# Patient Record
Sex: Male | Born: 1945 | Race: White | Hispanic: No | Smoking: Former smoker
Health system: Southern US, Community
[De-identification: ages and names within clinical notes are randomized; demographics above are authoritative.]

## PROBLEM LIST (undated history)

## (undated) DIAGNOSIS — I2510 Atherosclerotic heart disease of native coronary artery without angina pectoris: Secondary | ICD-10-CM

## (undated) DIAGNOSIS — I509 Heart failure, unspecified: Secondary | ICD-10-CM

## (undated) DIAGNOSIS — I513 Intracardiac thrombosis, not elsewhere classified: Secondary | ICD-10-CM

## (undated) DIAGNOSIS — I10 Essential (primary) hypertension: Secondary | ICD-10-CM

## (undated) DIAGNOSIS — I639 Cerebral infarction, unspecified: Secondary | ICD-10-CM

## (undated) DIAGNOSIS — D509 Iron deficiency anemia, unspecified: Secondary | ICD-10-CM

## (undated) DIAGNOSIS — N189 Chronic kidney disease, unspecified: Secondary | ICD-10-CM

## (undated) DIAGNOSIS — I255 Ischemic cardiomyopathy: Secondary | ICD-10-CM

## (undated) DIAGNOSIS — E7800 Pure hypercholesterolemia, unspecified: Secondary | ICD-10-CM

## (undated) DIAGNOSIS — I4891 Unspecified atrial fibrillation: Principal | ICD-10-CM

## (undated) HISTORY — DX: Unspecified atrial fibrillation: I48.91

## (undated) HISTORY — DX: Essential (primary) hypertension: I10

## (undated) HISTORY — PX: HERNIA REPAIR: SHX51

## (undated) HISTORY — DX: Cerebral infarction, unspecified: I63.9

## (undated) HISTORY — DX: Pure hypercholesterolemia, unspecified: E78.00

## (undated) HISTORY — PX: MASTOIDECTOMY: SHX711

## (undated) HISTORY — DX: Atherosclerotic heart disease of native coronary artery without angina pectoris: I25.10

## (undated) HISTORY — DX: Chronic kidney disease, unspecified: N18.9

## (undated) HISTORY — PX: TONSILLECTOMY: SUR1361

## (undated) HISTORY — DX: Ischemic cardiomyopathy: I25.5

---

## 1997-02-11 HISTORY — PX: CHOLECYSTECTOMY: SHX55

## 1998-02-11 HISTORY — PX: CORONARY ANGIOPLASTY WITH STENT PLACEMENT: SHX49

## 1998-02-16 ENCOUNTER — Ambulatory Visit (HOSPITAL_COMMUNITY): Admission: RE | Admit: 1998-02-16 | Discharge: 1998-02-16 | Payer: Self-pay | Admitting: Cardiology

## 1998-02-16 ENCOUNTER — Encounter: Payer: Self-pay | Admitting: Cardiology

## 1998-04-03 ENCOUNTER — Observation Stay (HOSPITAL_COMMUNITY): Admission: AD | Admit: 1998-04-03 | Discharge: 1998-04-04 | Payer: Self-pay | Admitting: Cardiovascular Disease

## 1998-09-19 ENCOUNTER — Encounter: Payer: Self-pay | Admitting: Emergency Medicine

## 1998-09-19 ENCOUNTER — Emergency Department (HOSPITAL_COMMUNITY): Admission: EM | Admit: 1998-09-19 | Discharge: 1998-09-19 | Payer: Self-pay | Admitting: Emergency Medicine

## 1998-10-20 ENCOUNTER — Observation Stay (HOSPITAL_COMMUNITY): Admission: RE | Admit: 1998-10-20 | Discharge: 1998-10-20 | Payer: Self-pay | Admitting: General Surgery

## 1998-10-20 ENCOUNTER — Encounter (INDEPENDENT_AMBULATORY_CARE_PROVIDER_SITE_OTHER): Payer: Self-pay | Admitting: Specialist

## 2000-04-11 HISTORY — PX: CORONARY ANGIOPLASTY WITH STENT PLACEMENT: SHX49

## 2000-09-18 ENCOUNTER — Encounter: Payer: Self-pay | Admitting: Orthopedic Surgery

## 2000-09-18 ENCOUNTER — Ambulatory Visit: Admission: RE | Admit: 2000-09-18 | Discharge: 2000-09-18 | Payer: Self-pay | Admitting: Orthopedic Surgery

## 2002-06-20 ENCOUNTER — Encounter: Payer: Self-pay | Admitting: Emergency Medicine

## 2002-06-20 ENCOUNTER — Emergency Department (HOSPITAL_COMMUNITY): Admission: EM | Admit: 2002-06-20 | Discharge: 2002-06-20 | Payer: Self-pay | Admitting: Emergency Medicine

## 2003-03-17 ENCOUNTER — Ambulatory Visit (HOSPITAL_COMMUNITY): Admission: RE | Admit: 2003-03-17 | Discharge: 2003-03-17 | Payer: Self-pay | Admitting: Cardiology

## 2003-03-25 ENCOUNTER — Ambulatory Visit (HOSPITAL_COMMUNITY): Admission: RE | Admit: 2003-03-25 | Discharge: 2003-03-25 | Payer: Self-pay | Admitting: Cardiology

## 2005-12-25 ENCOUNTER — Ambulatory Visit: Payer: Self-pay | Admitting: Family Medicine

## 2006-08-29 ENCOUNTER — Inpatient Hospital Stay (HOSPITAL_COMMUNITY): Admission: EM | Admit: 2006-08-29 | Discharge: 2006-08-31 | Payer: Self-pay | Admitting: Emergency Medicine

## 2006-11-26 DIAGNOSIS — I10 Essential (primary) hypertension: Secondary | ICD-10-CM | POA: Insufficient documentation

## 2007-02-12 HISTORY — PX: OTHER SURGICAL HISTORY: SHX169

## 2007-02-18 ENCOUNTER — Encounter (INDEPENDENT_AMBULATORY_CARE_PROVIDER_SITE_OTHER): Payer: Self-pay | Admitting: General Surgery

## 2007-02-18 ENCOUNTER — Ambulatory Visit (HOSPITAL_COMMUNITY): Admission: RE | Admit: 2007-02-18 | Discharge: 2007-02-18 | Payer: Self-pay | Admitting: General Surgery

## 2009-11-29 ENCOUNTER — Ambulatory Visit: Payer: Self-pay | Admitting: Cardiology

## 2010-03-26 ENCOUNTER — Ambulatory Visit (INDEPENDENT_AMBULATORY_CARE_PROVIDER_SITE_OTHER): Payer: Medicare Other | Admitting: Cardiology

## 2010-03-26 DIAGNOSIS — I2510 Atherosclerotic heart disease of native coronary artery without angina pectoris: Secondary | ICD-10-CM

## 2010-03-26 DIAGNOSIS — I209 Angina pectoris, unspecified: Secondary | ICD-10-CM

## 2010-03-26 DIAGNOSIS — I10 Essential (primary) hypertension: Secondary | ICD-10-CM

## 2010-03-29 ENCOUNTER — Ambulatory Visit
Admission: RE | Admit: 2010-03-29 | Discharge: 2010-03-29 | Disposition: A | Discharge status: Home / Self Care | Payer: Medicare Other | Source: Ambulatory Visit | Attending: Cardiology | Admitting: Cardiology

## 2010-03-29 ENCOUNTER — Other Ambulatory Visit (INDEPENDENT_AMBULATORY_CARE_PROVIDER_SITE_OTHER): Payer: Medicare Other

## 2010-03-29 ENCOUNTER — Other Ambulatory Visit: Payer: Self-pay | Admitting: Cardiology

## 2010-03-29 DIAGNOSIS — E789 Disorder of lipoprotein metabolism, unspecified: Secondary | ICD-10-CM

## 2010-03-29 DIAGNOSIS — I6350 Cerebral infarction due to unspecified occlusion or stenosis of unspecified cerebral artery: Secondary | ICD-10-CM

## 2010-03-29 DIAGNOSIS — I2510 Atherosclerotic heart disease of native coronary artery without angina pectoris: Secondary | ICD-10-CM

## 2010-03-29 DIAGNOSIS — I10 Essential (primary) hypertension: Secondary | ICD-10-CM

## 2010-03-29 DIAGNOSIS — Z01811 Encounter for preprocedural respiratory examination: Secondary | ICD-10-CM

## 2010-04-03 ENCOUNTER — Observation Stay (HOSPITAL_COMMUNITY)
Admission: RE | Admit: 2010-04-03 | Discharge: 2010-04-03 | Disposition: A | Discharge status: Home / Self Care | Payer: Medicare Other | Source: Ambulatory Visit | Attending: Cardiology | Admitting: Cardiology

## 2010-04-03 DIAGNOSIS — I2510 Atherosclerotic heart disease of native coronary artery without angina pectoris: Principal | ICD-10-CM | POA: Insufficient documentation

## 2010-04-03 DIAGNOSIS — Z9861 Coronary angioplasty status: Secondary | ICD-10-CM | POA: Insufficient documentation

## 2010-04-03 DIAGNOSIS — I209 Angina pectoris, unspecified: Secondary | ICD-10-CM | POA: Insufficient documentation

## 2010-04-11 ENCOUNTER — Ambulatory Visit (INDEPENDENT_AMBULATORY_CARE_PROVIDER_SITE_OTHER): Payer: Medicare Other | Admitting: Cardiology

## 2010-04-11 DIAGNOSIS — I2510 Atherosclerotic heart disease of native coronary artery without angina pectoris: Secondary | ICD-10-CM

## 2010-04-11 DIAGNOSIS — I10 Essential (primary) hypertension: Secondary | ICD-10-CM

## 2010-04-11 DIAGNOSIS — I209 Angina pectoris, unspecified: Secondary | ICD-10-CM

## 2010-04-12 HISTORY — PX: CORONARY ANGIOPLASTY WITH STENT PLACEMENT: SHX49

## 2010-04-26 ENCOUNTER — Other Ambulatory Visit (INDEPENDENT_AMBULATORY_CARE_PROVIDER_SITE_OTHER): Payer: Medicare Other

## 2010-04-26 DIAGNOSIS — E7800 Pure hypercholesterolemia, unspecified: Secondary | ICD-10-CM

## 2010-04-26 NOTE — Procedures (Signed)
  NAMEMATTY, VANROEKEL               ACCOUNT NO.:  [id number]  MEDICAL RECORD NO.:  [id number]           PATIENT TYPE:  I  LOCATION:  6522                         FACILITY:  [facility]  PHYSICIAN:  Rollene Rotunda, MD, FACCDATE OF BIRTH:  Dec 04, 1945  DATE OF PROCEDURE:  04/03/2010 DATE OF DISCHARGE:  04/03/2010                           CARDIAC CATHETERIZATION   PRIMARY CARE PHYSICIAN:  None.  CARDIOLOGIST:  Peter M. Sw[place]iland, MD  PROCEDURE:  Left heart catheterization/coronary arteriography.  INDICATIONS:  Evaluate patient with exertional angina and known previous coronary artery disease.  PROCEDURE NOTE:  Left heart catheterization was performed via right radial artery, the vessels cannulated using an antral wall puncture.  A 5-French radial sheath was inserted via the modified Seldinger technique.  Heparin 4000 was administered.  Verapamil 3 mg was utilized. Judkins catheters and preformed pigtail were utilized.  After the procedure, radial band was applied with 14 mL of air at 11:27.  The patient tolerated the procedure well and left lab in stable condition.  HEMODYNAMIC RESULTS:  LV 152/14, AO 151/81.  CORONARIES:  Left main had luminal irregularities.  The LAD had a proximal stent.  Prior to the stent, there were diffuse 25% lesions.  In the mid stent, there was a 90% stenosis.  The LAD was a large vessel wrapping the apex.  First diagonal was small with diffuse 80% lesions. Second diagonal was small and normal.  The ramus intermediate was moderate sized to large with long mid 80% stenosis.  The circumflex in the AV groove had luminal irregularities.  First obtuse marginal was moderate sized with diffuse luminal irregularities.  Second obtuse marginal was moderate-sized with diffuse mid to distal 80-90% stenosis. The right coronary artery was a dominant vessel.  There were mild distal luminal irregularities before the PDA.  There was posterolateral which was small with diffuse  80-90% stenosis.  PDA was moderate sized with luminal irregularities.  LEFT VENTRICULOGRAM:  The left ventriculogram was obtained in the RAO projection.  The EF was 65% with normal wall motion.  CONCLUSION:  Large vessel coronary artery disease involving predominantly the ramus intermediate and the left anterior descending. He also has some small-vessel disease involving diagonal and posterolateral and obtuse marginal.  His ejection fraction is well preserved.  PLAN:  Dr. Swaziland will be talking to the patient about bypass surgery versus PCI.     Rollene Rotunda, MD, [facility]     JH/MEDQ  D:  04/03/2010  T:  04/04/2010  Job:  [id number]  Electronically Signed by Rollene Rotunda MD [facility] on 04/26/2010 07:56:40 PM

## 2010-05-01 ENCOUNTER — Observation Stay (HOSPITAL_COMMUNITY)
Admission: RE | Admit: 2010-05-01 | Discharge: 2010-05-02 | Disposition: A | Discharge status: Home / Self Care | Payer: Medicare Other | Source: Ambulatory Visit | Attending: Cardiology | Admitting: Cardiology

## 2010-05-01 DIAGNOSIS — I209 Angina pectoris, unspecified: Secondary | ICD-10-CM | POA: Insufficient documentation

## 2010-05-01 DIAGNOSIS — Z8673 Personal history of transient ischemic attack (TIA), and cerebral infarction without residual deficits: Secondary | ICD-10-CM | POA: Insufficient documentation

## 2010-05-01 DIAGNOSIS — N183 Chronic kidney disease, stage 3 unspecified: Secondary | ICD-10-CM | POA: Insufficient documentation

## 2010-05-01 DIAGNOSIS — E785 Hyperlipidemia, unspecified: Secondary | ICD-10-CM | POA: Insufficient documentation

## 2010-05-01 DIAGNOSIS — Y849 Medical procedure, unspecified as the cause of abnormal reaction of the patient, or of later complication, without mention of misadventure at the time of the procedure: Secondary | ICD-10-CM | POA: Insufficient documentation

## 2010-05-01 DIAGNOSIS — Z23 Encounter for immunization: Secondary | ICD-10-CM | POA: Insufficient documentation

## 2010-05-01 DIAGNOSIS — I129 Hypertensive chronic kidney disease with stage 1 through stage 4 chronic kidney disease, or unspecified chronic kidney disease: Secondary | ICD-10-CM | POA: Insufficient documentation

## 2010-05-01 DIAGNOSIS — I2510 Atherosclerotic heart disease of native coronary artery without angina pectoris: Principal | ICD-10-CM | POA: Insufficient documentation

## 2010-05-01 LAB — POCT ACTIVATED CLOTTING TIME: Activated Clotting Time: 541 seconds

## 2010-05-02 DIAGNOSIS — I200 Unstable angina: Secondary | ICD-10-CM

## 2010-05-02 LAB — BASIC METABOLIC PANEL
Calcium: 9.3 mg/dL (ref 8.4–10.5)
Chloride: 102 mEq/L (ref 96–112)
Creatinine, Ser: 1.53 mg/dL — ABNORMAL HIGH (ref 0.4–1.5)
GFR calc Af Amer: 56 mL/min — ABNORMAL LOW (ref >60)
Sodium: 136 mEq/L (ref 135–145)

## 2010-05-02 LAB — CBC
Hemoglobin: 12.6 g/dL — ABNORMAL LOW (ref 13.0–17.0)
MCH: 29.8 pg (ref 26.0–34.0)
MCHC: 33.5 g/dL (ref 30.0–36.0)
Platelets: 165 10*3/uL (ref 150–400)
RBC: 4.23 MIL/uL (ref 4.22–5.81)

## 2010-05-03 NOTE — Procedures (Signed)
NAMEFAUSTO, Erik Adams               ACCOUNT NO.:  [id number]  MEDICAL RECORD NO.:  [id number]           PATIENT TYPE:  O  LOCATION:  6525                         FACILITY:  [facility]  PHYSICIAN:  Imad Shostak M. [place], M.D.  DATE OF BIRTH:  04/30/1945  DATE OF PROCEDURE:  05/01/2010 DATE OF DISCHARGE:                           CARDIAC CATHETERIZATION   INDICATIONS FOR PROCEDURE:  A 65 year old white male with history of coronary artery disease and remote stenting of the mid LAD in 2000 with a bare-metal stent.  He presented recently with increasing anginal symptoms and was found to have significant progressive coronary disease including in-stent restenosis in the mid LAD.  He also had a long 80% to 90% stenosis of the ramus intermediate branch.  The first diagonal was severely and diffusely diseased up to 90%.  It was small in caliber and not felt to be amenable to intervention.  The PDA was also severely diffusely diseased but not amenable to intervention.  After discussion of the patient's options, he elected to undergo percutaneous intervention of the mid LAD and the ramus intermediate branch.  ACCESS:  Via the right radial artery using the standard Seldinger technique.  EQUIPMENT:  A 6-French [place] guide, 6-French arterial sheath, a [place] wire.  For the LAD, we used a 3.0- x 15-mm Sprinter Legend balloon, a 3.5- x 32-mm Ion stent, a 3.75- x 21-mm [place] Sprinter balloon, and a 4.0- x 8-mm [place] TREK balloon.  For the ramus intermediate branch, we used a 6- [place] left Voda 4 guide and a BMW wire.  We also used a 2.5- x 20-mm Sprinter balloon, a 2.5- x 28-mm Ion stent, a 2.5- x 24-mm Ion stent, and a 2.75- x 21-mm [place] Sprinter balloon.  MEDICATIONS:  Local anesthesia with 1% Xylocaine.  The patient was started on sodium bicarbonate drip prior to the intervention.  This was maintained throughout the procedure.  He received Versed 2 mg IV and a total of 100 mcg of fentanyl IV.  Angiomax bolus  at 0.75 mg/kg followed by continuous infusion of 1.75 mg/kg per hour, labetalol 20 mg IV x1.  CONTRAST:  Omnipaque 190 mL.  INTERVENTIONAL PROCEDURE NOTE:  We initially addressed the mid LAD stenosis.  This was a 90% in-stent restenosis in a fairly long segment of the mid LAD.  We were able to cross this lesion easily with the wire and predilated with a 3.0- x 15-mm [place]  Sprinter balloon up to 11 atmospheres.  We then placed a 3.5- x 32-mm Ion stent and deployed this at 11 atmospheres with the stent balloon.  We postdilated the entire stent with a 3.75- x 21-mm [place] Sprinter up to 11 atmospheres distally and 16 atmospheres proximally.  There still appeared to be a small segment proximally that was under deployed and we postdilated this with a 4.0- x 8-mm [place] TREK balloon up to 17 atmospheres.  This yielded an excellent angiographic result with 0% residual stenosis and TIMI grade 3 flow. The first diagonal branch was not compromised.  We next addressed the ramus intermediate branch.  This vessel had a long segment of disease from the  proximal to mid vessel.  It was tortuous. We initially crossed and dilated with the 2.5- x 20-mm [place] Sprinter balloon up to 11 atmospheres.  However, we had inadequate wire and guide support to place a stent.  We switched to a 6-French left Voda 4 guide and a BMW wire.  We recrossed the lesion without difficulty.  We were then able to stent the more distal segment with a 2.5- x 28-mm Ion stent that was deployed at 11 atmospheres.  The more proximal segment was stented with a 2.5- x 24-mm Ion stent also deployed at 11 atmospheres. The entire stented segment was postdilated with a 2.75- x 21-mm [place] Sprinter balloon up to 14 atmospheres.  This yielded an excellent angiographic result with 0% residual stenosis and TIMI grade 3 flow. The patient was pain free at this point.  IMPRESSION:  Successful drug-eluting stent deployment in the mid left anterior descending and  proximal to mid ramus intermediate branches.  PLAN:  We will continue on aspirin and Plavix indefinitely.          ______________________________ Jacobs Golab M. [place], M.D.     PMJ/MEDQ  D:  05/01/2010  T:  05/02/2010  Job:  [id number]  Electronically Signed by Walburga Hudman [place] M.D. on 05/03/2010 03:41:16 PM

## 2010-05-15 ENCOUNTER — Other Ambulatory Visit: Payer: Self-pay | Admitting: Dermatology

## 2010-05-16 ENCOUNTER — Ambulatory Visit: Payer: Medicare Other | Admitting: Nurse Practitioner

## 2010-05-18 ENCOUNTER — Ambulatory Visit: Payer: Medicare Other | Admitting: Nurse Practitioner

## 2010-05-21 ENCOUNTER — Encounter: Payer: Self-pay | Admitting: Nurse Practitioner

## 2010-05-25 ENCOUNTER — Encounter: Payer: Self-pay | Admitting: Nurse Practitioner

## 2010-05-25 ENCOUNTER — Ambulatory Visit (INDEPENDENT_AMBULATORY_CARE_PROVIDER_SITE_OTHER): Payer: Medicare Other | Admitting: Nurse Practitioner

## 2010-05-25 VITALS — BP 126/90 | HR 68 | Wt 177.2 lb

## 2010-05-25 DIAGNOSIS — I2510 Atherosclerotic heart disease of native coronary artery without angina pectoris: Secondary | ICD-10-CM

## 2010-05-25 DIAGNOSIS — N183 Chronic kidney disease, stage 3 unspecified: Secondary | ICD-10-CM | POA: Insufficient documentation

## 2010-05-25 DIAGNOSIS — E7800 Pure hypercholesterolemia, unspecified: Secondary | ICD-10-CM

## 2010-05-25 DIAGNOSIS — I10 Essential (primary) hypertension: Secondary | ICD-10-CM

## 2010-05-25 DIAGNOSIS — N189 Chronic kidney disease, unspecified: Secondary | ICD-10-CM

## 2010-05-25 DIAGNOSIS — Z955 Presence of coronary angioplasty implant and graft: Secondary | ICD-10-CM | POA: Insufficient documentation

## 2010-05-25 DIAGNOSIS — Z95820 Peripheral vascular angioplasty status with implants and grafts: Secondary | ICD-10-CM

## 2010-05-25 DIAGNOSIS — Z9861 Coronary angioplasty status: Secondary | ICD-10-CM

## 2010-05-25 LAB — CBC WITH DIFFERENTIAL/PLATELET
Basophils Absolute: 0 10*3/uL (ref 0.0–0.1)
Basophils Relative: 0.4 % (ref 0.0–3.0)
Eosinophils Absolute: 0.2 10*3/uL (ref 0.0–0.7)
Eosinophils Relative: 3.1 % (ref 0.0–5.0)
HCT: 37 % — ABNORMAL LOW (ref 39.0–52.0)
Hemoglobin: 12.7 g/dL — ABNORMAL LOW (ref 13.0–17.0)
Lymphocytes Relative: 19.2 % (ref 12.0–46.0)
Lymphs Abs: 1.3 10*3/uL (ref 0.7–4.0)
MCHC: 34.3 g/dL (ref 30.0–36.0)
MCV: 92.9 fl (ref 78.0–100.0)
Monocytes Absolute: 0.5 10*3/uL (ref 0.1–1.0)
Monocytes Relative: 7.7 % (ref 3.0–12.0)
Neutro Abs: 4.7 10*3/uL (ref 1.4–7.7)
Neutrophils Relative %: 69.6 % (ref 43.0–77.0)
Platelets: 200 10*3/uL (ref 150.0–400.0)
RBC: 3.98 Mil/uL — ABNORMAL LOW (ref 4.22–5.81)
RDW: 13.7 % (ref 11.5–14.6)
WBC: 6.7 10*3/uL (ref 4.5–10.5)

## 2010-05-25 LAB — BASIC METABOLIC PANEL
BUN: 32 mg/dL — ABNORMAL HIGH (ref 6–23)
CO2: 27 mEq/L (ref 19–32)
Calcium: 9.3 mg/dL (ref 8.4–10.5)
Chloride: 103 mEq/L (ref 96–112)
Creatinine, Ser: 1.7 mg/dL — ABNORMAL HIGH (ref 0.4–1.5)
GFR: 44.33 mL/min — ABNORMAL LOW (ref >60.00)
Glucose, Bld: 97 mg/dL (ref 70–99)
Potassium: 4.9 mEq/L (ref 3.5–5.1)
Sodium: 138 mEq/L (ref 135–145)

## 2010-05-25 NOTE — Assessment & Plan Note (Signed)
He is currently doing well and not having any more chest discomfort. I explained to him that sometimes following PCI there can be some residual chest discomforts. If he were to have recurrent symptoms in the future, he needs to use his NTG and be in touch with [place].

## 2010-05-25 NOTE — Assessment & Plan Note (Signed)
We will recheck  BMET today.

## 2010-05-25 NOTE — Assessment & Plan Note (Signed)
We will keep him on his current regimen.

## 2010-05-25 NOTE — Progress Notes (Signed)
[name] Date of Birth: 1945/04/13   History of Present Illness: Mr. Tursi is seen today for a 2 week follow up visit. He is seen for Dr. [place]il[place]. He has had recent 2 vessel PCI. He remains on Plavix and aspirin. He is now feeling good. He notes that for the first few days after discharge, he had chest soreness. He used a couple of NTG with relief. He did not call and alert anyone. He is now back walking and is almost back to walking his 3 miles. He is not having any chest pain now. He feels good. He is tolerating his medicines. He has CRI and we will recheck his BUN & creatinine today.   Current Outpatient Prescriptions on File Prior to Visit  Medication Sig Dispense Refill  . Amlodipine-Valsartan-HCTZ (EXFORGE HCT) 10-320-25 MG TABS Take by mouth daily.        Marland Kitchen aspirin 325 MG EC tablet Take 325 mg by mouth daily.        . clopidogrel (PLAVIX) 75 MG tablet Take 75 mg by mouth daily.        Marland Kitchen labetalol (NORMODYNE) 200 MG tablet Take 200 mg by mouth. Taking 2 tablets BID       . nitroGLYCERIN (NITROSTAT) 0.4 MG SL tablet Place 0.4 mg under the tongue every 5 (five) minutes as needed.        Marland Kitchen DISCONTD: rosuvastatin (CRESTOR) 20 MG tablet Take 10 mg by mouth daily.         Allergies  Allergen Reactions  . Ace Inhibitors   . Clonidine Derivatives     Past Medical History  Diagnosis Date  . CAD (coronary artery disease)   . HTN (hypertension)   . Hypercholesteremia   . CVA (cerebral infarction)   . CRI (chronic renal insufficiency)     Baseline creatinine of 1.4 to 1.7    Past Surgical History  Procedure Date  . Coronary angioplasty with stent placement 2000    BMS to the LAD  . Mastoidectomy   . Coronary angioplasty with stent placement March 2012    DES to LAD and Ramus intermdius    History  Smoking status  . Former Smoker  . Quit date: 02/11/1974  Smokeless tobacco  . Never Used    History  Alcohol Use No    Family History  Problem Relation Age of Onset    . Heart disease Father     Review of Systems: The review of systems is positive for mild bruising. He has had no problems with his cath site (right arm).  All other systems were reviewed and are negative.  Physical Exam: BP 126/90  Pulse 68  Wt 177 lb 3.2 oz (80.377 kg) Patient is very pleasant and in no acute distress. Skin is warm and dry. Color is normal.  HEENT is unremarkable. Normocephalic/atraumatic. PERRL. Sclera are nonicteric. Neck is supple. No masses. No JVD. Lungs are clear. Cardiac exam shows a regular rate and rhythm. Abdomen is soft. Extremities are without edema. His right arm looks great. Gait and ROM are intact. No gross neurologic deficits noted.   LABORATORY DATA: BMET and CBC are pending.   Assessment / Plan:

## 2010-05-25 NOTE — Patient Instructions (Signed)
Watch your weight. Regular exercise for 45 minutes each day is recommended. Heart Healthy Diet is recommended.  Stay on ;your current medicines. We are going to check your kidney function today.  I will have you see Dr. Swaziland in 6 weeks.  Call for any recurrent chest pain. You may use your NTG if needed.

## 2010-05-25 NOTE — Assessment & Plan Note (Signed)
Samples of his Crestor are given today.

## 2010-05-29 ENCOUNTER — Telehealth: Payer: Self-pay | Admitting: *Deleted

## 2010-05-29 NOTE — Telephone Encounter (Signed)
LM w/ lab results 

## 2010-05-29 NOTE — Discharge Summary (Signed)
Erik Adams, Erik Adams               ACCOUNT NO.:  [id number]  MEDICAL RECORD NO.:  [id number]           PATIENT TYPE:  O  LOCATION:  6525                         FACILITY:  [facility]  PHYSICIAN:  Victora Irby M. [place], M.D.  DATE OF BIRTH:  1945-07-30  DATE OF ADMISSION:  05/01/2010 DATE OF DISCHARGE:  05/02/2010                              DISCHARGE SUMMARY   PRIMARY CARDIOLOGIST:  Karon Heckendorn M. [place], MD  DISCHARGE DIAGNOSIS:  Unstable angina.  SECONDARY DIAGNOSES: 1. Coronary artery disease, status post successful drug-eluting stent     placement of the left anterior descending artery and ramus     intermedius this admission. 2. Hypertension. 3. Hyperlipidemia. 4. Prior cerebrovascular accident. 5. Stage III chronic kidney disease, baseline creatinine 1.4-1.7. 6. Status post mastoidectomy.  ALLERGIES:  Intolerance to CLONIDINE and ACE INHIBITORS.  PROCEDURES:  Successful PCI and stenting of the LAD secondary to in- stent restenosis with placement of 3.5 x 32-mm ION drug-eluting stent. Successful PCI and stenting of the distal ramus intermedius with placement to 0.5 x 28-mm ION drug-eluting stent.  The proximal ramus intermedius was stented with a 2.5 x 24-mm ION drug-eluting stent.  HISTORY OF PRESENT ILLNESS:  A 65 year old male with prior history of coronary artery disease, status post bare-metal stenting of the LAD in 2000.  The patient recently developed exertional angina, was seen by Dr. [place]iland, set up a catheterization which took place on April 04, 2010. This showed significant multivessel disease including in-stent restenosis within the LAD up to approximately 90%, as well as diffuse 80- 90% stenosis throughout a ramus intermedius.  The patient also has significant small vessel disease involving the diagonal, obtuse marginal and RPL.  EF was 65%.  The patient followed up with Dr. [place] in the office on April 11, 2010, and because of ongoing symptoms, decision was made  to pursue PCI of the LAD and ramus.  HOSPITAL COURSE:  The patient was presented to the [facility] on May 01, 2010, where he underwent successful PCI and drug-eluting stent placement within the mid LAD using a 3.5 x 32-mm ION drug-eluting stent.  Attention was then turned to the ramus with placement of 2.5 x 28-mm ION drug-eluting stent in the distal portion of the vessel and a 2.5 x 24-mm ION drug-eluting stent in the proximal ramus intermedius. The patient tolerated this procedure well and postprocedure, has been ambulating without recurrent symptoms or limitations.  He has been hypertensive and has been treated with an additional dose of Exforge this morning.  The plan to discharge him home today in good condition.  DISCHARGE LABS:  Hemoglobin 12.6, hematocrit 37.6, WBC 8.9, platelets 165.  Sodium 136, potassium 3.8, chloride 102, CO2 26, BUN 22, creatinine 1.53, glucose 109, calcium 9.3.  DISPOSITION:  The patient will be discharged home today in good condition.  FOLLOWUP APPOINTMENTS:  We have arranged follow up with Norma Fredrickson, nurse practitioner at [facility] on May 16, 2010 at 10 a.m.  DISCHARGE MEDICATIONS: 1. Nitroglycerin 0.4 mg sublingual p.r.n. chest pain. 2. Labetalol 200 mg two tablets b.i.d. 3. Aspirin 325 mg daily. 4. Crestor 20  mg daily. 5. Exforge HCT 10/320/25 one tablet daily. 6. Plavix 75 mg daily.  OUTSTANDING LABORATORY STUDIES:  None.  DURATION OF DISCHARGE ENCOUNTER:  Forty minutes including physician time.     [name], ANP   ______________________________ Brant Peets M. [place], M.D.    CB/MEDQ  D:  05/02/2010  T:  05/03/2010  Job:  [id number]  Electronically Signed by [name] ANP on 05/22/2010 04:04:43 PM Electronically Signed by Monti Villers [place] M.D. on 05/29/2010 03:13:45 PM

## 2010-05-29 NOTE — Telephone Encounter (Signed)
Called back and gave results of lab work. Sent to Dr. Scotty Court.

## 2010-05-29 NOTE — Telephone Encounter (Signed)
Message copied by Murrell Redden on Tue May 29, 2010  2:21 PM ------      Message from: Norma Fredrickson      Created: Tue May 29, 2010  8:10 AM       Ok to report. Labs are satisfactory. Has chronic renal insufficiency. Avoid NSAIDS. Continue with same medicines.

## 2010-05-29 NOTE — Telephone Encounter (Signed)
Message copied by Murrell Redden on Tue May 29, 2010 10:12 AM ------      Message from: Norma Fredrickson      Created: Tue May 29, 2010  8:10 AM       Ok to report. Labs are satisfactory. Has chronic renal insufficiency. Avoid NSAIDS. Continue with same medicines.

## 2010-06-26 NOTE — Op Note (Signed)
Erik Adams, Erik Adams               ACCOUNT NO.:  [id number]   MEDICAL RECORD NO.:  [id number]          PATIENT TYPE:  AMB   LOCATION:  DAY                           FACILITY:  [facility]   PHYSICIAN:  Barbaraann Barthel, M.D. DATE OF BIRTH:  1945-09-09   DATE OF PROCEDURE:  02/18/2007  DATE OF DISCHARGE:                               OPERATIVE REPORT   SURGEON:  Dr. Malvin Johns.   PREOPERATIVE DIAGNOSES:  Left inguinal hernia.   POSTOPERATIVE DIAGNOSES:  Left inguinal hernia.   PROCEDURE:  Left inguinal herniorrhaphy (no mesh used, modified McVay  repair).   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   Note this is a 65 year old white male who had a moderately sized  left  inguinal hernia which was causing him more and more discomfort. This was  non-incarcerated and we discussed elective repair after cardiac  clearance. Cardiac clearance was obtained in [place] and we had  scheduled elective surgery.  We discussed complications not limited to  but including bleeding, infection and recurrence.  Informed consent was  obtained.   GROSS OPERATIVE FINDINGS:  Consistent with indirect and a direct  inguinal hernia.  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   TECHNIQUE:  The patient was placed in supine position after the adequate  administration of spinal anesthesia.  Prior to prepping a Foley catheter  was aseptically inserted.  After prepping with Betadine solution and  draping in the usual manner, an incision was carried out between the  anterior-superior iliac spine and the pubic tubercle through skin,  subcutaneous tissue, Scarpa's layer and opening the external oblique  through the external ring.  The ilioinguinal nerve was identified.  This  was carefully preserved. The cords structures were then dissected free  from the hernia sac. The hernia sac was then doubly ligated under direct  vision and amputated and we then repaired the direct  defect suturing  transversus fascia and transversalis abdominis to Cooper's ligament and  Poupart's ligament with interrupted 2-0 Bralon sutures.  Prior to  cinching these, a relaxing incision was carried out.  We then used  approximately 10 mL of 1/2% Sensorcaine to help with postoperative  comfort. The cord structures and the ilioinguinal nerve were returned to  their anatomic position and the external oblique was repaired over the  cord structures with a running 3-0 Polysorb suture.  The wound was then  irrigated, we checked for hemostasis.  The patient had a good result.  He coughed, there was a good repair noted.  We then closed the skin with  a stapling  device and a sterile dressing was applied.  Prior to closure, all  sponge, needle and instrument counts were found to be correct.  Estimated blood loss was minimal.  The patient received a liter of  crystalloids intraoperatively.  There were no complications.      Barbaraann Barthel, M.D.  Electronically Signed     WB/MEDQ  D:  02/18/2007  T:  02/18/2007  Job:  [id number]   cc:   [facility]

## 2010-06-26 NOTE — Discharge Summary (Signed)
NAMEDAMARCO, KEYSOR NO.:  [id number]   MEDICAL RECORD NO.:  [id number]          PATIENT TYPE:  INP   LOCATION:  1224                         FACILITY[facility]   PHYSICIAN:  Peter M. [place]iland, M.D.  DATE OF BIRTH:  11-10-1945   DATE OF ADMISSION:  08/29/2006  DATE OF DISCHARGE:  08/31/2006                               DISCHARGE SUMMARY   HISTORY OF PRESENT ILLNESS:  Erik Adams is a 65 year old white male with  long-standing history of severe hypertension.  He has been refractory to  a number of medications.  He has recently experienced accelerated  hypertension with symptoms of dizziness and some left sided weakness.  Despite adjustment in his outpatient regimen, he has continued to have  accelerated hypertension.  On the night of admission the patient's blood  pressure went up to as high as 273 on his home monitor.  Upon  presentation to the emergency room, his blood pressure was 250/122.  He  was really not having any significant symptoms of dizzy, new neurologic  symptoms, chest pain, or shortness of breath at this time.  He was  admitted for further management of his severe hypertension.   For details of his past medical history, social history, family history,  and physical exam, please see admission history and physical.   LABORATORY DATA:  His ECG shows a normal sinus rhythm.  He has LVH with  repolarization abnormality.  His hemoglobin is 13.8, hematocrit 40,  white count is 9,600, platelets 206,00.  Sodium 135, potassium 4.4,  chloride 102, CO2 24, BUN 37, creatinine 1.72, glucose of 115.  Followup  renal function the following day showed a BUN of 30 and creatinine 1.75.  His serial cardiac enzymes showed mild elevation with a CK of 244 with  9.9 MB, then 233 with 9 MB, and then CPK of 214 with 5.4 MB.  Troponin  went from 0.08, to 0.07, to 0.06.  Calcium was normal.   HOSPITAL COURSE:  The patient was admitted to the stepdown unit.  He was  treated  with IV labetalol.  He received a total of 80 mg and IV boluses  in the emergency department with some reduction in his blood pressure.  He was placed on IV labetalol 2 mg/min IV.  This resulted in significant  improvement in his blood pressure with a drop down to 130/84, at which  point he felt lightheaded and woozy.  The labetalol drip was  discontinued, and he was placed on an oral dose of labetalol 400 mg  b.i.d.  He still showed some fluctuation of his blood pressure as high  as 180 systolic but overall his blood pressure had improved  significantly and at the time of discharge was 146/98.  His pulse rate  remained stable.  He had no chest pain at all.  It was felt that his  mild elevation of cardiac enzymes was due to increased myocardial stress  from his severe hypertension.  He felt well on the new medication and  was discharged home in stable condition on August 31, 2006.   DISCHARGE DIAGNOSES:  1. Accelerated hypertension, poorly controlled.  2. Mild chronic renal insufficiency.  3. Hypercholesterolemia.  4. Prior cerebrovascular accident.  5. Remote history of coronary artery disease with stenting of the left      anterior descending artery in 2000.   DISCHARGE MEDICATIONS:  1. Aspirin 325 mg per day.  2. Exforge 10/320 mg per day.  3. Aldactone 25 mg per day.  4. Crestor 10 mg per day.  5. Labetalol 400 mg twice a day.  The patient is instructed to take an      extra dose if he has a blood pressure greater than 200 systolic.   The patient is to remain on a low sodium diet.  He will follow up with  Dr. [place]and on  July 30th.  He is to increase his activity slowly.   DISCHARGE STATUS:  Improved.           ______________________________  Peter M. [place], M.D.     PMJ/MEDQ  D:  08/31/2006  T:  08/31/2006  Job:  [id number]

## 2010-06-26 NOTE — H&P (Signed)
NAME:  Erik Adams, CAMMARATA NO.:  [id number]   MEDICAL RECORD NO.:  [id number]          PATIENT TYPE:  INP   LOCATION:  0104                         FACILITY:  [facility]   PHYSICIAN:  Peter M. Swaziland, M.D.  DATE OF BIRTH:  May 16, 1945   DATE OF ADMISSION:  08/29/2006  DATE OF DISCHARGE:                              HISTORY & PHYSICAL   HISTORY OF PRESENT ILLNESS:  Mr. Okray is a pleasant 65 year old white  male who has a history of chronic severe hypertension.  This has been  refractory to a number of medications and he has had numerous drug  intolerances.  He was seen on August 12, 2006, with symptoms of acute  dizziness, nausea and some numbness in his left face and leg.  His blood  pressure was elevated at that time at 208/120.  At that time we added  Aldactone to his medical regimen and Coreg.  We also placed him on  Crestor for his hyperlipidemia.  We subsequently saw him August 21, 2006.  He had stopped the Coreg due to the fact that it made him feel extremely  fatigued with exertion.  His blood pressure at that time was 190/130,  his dizziness had resolved, and his left-sided numbness had also  resolved.  Again we adjusted his blood pressure medication.  We switched  him to Exforge 5/320 mg per day and later this was increased a 10/320 mg  per day.  We continued with Aldactone and added Bystolic at 5 mg per  day.  Last night the patient's blood pressure increased severely.  He  states his systolic pressure increased to 273.  This morning his blood  pressure was still elevated at 250/135 and he presented to the emergency  department.  Other than feeling mildly tremulous and having some vague  substernal chest pain, he has been asymptomatic.  He denies any  headache, visual changes, new neurologic symptoms, shortness of breath.  Urinary output has been normal.  Initially his blood pressure in the  emergency department was 250/122.  He is given a total of 80 mg of IV  labetalol over several hours.  His blood pressures declined to 187/116.   PAST MEDICAL HISTORY:  1. Significant for severe chronic hypertension since his teenage      years.  2. He has a history of mild chronic renal insufficiency.  3. History of hypercholesterolemia.  4. He has had a prior stroke in 2002 associated with right hand      weakness and verbal scrambling.  5. He has had previous mastoidectomy.  6. He has a history of coronary artery disease and had a remote stent      of the LAD in 2000.  He had a cardiac catheterization in 2005 which      showed only nonobstructive disease.   ALLERGIES:  At various times the patient has been intolerant to beta  blockers, clonidine, ACE inhibitors and calcium channel blockers.   SOCIAL HISTORY:  He is an Nutritional therapist.  He is married.  He has 5  children.  He is a nonsmoker.  FAMILY HISTORY:  His father died at age 11 with coronary disease.   His medications include:  1. Aspirin 325 mg per day.  2. Exforge 10/320 mg per day.  3. Aldactone 25 mg per day.  4. Bystolic 5 mg per day.  5. Crestor 10 mg per day.   Review of systems is as noted in HPI, otherwise negative.   PHYSICAL EXAMINATION:  The patient is a pleasant white male in no  distress.  His blood pressure is 187/112, pulse is 72, is sinus rhythm.  He is  afebrile.  HEENT:  His pupils are equal, round and reactive to light and  accommodation.  Extraocular movements were full.  His fundi reveal  chronic hypertensive changes without papilledema.  Oropharynx is clear.  NECK:  Supple without JVD, adenopathy, thyromegaly or bruits.  LUNGS:  Clear.  CARDIAC:  A regular rate and rhythm with positive S4.  There is no S3 or  murmur.  ABDOMEN:  Soft and nontender.  He has no bruits or masses.  EXTREMITIES:  Without edema or cyanosis.  He has no phlebitis.  Pulses  are 2+ and symmetric.  NEUROLOGIC:  He is alert and oriented x4.  Cranial nerves II-XII are  intact.  He has  no focal motor or sensory deficits   LABORATORY DATA:  ECG shows normal sinus rhythm with LVH and  repolarization abnormality.  White count is 9600, hemoglobin 13.8,  hematocrit 40.0, platelets 202,000.  Sodium is 135, potassium 4.4,  chloride 102, CO2 is 24, BUN 37, creatinine 1.72 glucose 115.  CK-MBs  initially were 5.8 and 8.0, subsequent total CK was 244 with 9.9 MB.  Troponins were 0.06, less than 0.05, and then 0.08.   IMPRESSION:  1. Severe accelerated hypertension refractory to outpatient medical      therapy.  2. Coronary disease with remote stenting of the left anterior      descending artery.  3. Prior cerebrovascular accident.  4. Hypercholesterolemia.  5. Chronic renal insufficiency.   PLAN:  The patient will be admitted to step-down unit.  We will continue  his doses of Norvasc, Diovan, Aldactone and Crestor.  He will be  maintained on IV labetalol drip until blood pressure control is achieved  and then plan on switching him to an oral dose.           ______________________________  Peter M. Swaziland, M.D.     PMJ/MEDQ  D:  08/29/2006  T:  08/30/2006  Job:  [id number]

## 2010-06-29 NOTE — Cardiovascular Report (Signed)
NAMEJACAI, KIPP NO.:  [id number]   MEDICAL RECORD NO.:  [id number]                   PATIENT TYPE:  OIB   LOCATION:  2861                                 FACILITY:  [facility]   PHYSICIAN:  Peter M. Swaziland, M.D.               DATE OF BIRTH:  [birth year]   DATE OF PROCEDURE:  03/25/2003  DATE OF DISCHARGE:  03/25/2003                              CARDIAC CATHETERIZATION   INDICATION FOR PROCEDURE:  A 65 year old white male with history of severe  hypertension, history of coronary disease status post stenting of the mid  LAD in 2000.  Presents with recent symptoms of chest pain.  Adenosine  Cardiolite study is abnormal suggesting anteroseptal ischemia and EF of 39%.   ACCESS:  Via the right femoral artery using standard Seldinger technique.   EQUIPMENT:  6 French 4 cm right and left Judkins catheter, 6 French pigtail  catheter, 6 French arterial sheath.   MEDICATIONS:  Local anesthesia with 1% Xylocaine, labetalol 20 mg IV x2.   CONTRAST:  125 mL of Omnipaque.   HEMODYNAMIC DATA:  Aortic pressure 195/108 with a mean of 143.  Left  ventricular pressure is 198 with an EDP of 17 mmHg.   ANGIOGRAPHIC DATA:  The left coronary artery arises and distributes  normally.  The left main coronary artery is normal.   The left anterior descending artery is calcified.  At site of prior stent,  there is less than or equal to 10% residual stenosis.  No other significant  disease is noted.   The left circumflex coronary artery is a large vessel.  It gives rise to  three marginal branches.  The second marginal branch demonstrates 50%  narrowing in the mid to distal vessel.   The right coronary artery is a large dominant vessel.  It has less than 10%  irregularities.   LEFT VENTRICULAR ANGIOGRAPHY:  Performed in the RAO view.  This demonstrates  normal left ventricular size and contractility with normal systolic  function.  Ejection fraction is estimated at 60%.   There is no significant  mitral insufficiency.  There is some posterior mitral annular calcification.  The aortic valve appears normal.  There is mild dilatation of the aortic  root.   FINAL INTERPRETATION:  1. Minor nonobstructive atherosclerotic coronary artery disease.  2. Continued long term patency of the prior stent in the left anterior     descending.  3. Normal left ventricular function.                                               Peter M. Swaziland, M.D.   PMJ/MEDQ  D:  03/25/2003  T:  03/26/2003  Job:  [id number]

## 2010-06-29 NOTE — H&P (Signed)
NAME:  Erik, Adams NO.:  [id number]   MEDICAL RECORD NO.:  [id number]                   PATIENT TYPE:  OIB   LOCATION:                                       FACILITY:  [facility]   PHYSICIAN:  Peter M. Swaziland, M.D.               DATE OF BIRTH:  1945-05-15   DATE OF ADMISSION:  03/25/2003  DATE OF DISCHARGE:                                HISTORY & PHYSICAL   HISTORY OF PRESENT ILLNESS:  [name] is a 65 year old white male with  history of longstanding severe hypertension, history of coronary artery  disease, and prior CVA who presents with recent episodes of chest pain.  First episode occurred at night after he had gotten up to the bathroom.  This was associated with severe pounding in his chest.  He had pain in the  anterior precordium radiating to his left arm associated with shortness of  breath and palpitations.  He subsequently underwent an adenosine Cardiolite  study which showed evidence of anterior septal ischemia and ejection  fraction of 39%.  He is now admitted for cardiac catheterization.  The  patient is status post prior intervention with stenting of the mid LAD in  February of 2000 with a 3.5 x 16 mm NIR Primo stent.  His other vessels at  that time were without significant disease.   PAST MEDICAL HISTORY:  1. Severe uncontrolled hypertension.  2. Coronary artery disease.  3. Status post CVA in 2002 associated with scrambling of his words and right     hand clumsiness.  4. Status post mastoidectomy as a child.  5. Mild renal insufficiency.   MEDICATIONS:  1. Reserpine 0.25 mg b.i.d.  2. Aspirin 325 mg per day.  3. Diovan HCT 160/12.5 mg daily.  4. Butisol 1 mg daily.   ALLERGIES:  The patient is intolerant to multiple antihypertensive therapies  including BETA-BLOCKERS, CALCIUM CHANNEL BLOCKERS, ACE INHIBITORS and  CLONIDINE.   SOCIAL HISTORY:  The patient is an Nutritional therapist.  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  His  wife has Alzheimer's.  He is a nonsmoker.   FAMILY HISTORY:  Father died at age 70 of coronary disease.   REVIEW OF SYMPTOMS:  As noted in HPI, otherwise negative.   PHYSICAL EXAMINATION:  GENERAL APPEARANCE:  The patient is a well-developed  white male in no apparent distress.  VITAL SIGNS:  Blood pressure 170/110, pulse 84 and regular,  weight 178,  respirations are normal.  HEENT:  He has mild facial flushing.  HEENT examination is otherwise  unremarkable.  Pupils are equal, round and reactive to light.  Conjunctivae  clear.  Oropharynx clear.  NECK:  Without JVD, adenopathy, thyromegaly or bruits.  LUNGS:  Clear to auscultation and percussion.  CARDIOVASCULAR:  Regular rate and rhythm, normal S1 and S2 without murmurs,  gallops, rubs, or clicks.  ABDOMEN:  Soft and  nontender.  He has no hepatosplenomegaly, masses or  bruits.  EXTREMITIES:  Femoral and pedal pulses are 2+ and symmetric.  NEUROLOGIC:  Intact.   LABORATORY DATA:  ECG shows normal sinus rhythm, left anterior fascicular  block.  There is left ventricular hypertrophy with strain.   Chest x-ray shows no active disease.   IMPRESSION:  1. Chest pain with abnormal Cardiolite study.  2. Status post stenting of left anterior descending in 2002.  3. Status post cerebrovascular accident.  4. Chronic severe hypertension.  5. Chronic renal insufficiency.   PLAN:  Proceed with cardiac catheterization and/or angioplasty.  Will  pretreat his renal insufficiency with Mucomyst.                                                Peter M. Swaziland, M.D.    PMJ/MEDQ  D:  03/23/2003  T:  03/23/2003  Job:  [id number]

## 2010-07-05 ENCOUNTER — Other Ambulatory Visit: Payer: Self-pay | Admitting: Cardiology

## 2010-07-05 NOTE — Telephone Encounter (Signed)
Pt called said he wants to order exforge from [facility] please call

## 2010-07-06 MED ORDER — AMLODIPINE BESYLATE-VALSARTAN 10-320 MG PO TABS: 1.0000 | ORAL_TABLET | Freq: Every day | ORAL | Status: DC

## 2010-07-06 NOTE — Telephone Encounter (Signed)
Has been getting Exforge 10/320 from patient assist at [facility]. They no longer will refill unless he gets medicare part D.  [name] says he does not want to get Part D. Wants a Rx mailed to him and will get it from [place]. Sent

## 2010-08-06 ENCOUNTER — Telehealth: Payer: Self-pay | Admitting: Cardiology

## 2010-08-06 NOTE — Telephone Encounter (Signed)
Called stating he is still have some chest pain especially when he walks. Usually takes one NTG and pain is relieved. Has had this since stent placed.  No SOB. Usually has every day. Will contact Dr. Swaziland and will call him back

## 2010-08-06 NOTE — Telephone Encounter (Signed)
REGARDING THE STENTS ABT  A COUPLE OF MONTHS AGO, PT SAID NOT FEELING ANY BETTER AND ALMOST AS BAD AS HE WAS INITIALLY.

## 2010-08-07 NOTE — Telephone Encounter (Signed)
If he is having more angina we need to see again. He is status post 2 vessel PCI in march with significant residual disease in PDA and diagonal branches. Can discuss intensifying medical therapy versus repeat cath on visit.

## 2010-08-08 NOTE — Telephone Encounter (Signed)
Per Dr. Swaziland will see back in office. Called and LM w/pt to come in next Thurs 7/5. If symptoms become worse to call our office.

## 2010-08-16 ENCOUNTER — Encounter: Payer: Self-pay | Admitting: Cardiology

## 2010-08-16 ENCOUNTER — Ambulatory Visit (INDEPENDENT_AMBULATORY_CARE_PROVIDER_SITE_OTHER): Payer: Medicare Other | Admitting: Cardiology

## 2010-08-16 VITALS — BP 160/104 | HR 72 | Ht 70.0 in | Wt 176.8 lb

## 2010-08-16 DIAGNOSIS — I6350 Cerebral infarction due to unspecified occlusion or stenosis of unspecified cerebral artery: Secondary | ICD-10-CM

## 2010-08-16 DIAGNOSIS — Z955 Presence of coronary angioplasty implant and graft: Secondary | ICD-10-CM

## 2010-08-16 DIAGNOSIS — E789 Disorder of lipoprotein metabolism, unspecified: Secondary | ICD-10-CM

## 2010-08-16 DIAGNOSIS — Z9861 Coronary angioplasty status: Secondary | ICD-10-CM

## 2010-08-16 DIAGNOSIS — I2510 Atherosclerotic heart disease of native coronary artery without angina pectoris: Secondary | ICD-10-CM

## 2010-08-16 NOTE — Assessment & Plan Note (Signed)
His anginal symptoms have improved. I am not surprised that he still has residual angina since he has residual coronary disease that was not amenable to percutaneous intervention. He is intolerant to nitrates because of headaches. He is already on beta blockers and calcium channel blockers. He is not able to afford Ranexa. We will continue on his current medical therapy. He is to let us know if he has worsening anginal symptoms.

## 2010-08-16 NOTE — Patient Instructions (Addendum)
Continue your current medications.  Stay as active as possible.  If you notice worsening of your chest pain let me know.  I will see you back in 4 months and we will check lab work then.

## 2010-08-16 NOTE — Progress Notes (Signed)
Erik Adams Date of Birth: Aug 06, 1945   History of Present Illness: [name] is seen today for a followup visit. He continues to have some anginal symptoms with exertion. These occur occasionally. He has noted that the symptoms are less after he drinks a couple of beers. He states that with his first stent procedure he had much more complete relief then he has this time but his anginal symptoms are still better than they were prior to his stent procedure. He feels it is slowly improving.  Current Outpatient Prescriptions on File Prior to Visit  Medication Sig Dispense Refill  . amLODipine-valsartan (EXFORGE) 10-320 MG per tablet Take 1 tablet by mouth daily.  90 tablet  3  . aspirin 325 MG EC tablet Take 325 mg by mouth daily.        . clopidogrel (PLAVIX) 75 MG tablet Take 75 mg by mouth daily.        Marland Kitchen labetalol (NORMODYNE) 200 MG tablet Take 200 mg by mouth. Taking 2 tablets BID       . nitroGLYCERIN (NITROSTAT) 0.4 MG SL tablet Place 0.4 mg under the tongue every 5 (five) minutes as needed.        . rosuvastatin (CRESTOR) 10 MG tablet Take 10 mg by mouth daily.          Allergies  Allergen Reactions  . Ace Inhibitors   . Clonidine Derivatives     Past Medical History  Diagnosis Date  . CAD (coronary artery disease)   . HTN (hypertension)   . Hypercholesteremia   . CVA (cerebral infarction)   . CRI (chronic renal insufficiency)     Baseline creatinine of 1.4 to 1.7  . S/P coronary artery stent placement 05/01/10    LAD & Ramus Intermedius    Past Surgical History  Procedure Date  . Coronary angioplasty with stent placement 2000    BMS to the LAD  . Mastoidectomy   . Coronary angioplasty with stent placement March 2012    DES to LAD and Ramus intermdius    History  Smoking status  . Former Smoker  . Quit date: 02/11/1974  Smokeless tobacco  . Never Used    History  Alcohol Use No    Family History  Problem Relation Age of Onset  . Heart disease Father       Review of Systems: The review of systems is positive for mild bruising.  All other systems were reviewed and are negative.  Physical Exam: BP 160/104  Pulse 72  Ht 5\' 10"  (1.778 m)  Wt 176 lb 12.8 oz (80.196 kg)  BMI 25.37 kg/m2 Patient is very pleasant and in no acute distress. Skin is warm and dry. Color is normal.  HEENT is unremarkable. Normocephalic/atraumatic. PERRL. Sclera are nonicteric. Neck is supple. No masses. No JVD. Lungs are clear. Cardiac exam shows a regular rate and rhythm. Abdomen is soft. Extremities are without edema. His right arm looks great. Gait and ROM are intact. No gross neurologic deficits noted.   LABORATORY DATA: BMET and CBC are pending. ECG demonstrates sinus rhythm with occasional PVCs. He has an incomplete left bundle branch block. There is LVH with repolarization abnormality.  Assessment / Plan:

## 2010-08-17 ENCOUNTER — Encounter: Payer: Self-pay | Admitting: Cardiology

## 2010-09-21 ENCOUNTER — Other Ambulatory Visit: Payer: Self-pay | Admitting: *Deleted

## 2010-09-21 MED ORDER — LABETALOL HCL 200 MG PO TABS: ORAL_TABLET | ORAL | Status: DC

## 2010-09-21 NOTE — Telephone Encounter (Signed)
escribe medication per fax request  

## 2010-10-19 ENCOUNTER — Telehealth: Payer: Self-pay | Admitting: Internal Medicine

## 2010-11-01 LAB — BASIC METABOLIC PANEL WITH GFR
BUN: 25 — ABNORMAL HIGH
CO2: 26
Calcium: 9.3
Chloride: 97
Creatinine, Ser: 1.35
GFR calc non Af Amer: 54 — ABNORMAL LOW
Glucose, Bld: 97
Potassium: 4.6
Sodium: 132 — ABNORMAL LOW

## 2010-11-01 LAB — DIFFERENTIAL
Basophils Absolute: 0
Basophils Relative: 0
Eosinophils Relative: 4
Lymphocytes Relative: 17
Monocytes Absolute: 0.7
Monocytes Relative: 9
Neutro Abs: 5.2

## 2010-11-01 LAB — CBC
HCT: 40.5
Hemoglobin: 13.5
MCHC: 33.3
RBC: 4.45
RDW: 12.7

## 2010-11-07 ENCOUNTER — Emergency Department (HOSPITAL_COMMUNITY): Payer: Medicare Other

## 2010-11-07 ENCOUNTER — Inpatient Hospital Stay (HOSPITAL_COMMUNITY)
Admission: EM | Admit: 2010-11-07 | Discharge: 2010-11-09 | DRG: 293 | Disposition: A | Discharge status: Home / Self Care | Payer: Medicare Other | Attending: Emergency Medicine | Admitting: Emergency Medicine

## 2010-11-07 DIAGNOSIS — N183 Chronic kidney disease, stage 3 unspecified: Secondary | ICD-10-CM | POA: Diagnosis present

## 2010-11-07 DIAGNOSIS — Z7902 Long term (current) use of antithrombotics/antiplatelets: Secondary | ICD-10-CM

## 2010-11-07 DIAGNOSIS — I5043 Acute on chronic combined systolic (congestive) and diastolic (congestive) heart failure: Principal | ICD-10-CM | POA: Diagnosis present

## 2010-11-07 DIAGNOSIS — Z8673 Personal history of transient ischemic attack (TIA), and cerebral infarction without residual deficits: Secondary | ICD-10-CM

## 2010-11-07 DIAGNOSIS — Z7982 Long term (current) use of aspirin: Secondary | ICD-10-CM

## 2010-11-07 DIAGNOSIS — I10 Essential (primary) hypertension: Secondary | ICD-10-CM

## 2010-11-07 DIAGNOSIS — E785 Hyperlipidemia, unspecified: Secondary | ICD-10-CM | POA: Diagnosis present

## 2010-11-07 DIAGNOSIS — Z9861 Coronary angioplasty status: Secondary | ICD-10-CM

## 2010-11-07 DIAGNOSIS — I509 Heart failure, unspecified: Secondary | ICD-10-CM | POA: Diagnosis present

## 2010-11-07 DIAGNOSIS — I2510 Atherosclerotic heart disease of native coronary artery without angina pectoris: Secondary | ICD-10-CM | POA: Diagnosis present

## 2010-11-07 DIAGNOSIS — I129 Hypertensive chronic kidney disease with stage 1 through stage 4 chronic kidney disease, or unspecified chronic kidney disease: Secondary | ICD-10-CM | POA: Diagnosis present

## 2010-11-07 DIAGNOSIS — Z79899 Other long term (current) drug therapy: Secondary | ICD-10-CM

## 2010-11-07 LAB — DIFFERENTIAL
Lymphs Abs: 1.4 10*3/uL (ref 0.7–4.0)
Monocytes Absolute: 0.8 10*3/uL (ref 0.1–1.0)
Monocytes Relative: 8 % (ref 3–12)
Neutro Abs: 7.9 10*3/uL — ABNORMAL HIGH (ref 1.7–7.7)
Neutrophils Relative %: 77 % (ref 43–77)

## 2010-11-07 LAB — POCT I-STAT TROPONIN I: Troponin i, poc: 0.06 ng/mL (ref 0.00–0.08)

## 2010-11-07 LAB — CBC
Hemoglobin: 13.2 g/dL (ref 13.0–17.0)
MCH: 30.3 pg (ref 26.0–34.0)
MCHC: 33.5 g/dL (ref 30.0–36.0)
MCV: 90.6 fL (ref 78.0–100.0)
RBC: 4.35 MIL/uL (ref 4.22–5.81)

## 2010-11-07 LAB — COMPREHENSIVE METABOLIC PANEL
ALT: 46 U/L (ref 0–53)
AST: 35 U/L (ref 0–37)
Albumin: 3.5 g/dL (ref 3.5–5.2)
Alkaline Phosphatase: 105 U/L (ref 39–117)
BUN: 26 mg/dL — ABNORMAL HIGH (ref 6–23)
Chloride: 100 mEq/L (ref 96–112)
Potassium: 4.4 mEq/L (ref 3.5–5.1)
Sodium: 136 mEq/L (ref 135–145)
Total Bilirubin: 0.4 mg/dL (ref 0.3–1.2)
Total Protein: 6.7 g/dL (ref 6.0–8.3)

## 2010-11-07 LAB — PRO B NATRIURETIC PEPTIDE: Pro B Natriuretic peptide (BNP): 3898 pg/mL — ABNORMAL HIGH (ref 0–125)

## 2010-11-07 LAB — CK TOTAL AND CKMB (NOT AT ARMC)
CK, MB: 6 ng/mL — ABNORMAL HIGH (ref 0.3–4.0)
Relative Index: 3.6 — ABNORMAL HIGH (ref 0.0–2.5)
Total CK: 169 U/L (ref 7–232)

## 2010-11-08 DIAGNOSIS — I517 Cardiomegaly: Secondary | ICD-10-CM

## 2010-11-08 LAB — CARDIAC PANEL(CRET KIN+CKTOT+MB+TROPI)
Relative Index: 3.6 — ABNORMAL HIGH (ref 0.0–2.5)
Troponin I: <0.3 ng/mL (ref <0.30)

## 2010-11-08 LAB — BASIC METABOLIC PANEL
CO2: 28 mEq/L (ref 19–32)
Chloride: 103 mEq/L (ref 96–112)
Creatinine, Ser: 1.67 mg/dL — ABNORMAL HIGH (ref 0.50–1.35)
Glucose, Bld: 95 mg/dL (ref 70–99)

## 2010-11-08 LAB — MAGNESIUM: Magnesium: 1.9 mg/dL (ref 1.5–2.5)

## 2010-11-08 LAB — TSH: TSH: 3.789 u[IU]/mL (ref 0.350–4.500)

## 2010-11-09 ENCOUNTER — Telehealth: Payer: Self-pay | Admitting: *Deleted

## 2010-11-09 ENCOUNTER — Other Ambulatory Visit: Payer: Self-pay | Admitting: *Deleted

## 2010-11-09 DIAGNOSIS — I10 Essential (primary) hypertension: Secondary | ICD-10-CM

## 2010-11-09 DIAGNOSIS — I5043 Acute on chronic combined systolic (congestive) and diastolic (congestive) heart failure: Secondary | ICD-10-CM

## 2010-11-09 LAB — BASIC METABOLIC PANEL
BUN: 29 mg/dL — ABNORMAL HIGH (ref 6–23)
Creatinine, Ser: 1.89 mg/dL — ABNORMAL HIGH (ref 0.50–1.35)
GFR calc Af Amer: 43 mL/min — ABNORMAL LOW (ref >60)
GFR calc non Af Amer: 36 mL/min — ABNORMAL LOW (ref >60)
Potassium: 3.6 mEq/L (ref 3.5–5.1)

## 2010-11-09 NOTE — Telephone Encounter (Signed)
Lm for app for 10/5 w/labs (bmet) prior to app w/Lori

## 2010-11-14 NOTE — Discharge Summary (Addendum)
Erik Adams, Erik Adams NO.:  [id number]  MEDICAL RECORD NO.:  [id number]  LOCATION:  4713                         FACILITY:  [facility]  PHYSICIAN:  Mainor Hellmann M. Adams, M.D.  DATE OF BIRTH:  01/01/1946  DATE OF ADMISSION:  11/07/2010 DATE OF DISCHARGE:  11/09/2010                              DISCHARGE SUMMARY   DISCHARGE DIAGNOSES: 1. 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     a.     A 2-D echocardiogram November 07, 2009 demonstrating      ejection fraction of 45-50%. 2. Hypertensive urgency. 3. Coronary artery disease without evidence for myocardial infarction     this admission.     a.     Status post PCI and bare metal stent to the left anterior      descending in 2000.     b.     Catheterization in 2005 revealing patent left anterior      descending stent.     c.     Status post percutaneous coronary intervention and ION stent      to the left anterior descending and ION stent to the ramus      intermediate March 2012. 4. Hypertension. 5. Hyperlipidemia. 6. Prior cerebrovascular accident. 7. Stage III chronic kidney disease with baseline creatinine of 1.4-     1.7. 8. Status post mastoidectomy.  HOSPITAL COURSE:  Erik Adams is a 65-year gentleman with a history of CAD and normal LV function previously who presented to the ER with orthopnea and dyspnea in the setting of marked hypertension.  He was noted to have interstitial edema on chest x-ray and BNP was elevated at 3898.  He was felt to have acute diastolic heart failure and hypertensive urgency.  He was treated with titration of his blood pressure medications and also IV Lasix.  A 2-D echocardiogram was obtained which showed a slightly depressed EF of 45-50% and thus his heart failure was felt to be combined systolic and diastolic.  He was continued on his ARB, although the original Exforge HCT that he was taking was split up into Benicar, amlodipine, and HCTZ itself  was discontinued.  Hydralazine was ordered.  On day of discharge, the patient is feeling better.  He has been changed over to p.o. Lasix.  Dr. Swaziland has seen and examined him and feels he is stable for discharge.  DISCHARGE LABS:  WBC is 10.3, hemoglobin 13.2, hematocrit 39.4, platelet count 209,000.  Sodium 136, potassium 2.6, chloride 100, CO2 26, glucose 93, BUN 29, creatinine 1.89, magnesium is 1.9.  BNP 3898.  Cardiac enzymes were negative x2.  He had negative troponins, although his MBs were slightly elevated at 6.0.  TSH was normal.  STUDIES: 1. Chest x-ray November 07, 2010 which showed cardiomegaly with     vascular congestion.  Initial troponins and perihilar opacities,     right slightly greater than left.  Suspect mild edema. 2. A 2-D echocardiogram November 08, 2010 showed moderate LVH.  EF 45-     50%.  Grade I diastolic dysfunction.  Trivial aortic regurg.     Calcified mitral annulus.  Left atrium mildly dilated.  DISCHARGE MEDS: 1.  Amlodipine 10 mg daily. 2. Lasix 40 mg daily. 3. Hydralazine 25 mg t.i.d. 4. Benicar 40 mg daily. 5. Potassium chloride 20 mEq daily. 6. Aspirin 81 mg daily. 7. Labetalol 300 mg 2 tablets b.i.d. 8. Crestor 20 mg daily. 9. Nitroglycerin sublingual 0.4 mg every 5 minutes as needed up to 3     doses for chest pain. 10.Plavix 75 mg daily. 11.Please see above for discussion about splitting up Exforge HCT.  DISPOSITION:  Erik Adams will be discharged in stable condition to home. He is instructed to increase activity slowly and follow a low-sodium heart-healthy diet.  He should report his daily weights and to review the special instructions on the back of the pink sheet including notifying our office for increasing symptoms.  Our office will call him with an appointment with Dr. Swaziland.  Please note the patient refused home health nursing at this time.  DURATION OF DISCHARGE ENCOUNTER:  Greater than 30 minutes including physician and  PA time.     Ronie Spies, P.A.C.   ______________________________ Donn Zanetti M. Adams, M.D.    DD/MEDQ  D:  11/09/2010  T:  11/09/2010  Job:  [id number]  cc:   Erik Birnie M. Adams, M.D.  Electronically Signed by Ronie Spies  on 11/14/2010 09:29:40 PM Electronically Signed by Anaise Sterbenz Adams M.D. on 11/20/2010 01:01:36 PM

## 2010-11-15 ENCOUNTER — Encounter: Payer: Self-pay | Admitting: *Deleted

## 2010-11-16 ENCOUNTER — Ambulatory Visit (INDEPENDENT_AMBULATORY_CARE_PROVIDER_SITE_OTHER): Payer: Medicare Other | Admitting: Nurse Practitioner

## 2010-11-16 ENCOUNTER — Encounter: Payer: Self-pay | Admitting: Nurse Practitioner

## 2010-11-16 ENCOUNTER — Other Ambulatory Visit: Payer: Medicare Other | Admitting: *Deleted

## 2010-11-16 ENCOUNTER — Ambulatory Visit: Payer: Medicare Other | Admitting: Nurse Practitioner

## 2010-11-16 DIAGNOSIS — I10 Essential (primary) hypertension: Secondary | ICD-10-CM

## 2010-11-16 DIAGNOSIS — I2510 Atherosclerotic heart disease of native coronary artery without angina pectoris: Secondary | ICD-10-CM

## 2010-11-16 DIAGNOSIS — E789 Disorder of lipoprotein metabolism, unspecified: Secondary | ICD-10-CM

## 2010-11-16 DIAGNOSIS — I5040 Unspecified combined systolic (congestive) and diastolic (congestive) heart failure: Secondary | ICD-10-CM

## 2010-11-16 DIAGNOSIS — I5042 Chronic combined systolic (congestive) and diastolic (congestive) heart failure: Secondary | ICD-10-CM | POA: Insufficient documentation

## 2010-11-16 LAB — BASIC METABOLIC PANEL
Calcium: 8.9 mg/dL (ref 8.4–10.5)
Chloride: 99 mEq/L (ref 96–112)
Creatinine, Ser: 1.7 mg/dL — ABNORMAL HIGH (ref 0.4–1.5)
Sodium: 136 mEq/L (ref 135–145)

## 2010-11-16 LAB — HEPATIC FUNCTION PANEL
Bilirubin, Direct: 0.1 mg/dL (ref 0.0–0.3)
Total Bilirubin: 0.9 mg/dL (ref 0.3–1.2)

## 2010-11-16 LAB — LIPID PANEL
HDL: 46.3 mg/dL (ref >39.00)
LDL Cholesterol: 75 mg/dL (ref 0–99)
Total CHOL/HDL Ratio: 3
Triglycerides: 78 mg/dL (ref 0.0–149.0)

## 2010-11-16 NOTE — Progress Notes (Signed)
    Erik Adams Date of Birth: 16-Oct-1945   History of Present Illness: Erik Adams is seen back today for a post hospital visit. He is seen for Dr. Swaziland. He was hospitalized about 10 days ago with combined systolic and diastolic heart failure. This was in the setting of hypertensive urgency. His medicines have been adjusted. He feels better. He had orthopnea and dyspnea. He is no longer short of breath. No swelling. No cough. He is trying to watch his salt. He says he cannot identify a trigger but he had been out of town prior to this episode. No chest pain. Blood pressure has come down. He is tolerating his medicines.   Current Outpatient Prescriptions on File Prior to Visit  Medication Sig Dispense Refill  . aspirin 325 MG EC tablet Take 325 mg by mouth daily.        . clopidogrel (PLAVIX) 75 MG tablet Take 75 mg by mouth daily.        . nitroGLYCERIN (NITROSTAT) 0.4 MG SL tablet Place 0.4 mg under the tongue every 5 (five) minutes as needed.        . rosuvastatin (CRESTOR) 10 MG tablet Take 10 mg by mouth daily.         Allergies  Allergen Reactions  . Ace Inhibitors   . Clonidine Derivatives     Past Medical History  Diagnosis Date  . CAD (coronary artery disease)   . HTN (hypertension)   . Hypercholesteremia   . CVA (cerebral infarction)   . CRI (chronic renal insufficiency)     Baseline creatinine of 1.4 to 1.7  . S/P coronary artery stent placement 05/01/10    LAD & Ramus Intermedius    Past Surgical History  Procedure Date  . Coronary angioplasty with stent placement 2000    BMS to the LAD  . Mastoidectomy   . Coronary angioplasty with stent placement March 2012    DES to LAD and Ramus intermdius    History  Smoking status  . Former Smoker  . Quit date: 02/11/1974  Smokeless tobacco  . Never Used    History  Alcohol Use No    Family History  Problem Relation Age of Onset  . Heart disease Father     Review of Systems: The review of systems is  per the HPI.  All other systems were reviewed and are negative.  Physical Exam: BP 136/96  Pulse 74  Ht 5\' 10"  (1.778 m)  Wt 178 lb (80.74 kg)  BMI 25.54 kg/m2 Repeat blood pressure by me is down to 135/88.  Patient is very pleasant and in no acute distress. Very well dressed. Skin is warm and dry. Color is normal.  HEENT is unremarkable. Normocephalic/atraumatic. PERRL. Sclera are nonicteric. Neck is supple. No masses. No JVD. Lungs are clear. Cardiac exam shows a regular rate and rhythm. No S3.  Abdomen is soft. Extremities are without edema. Gait and ROM are intact. No gross neurologic deficits noted.   LABORATORY DATA: PENDING   Assessment / Plan:

## 2010-11-16 NOTE — Assessment & Plan Note (Signed)
No recurrent chest pain at this time.

## 2010-11-16 NOTE — Assessment & Plan Note (Signed)
Blood pressure has come down. We will continue with his current regimen.

## 2010-11-16 NOTE — Patient Instructions (Addendum)
Stay on your current medicines. We will see what your labs look like today.   Weigh yourself each morning and record. Take extra dose of diuretic for weight gain of 3 pounds in 24 hours.  Limit sodium intake. Goal is to have less than 2000 mg (2gm) of salt per day.   Call for any worsening of symptoms or problems.

## 2010-11-16 NOTE — Telephone Encounter (Signed)
BMET WAS COLLECTED ON 11/09/10 WITH RESULTS SEEN

## 2010-11-16 NOTE — Assessment & Plan Note (Signed)
EF is 45% to 50%. This recent spell was felt to be related to his high blood pressure. His medicines have been adjusted. He is doing better. We will check his labs today. I would like to see him again in about a month. We discussed the need for salt restriction and weighing daily. He will continue to monitor his blood pressure at home. Patient is agreeable to this plan and will call if any problems develop in the interim.

## 2010-11-19 ENCOUNTER — Telehealth: Payer: Self-pay | Admitting: *Deleted

## 2010-11-19 NOTE — Telephone Encounter (Signed)
Notified of lab results. Will send to Dr. Scotty Court

## 2010-11-19 NOTE — Telephone Encounter (Signed)
Message copied by Lorayne Bender on Mon Nov 19, 2010  2:00 PM ------      Message from: [place], PETER M      Created: Sat Nov 17, 2010  3:34 PM       Renal function at baseline. Normal LFTs. Lipids look great.       Theron Arista [place]

## 2010-11-25 NOTE — H&P (Signed)
Erik Adams, Erik Adams               ACCOUNT NO.:  [id number]  MEDICAL RECORD NO.:  [id number]  LOCATION:  4713                         FACILITY:  [facility]  PHYSICIAN:  Bevelyn Buckles. Bensimhon, MDDATE OF BIRTH:  [birth year]  DATE OF ADMISSION:  11/07/2010 DATE OF DISCHARGE:                             HISTORY & PHYSICAL   PRIMARY CARDIOLOGIST:  Dr. Peter [place].  PRIMARY CARE PROVIDER:  [name], MD  Erik Adams PROFILE:  This 65 year old male with a history of CAD and normal LV function presents to the ED with orthopnea, dyspnea, and interstitial edema in the setting of marked hypertension.  PROBLEMS: 1. Hypertensive urgency. 2. Acute diastolic congestive heart failure. 3. Coronary artery disease.     a.     Status post percutaneous coronary intervention and bare-      metal stenting to the left anterior descending in 2000.     b.     Catheterization in 2005 revealing patent left anterior      descending stent.     c.     Catheterization on April 04, 2010, left main minor      irregularities.  Left anterior descending 90% in-stent restenosis.      First diagonal 80% diffuse stenosis.  Ramus intermedius 80% mid      stenosis.  Obtuse marginal 2 - 80-90% stenosis.  Right coronary      artery minor irregularities distally.  Posterior descending artery      minor irregularities.  Posterolateral 80-90% stenosis.  Ejection      fraction was 65%.     d.     May 02, 2010, successful percutaneous coronary      intervention and stenting of the left anterior descending with 3.5      x 32-mm ION stent.  The ramus intermedius was stented with a 2.5 x      20-mm ION as well as a 2.5 x 24-mm ION stent. 4. Hypertension. 5. Hyperlipidemia. 6. Cerebrovascular accident. 7. Stage III chronic kidney disease with baseline creatinine of 1.4-     1.7. 8. Status post mastoidectomy.  ALLERGIES:  ACE INHIBITORS, NITRATES (caused headaches).  HISTORY OF PRESENT ILLNESS:  This is a 65 year old  male with a history of CAD as outlined above.  He has previously documented normal LV function by ventriculography in February 2012, with an EF of 65%.  Since his stenting in March 2012, the Erik Adams has been fairly active, walking 7 days a week, and having intermittent exertional chest discomfort, maybe 1 or 2 times per week, usually resolving with rest and rarely requiring sublingual nitroglycerin.  This morning, the Erik Adams awoke from sleep at approximately 7:30 and felt orthopneic and dyspneic. Symptoms progressed over the subsequent 2 hours prompting him to present to a local urgent care where he was found to be hypertensive.  He was transferred to [facility] for further evaluation where his chest x-ray shows interstitial edema while his proBNP is elevated at 3898.  EKG shows sinus rhythm with LVH and anterolateral ST depression in the setting of LVH and which is unchanged from old EKGs.  He was treated with IV Lasix with symptomatic improvement though his blood pressure  remained markedly elevated.  The Erik Adams denies any chest pain today.  HOME MEDICATIONS: 1. Exforge 10/320 mg daily. 2. Aspirin 325 mg daily. 3. Plavix 75 mg daily. 4. Labetalol 400 mg b.i.d. 5. Nitroglycerin p.r.n. 6. Crestor 10 mg p.o. daily.  FAMILY HISTORY:  Mother died at 47 of old age.  Father died of coronary artery disease at 58.  He has six brothers and sisters who are alive and well.  SOCIAL HISTORY:  The Erik Adams lives in [place] with his daughter, son, and grandson.  He is retired.  He previously smoked but quit in 1976. He drinks about two beers one night per week.  Denies drug use.  He walks 7 days a week with occasional chest pain as outlined above.  REVIEW OF SYSTEMS:  Positive chest pain with exertion 1-2 times per week.  Also notable for dyspnea and orthopnea this morning.  He is full code.  Otherwise, all systems reviewed and negative.  PHYSICAL EXAMINATION:  VITAL SIGNS:  Temperature  97.6, heart rate 77, respirations 13, blood pressure 209/126, pulse ox 98% on room air. GENERAL:  A pleasant white male, in no acute distress, awake, and oriented x3.  He has a normal affect. HEENT:  Normal. NEUROLOGIC:  Grossly intact, nonfocal. SKIN:  Warm and dry without lesions or masses. NECK:  Supple without bruits.  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 LUNGS:  Respirations are unlabored with crackles at bilateral bases, otherwise, clear to auscultation. CARDIAC:  Regular S1 and S2.  No S3, S4, or murmurs. ABDOMEN:  Round, soft, nontender, nondistended.  Bowel sounds present x4. EXTREMITIES:  Warm, dry, pink.  No clubbing, cyanosis, or edema. Dorsalis pedis and posterior tibial pulses 2+ and equal bilaterally.  His chest x-ray shows cardiomegaly with vascular congestion and interstitial prominence and perihilar opacities, right greater than left, suspicious for mild edema. EKG shows sinus rhythm, rate 80, normal axis, LVH with anterolateral ST depression which is old.  Hemoglobin 13.2, hematocrit 39.4, WBC 10.3, platelets 209.  Sodium 136, potassium 4.4, chloride 100, CO2 of 27, BUN 26, creatinine 1.55, glucose 115, total bilirubin 0.4, alkaline phosphatase 105, AST 35, ALT 46, total protein 6.7, albumin 3.5.  BNP 3898, CK 169, MB 6.0, troponin I 0.06.  Calcium 8.9, INR 1.02.  ASSESSMENT AND PLAN: 1. Acute diastolic congestive heart failure/hypertensive urgency.  The     Erik Adams presents with mild congestive heart failure in the setting     of hypertensive crisis.  I will plan to admit and continue diuresis     and adjust his home medications by increasing his labetalol and     also adding hydralazine.  Follow pressure with diuresis and titrate     medications as needed.  We will also consider the addition of     spironolactone.  We will check 2-D echocardiogram to reevaluate LV     function which was previously normal. 2. Coronary artery disease.  The Erik Adams denies chest discomfort      today.  We will cycle his cardiac markers.  Continue aspirin, beta-     blocker, Plavix, and statin therapy. 3. Hyperlipidemia.  Continue statin. 4. Stage III chronic kidney disease.  His creatinine is 1.55 which is     within his normal range. 5. Hypertension.  See #1.  Thank you for allowing me to see this Erik Adams.     [name], ANP   ______________________________ Bevelyn Buckles. Bensimhon, MD    CB/MEDQ  D:  11/07/2010  T:  11/07/2010  Job:  [id number]  Electronically Signed by [name] ANP on 11/13/2010 06:10:37 PM Electronically Signed by Arvilla Meres MD on 11/25/2010 02:47:34 PM

## 2010-11-26 LAB — CARDIAC PANEL(CRET KIN+CKTOT+MB+TROPI)
Relative Index: 2.5
Relative Index: 3.9 — ABNORMAL HIGH
Total CK: 214
Troponin I: 0.06
Troponin I: 0.07 — ABNORMAL HIGH

## 2010-11-26 LAB — BASIC METABOLIC PANEL
BUN: 30 — ABNORMAL HIGH
BUN: 37 — ABNORMAL HIGH
CO2: 24
Calcium: 8.8
Calcium: 9.1
Chloride: 104
Creatinine, Ser: 1.72 — ABNORMAL HIGH
Creatinine, Ser: 1.75 — ABNORMAL HIGH
GFR calc non Af Amer: 41 — ABNORMAL LOW

## 2010-11-26 LAB — CK TOTAL AND CKMB (NOT AT ARMC)
CK, MB: 9.9 — ABNORMAL HIGH
Total CK: 244 — ABNORMAL HIGH

## 2010-11-26 LAB — CBC
HCT: 40
Hemoglobin: 13.8
MCHC: 34.6
MCV: 90.3
RBC: 4.43
WBC: 9.6

## 2010-11-26 LAB — DIFFERENTIAL
Basophils Relative: 1
Eosinophils Absolute: 0.2
Eosinophils Relative: 3
Lymphs Abs: 2.6
Monocytes Absolute: 0.8 — ABNORMAL HIGH
Monocytes Relative: 8

## 2010-11-26 LAB — POCT CARDIAC MARKERS
CKMB, poc: 8
Myoglobin, poc: 364
Myoglobin, poc: 471
Operator id: 1627
Troponin i, poc: <0.05

## 2010-12-17 ENCOUNTER — Ambulatory Visit (INDEPENDENT_AMBULATORY_CARE_PROVIDER_SITE_OTHER): Payer: Medicare Other | Admitting: Nurse Practitioner

## 2010-12-17 ENCOUNTER — Encounter: Payer: Self-pay | Admitting: Nurse Practitioner

## 2010-12-17 VITALS — BP 152/102 | HR 82 | Ht 70.0 in | Wt 180.8 lb

## 2010-12-17 DIAGNOSIS — Z955 Presence of coronary angioplasty implant and graft: Secondary | ICD-10-CM

## 2010-12-17 DIAGNOSIS — I2510 Atherosclerotic heart disease of native coronary artery without angina pectoris: Secondary | ICD-10-CM

## 2010-12-17 DIAGNOSIS — R0600 Dyspnea, unspecified: Secondary | ICD-10-CM

## 2010-12-17 DIAGNOSIS — R0989 Other specified symptoms and signs involving the circulatory and respiratory systems: Secondary | ICD-10-CM

## 2010-12-17 DIAGNOSIS — N189 Chronic kidney disease, unspecified: Secondary | ICD-10-CM

## 2010-12-17 DIAGNOSIS — I10 Essential (primary) hypertension: Secondary | ICD-10-CM

## 2010-12-17 DIAGNOSIS — I5040 Unspecified combined systolic (congestive) and diastolic (congestive) heart failure: Secondary | ICD-10-CM

## 2010-12-17 DIAGNOSIS — R0609 Other forms of dyspnea: Secondary | ICD-10-CM

## 2010-12-17 MED ORDER — HYDRALAZINE HCL 50 MG PO TABS: 50.0000 mg | ORAL_TABLET | Freq: Three times a day (TID) | ORAL | Status: DC

## 2010-12-17 NOTE — Assessment & Plan Note (Signed)
He looks compensated. Will check his labs today.

## 2010-12-17 NOTE — Patient Instructions (Signed)
Increase the Hydralazine to 50 mg three times a day. You can use two of your 25 mg tablets three times a day and use them up. I sent a prescription for the 50 mg tablets to [facility].  We will see you in a month  We will check some labs today.  Call for any problems.

## 2010-12-17 NOTE — Assessment & Plan Note (Signed)
Rechecking labs today.

## 2010-12-17 NOTE — Assessment & Plan Note (Signed)
Blood pressure is up. I have increased the Hydralazine to 50 mg TID. We will see him back in a month. He may need evaluation to rule out RAS.

## 2010-12-17 NOTE — Assessment & Plan Note (Signed)
No chest pain. Will continue with medical management. He remains on his Plavix.

## 2010-12-17 NOTE — Progress Notes (Signed)
    Erik Adams Date of Birth: 03-25-1945 Medical Record #[id number]  History of Present Illness: [name] is seen today for a one month visit. He is seen for Dr. [place]. He was hospitalized last month with hypertensive urgency. Meds were changed. He comes back for follow up today. He feels ok. Blood pressure is running high. He has no complaint. No shortness of breath. He says he may have woken up one night a couple of weeks ago with feeling a little smothery. No chest pain. No swelling. He is tolerating all of his medicines.   Current Outpatient Prescriptions on File Prior to Visit  Medication Sig Dispense Refill  . amLODipine (NORVASC) 10 MG tablet Take 10 mg by mouth daily.        Marland Kitchen aspirin 325 MG EC tablet Take 325 mg by mouth daily.        . clopidogrel (PLAVIX) 75 MG tablet Take 75 mg by mouth daily.        . furosemide (LASIX) 40 MG tablet Take 40 mg by mouth daily.        Marland Kitchen labetalol (NORMODYNE) 300 MG tablet Take 300 mg by mouth 2 (two) times daily. Taking 2 tablets BID       . nitroGLYCERIN (NITROSTAT) 0.4 MG SL tablet Place 0.4 mg under the tongue every 5 (five) minutes as needed.        Marland Kitchen olmesartan (BENICAR) 40 MG tablet Take 40 mg by mouth daily.        . rosuvastatin (CRESTOR) 10 MG tablet Take 10 mg by mouth daily.         Allergies  Allergen Reactions  . Ace Inhibitors   . Clonidine Derivatives     Past Medical History  Diagnosis Date  . CAD (coronary artery disease)   . HTN (hypertension)   . Hypercholesteremia   . CVA (cerebral infarction)   . CRI (chronic renal insufficiency)     Baseline creatinine of 1.4 to 1.7  . S/P coronary artery stent placement 05/01/10    LAD & Ramus Intermedius  . 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     Past Surgical History  Procedure Date  . Coronary angioplasty with stent placement 2000    BMS to the LAD  . Mastoidectomy   . Coronary angioplasty with stent placement March 2012    DES to LAD and Ramus intermdius    History  Smoking status  . Former Smoker  . Quit date: 02/11/1974  Smokeless tobacco  . Never Used    History  Alcohol Use No    Family History  Problem Relation Age of Onset  . Heart disease Father     Review of Systems: The review of systems is per the HPI.  All other systems were reviewed and are negative.  Physical Exam: BP 152/102  Pulse 82  Ht 5\' 10"  (1.778 m)  Wt 180 lb 12.8 oz (82.01 kg)  BMI 25.94 kg/m2 Patient is very pleasant and in no acute distress. Skin is warm and dry. Color is normal.  HEENT is unremarkable. Normocephalic/atraumatic. PERRL. Sclera are nonicteric. Neck is supple. No masses. No JVD. Lungs are clear. Cardiac exam shows a regular rate and rhythm. Has an occasional ectopic. Abdomen is soft. Extremities are without edema. Gait and ROM are intact. No gross neurologic deficits noted.   LABORATORY DATA: BMET and BNP pending  Assessment / Plan:

## 2010-12-18 ENCOUNTER — Telehealth: Payer: Self-pay | Admitting: *Deleted

## 2010-12-18 LAB — BASIC METABOLIC PANEL
BUN: 29 mg/dL — ABNORMAL HIGH (ref 6–23)
CO2: 27 mEq/L (ref 19–32)
Calcium: 9 mg/dL (ref 8.4–10.5)
Chloride: 105 mEq/L (ref 96–112)
Creatinine, Ser: 1.7 mg/dL — ABNORMAL HIGH (ref 0.4–1.5)
GFR: 43.06 mL/min — ABNORMAL LOW (ref >60.00)
Glucose, Bld: 95 mg/dL (ref 70–99)
Potassium: 4.5 mEq/L (ref 3.5–5.1)
Sodium: 139 mEq/L (ref 135–145)

## 2010-12-18 LAB — BRAIN NATRIURETIC PEPTIDE: Pro B Natriuretic peptide (BNP): 561 pg/mL — ABNORMAL HIGH (ref 0.0–100.0)

## 2010-12-18 NOTE — Telephone Encounter (Signed)
Lm w/lab results. Will send Dr. Scotty Court copy.

## 2010-12-31 ENCOUNTER — Encounter: Payer: Self-pay | Admitting: *Deleted

## 2010-12-31 ENCOUNTER — Telehealth: Payer: Self-pay | Admitting: Cardiology

## 2010-12-31 NOTE — Telephone Encounter (Signed)
New problem Pt is taking plavix and has to have tooth pulled please call

## 2010-12-31 NOTE — Telephone Encounter (Signed)
Will review with Dr [place]

## 2010-12-31 NOTE — Telephone Encounter (Signed)
Per Dr [place] OK to hold Plavix for a couple of days but to stay on ASA.  Pt is aware.  A letter will be sent to pts home address stating the same.

## 2011-01-16 ENCOUNTER — Ambulatory Visit: Payer: Medicare Other | Admitting: Nurse Practitioner

## 2011-01-31 ENCOUNTER — Other Ambulatory Visit: Payer: Self-pay | Admitting: Cardiology

## 2011-01-31 NOTE — Telephone Encounter (Signed)
New msg Pt wants refill for plavix for 90 day supply faxed to [phone] Please let him know when done

## 2011-02-01 MED ORDER — CLOPIDOGREL BISULFATE 75 MG PO TABS: 75.0000 mg | ORAL_TABLET | Freq: Every day | ORAL | Status: DC

## 2011-02-01 NOTE — Telephone Encounter (Signed)
Error

## 2011-02-01 NOTE — Telephone Encounter (Signed)
REFILLED PLAVIX AND FAXED PRESCRIPTION

## 2011-02-15 ENCOUNTER — Other Ambulatory Visit: Payer: Self-pay | Admitting: Cardiology

## 2011-02-15 MED ORDER — CLOPIDOGREL BISULFATE 75 MG PO TABS: 75.0000 mg | ORAL_TABLET | Freq: Every day | ORAL | Status: DC

## 2011-04-16 NOTE — Telephone Encounter (Signed)
error 

## 2011-05-07 ENCOUNTER — Telehealth: Payer: Self-pay | Admitting: Cardiology

## 2011-05-07 NOTE — Telephone Encounter (Signed)
New Msg: Pt calling wanting to speak with nurse regarding pt needing some dental work and needing approval from MD to proceed with dental work. Dentist needs something in writing stating that is it ok for pt to go forward with dental work.   FAX #:Dr. Ranee Gosselin: [phone] Please include medications pt is taking on the fax.   Please return pt call to discuss further.

## 2011-05-07 NOTE — Telephone Encounter (Signed)
Spoke with patient was told will fax letter to [employer] stating ok with Dr.Jordan to have dental surgery.Hold plavix 7 days before surgery.Dr.Jordan out of office today will fax tomorrow 05/08/11.

## 2011-05-09 NOTE — Telephone Encounter (Signed)
Letter faxed to [employer].

## 2011-05-17 ENCOUNTER — Ambulatory Visit (INDEPENDENT_AMBULATORY_CARE_PROVIDER_SITE_OTHER): Payer: Medicare Other | Admitting: Cardiology

## 2011-05-17 ENCOUNTER — Encounter: Payer: Self-pay | Admitting: Cardiology

## 2011-05-17 VITALS — BP 122/78 | HR 72 | Ht 68.0 in | Wt 180.2 lb

## 2011-05-17 DIAGNOSIS — I2510 Atherosclerotic heart disease of native coronary artery without angina pectoris: Secondary | ICD-10-CM

## 2011-05-17 DIAGNOSIS — I10 Essential (primary) hypertension: Secondary | ICD-10-CM

## 2011-05-17 DIAGNOSIS — I509 Heart failure, unspecified: Secondary | ICD-10-CM

## 2011-05-17 DIAGNOSIS — N189 Chronic kidney disease, unspecified: Secondary | ICD-10-CM

## 2011-05-17 DIAGNOSIS — R0602 Shortness of breath: Secondary | ICD-10-CM

## 2011-05-17 DIAGNOSIS — I5040 Unspecified combined systolic (congestive) and diastolic (congestive) heart failure: Secondary | ICD-10-CM

## 2011-05-17 NOTE — Assessment & Plan Note (Signed)
Blood pressure actually is doing very well for him. We'll continue on his current antihypertensive therapy.

## 2011-05-17 NOTE — Progress Notes (Signed)
Erik Adams Date of Birth: [birth year] Medical Record #[id number]  History of Present Illness: Erik Adams is seen today for a followup visit. He has done very well since his last visit. He is tolerating his medications well. He does note that when he turns his neck around or looks up at something he gets woozy. He has had no dizziness or syncope. He denies any chest pain or recurrent shortness of breath. He does note one time where he ate some ham and afterwards got more short of breath.  Current Outpatient Prescriptions on File Prior to Visit  Medication Sig Dispense Refill  . amLODipine (NORVASC) 10 MG tablet Take 10 mg by mouth daily.        Marland Kitchen aspirin 325 MG EC tablet Take 325 mg by mouth daily.        . clopidogrel (PLAVIX) 75 MG tablet Take 1 tablet (75 mg total) by mouth daily.  90 tablet  3  . furosemide (LASIX) 40 MG tablet Take 40 mg by mouth daily.        . hydrALAZINE (APRESOLINE) 50 MG tablet Take 1 tablet (50 mg total) by mouth 3 (three) times daily.  90 tablet  6  . labetalol (NORMODYNE) 300 MG tablet Take 300 mg by mouth 2 (two) times daily. Taking 2 tablets BID       . nitroGLYCERIN (NITROSTAT) 0.4 MG SL tablet Place 0.4 mg under the tongue every 5 (five) minutes as needed.        Marland Kitchen olmesartan (BENICAR) 40 MG tablet Take 40 mg by mouth daily.        . rosuvastatin (CRESTOR) 10 MG tablet Take 10 mg by mouth daily.         Allergies  Allergen Reactions  . Ace Inhibitors   . Clonidine Derivatives     Past Medical History  Diagnosis Date  . CAD (coronary artery disease)   . HTN (hypertension)   . Hypercholesteremia   . CVA (cerebral infarction)   . CRI (chronic renal insufficiency)     Baseline creatinine of 1.4 to 1.7  . S/P coronary artery stent placement 05/01/10    LAD & Ramus Intermedius  . 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     Past Surgical History  Procedure Date  . Coronary angioplasty with stent placement  2000    BMS to the LAD  . Mastoidectomy   . Coronary angioplasty with stent placement March 2012    DES to LAD and Ramus intermdius    History  Smoking status  . Former Smoker  . Quit date: 02/11/1974  Smokeless tobacco  . Never Used    History  Alcohol Use No    Family History  Problem Relation Age of Onset  . Heart disease Father     Review of Systems: The review of systems is per the HPI.  All other systems were reviewed and are negative.  Physical Exam: BP 122/78  Pulse 72  Ht 5\' 8"  (1.727 m)  Wt 180 lb 3.2 oz (81.738 kg)  BMI 27.40 kg/m2 Patient is very pleasant and in no acute distress. Skin is warm and dry. Color is normal.  HEENT is unremarkable. Normocephalic/atraumatic. PERRL. Sclera are nonicteric. Neck is supple. No masses. No JVD. Lungs are clear. Cardiac exam shows a regular rate and rhythm. Has an occasional ectopic. Abdomen is soft. Extremities are without edema. Gait and ROM are intact. No  gross neurologic deficits noted.   LABORATORY DATA:   Assessment / Plan:

## 2011-05-17 NOTE — Assessment & Plan Note (Addendum)
He is well compensated on his exam today. I stressed the importance of sodium restriction. He will continue with Lasix 40 mg daily. When we see him back in 6 months we will check fasting lab work including chemistries, lipid panel, and BNP

## 2011-05-17 NOTE — Patient Instructions (Signed)
Continue your current medication  Avoid salt  I will see you again in 6 months with lab work

## 2011-05-17 NOTE — Assessment & Plan Note (Signed)
He is having no significant anginal symptoms. He does have significant coronary disease as noted. He has had extensive stenting of the LAD and intermediate branch with drug-eluting stents. We will continue medical therapy with aspirin, Plavix, beta blockers, and statin therapy.

## 2011-05-31 ENCOUNTER — Other Ambulatory Visit: Payer: Self-pay | Admitting: *Deleted

## 2011-05-31 MED ORDER — FUROSEMIDE 40 MG PO TABS: 40.0000 mg | ORAL_TABLET | Freq: Every day | ORAL | Status: DC

## 2011-05-31 NOTE — Telephone Encounter (Signed)
Refilled furosemide

## 2011-07-15 ENCOUNTER — Other Ambulatory Visit (HOSPITAL_COMMUNITY): Payer: Self-pay | Admitting: Internal Medicine

## 2011-11-13 ENCOUNTER — Encounter: Payer: Self-pay | Admitting: Cardiology

## 2011-11-13 ENCOUNTER — Ambulatory Visit (INDEPENDENT_AMBULATORY_CARE_PROVIDER_SITE_OTHER): Payer: Medicare Other | Admitting: Cardiology

## 2011-11-13 VITALS — BP 136/86 | HR 76 | Ht 68.0 in | Wt 180.4 lb

## 2011-11-13 DIAGNOSIS — I5040 Unspecified combined systolic (congestive) and diastolic (congestive) heart failure: Secondary | ICD-10-CM

## 2011-11-13 DIAGNOSIS — I509 Heart failure, unspecified: Secondary | ICD-10-CM

## 2011-11-13 DIAGNOSIS — I4891 Unspecified atrial fibrillation: Secondary | ICD-10-CM | POA: Insufficient documentation

## 2011-11-13 DIAGNOSIS — E7800 Pure hypercholesterolemia, unspecified: Secondary | ICD-10-CM

## 2011-11-13 DIAGNOSIS — I10 Essential (primary) hypertension: Secondary | ICD-10-CM

## 2011-11-13 DIAGNOSIS — N189 Chronic kidney disease, unspecified: Secondary | ICD-10-CM

## 2011-11-13 DIAGNOSIS — R0602 Shortness of breath: Secondary | ICD-10-CM

## 2011-11-13 DIAGNOSIS — I2510 Atherosclerotic heart disease of native coronary artery without angina pectoris: Secondary | ICD-10-CM

## 2011-11-13 HISTORY — DX: Unspecified atrial fibrillation: I48.91

## 2011-11-13 MED ORDER — WARFARIN SODIUM 5 MG PO TABS: 5.0000 mg | ORAL_TABLET | Freq: Every day | ORAL | Status: DC

## 2011-11-13 MED ORDER — FUROSEMIDE 40 MG PO TABS: 40.0000 mg | ORAL_TABLET | Freq: Two times a day (BID) | ORAL | Status: DC

## 2011-11-13 MED ORDER — ASPIRIN EC 81 MG PO TBEC: 81.0000 mg | DELAYED_RELEASE_TABLET | Freq: Every day | ORAL | Status: DC

## 2011-11-13 NOTE — Progress Notes (Signed)
Erik Adams Date of Birth: 05-27-1945 Medical Record #[id number]  History of Present Illness: Erik Adams is seen today for a followup visit. He reports that he has been having increased symptoms of orthopnea and PND. He has increased his Lasix to twice a day for a few days. He complains of some increased swelling in his right ankle. The symptoms have been going on for the past 6 months. He denies any TIA or CVA symptoms. He's had no bleeding. He does note more dyspnea when he walks up stairs. He has had no chest pain. He has a history of severe hypertension. He has had a prior CVA. He has coronary disease with previous stenting of the LAD and ramus intermediate branch in March of 2012. He has a history of congestive heart failure.  Current Outpatient Prescriptions on File Prior to Visit  Medication Sig Dispense Refill  . hydrALAZINE (APRESOLINE) 50 MG tablet Take 1 tablet (50 mg total) by mouth 3 (three) times daily.  90 tablet  6  . labetalol (NORMODYNE) 200 MG tablet TAKE 3 TABLETS TWICE DAILY.  270 tablet  3  . nitroGLYCERIN (NITROSTAT) 0.4 MG SL tablet Place 0.4 mg under the tongue every 5 (five) minutes as needed.        . NORVASC 10 MG tablet TAKE 1 TABLET EACH DAY.  330 each  3  . olmesartan (BENICAR) 40 MG tablet Take 40 mg by mouth daily.        . rosuvastatin (CRESTOR) 10 MG tablet Take 10 mg by mouth daily.       Marland Kitchen DISCONTD: furosemide (LASIX) 40 MG tablet Take 1 tablet (40 mg total) by mouth daily.  90 tablet  3  . warfarin (COUMADIN) 5 MG tablet Take 1 tablet (5 mg total) by mouth daily.  90 tablet  3  . DISCONTD: labetalol (NORMODYNE) 300 MG tablet Take 300 mg by mouth 2 (two) times daily. Taking 2 tablets BID         Allergies  Allergen Reactions  . Ace Inhibitors   . Clonidine Derivatives     Past Medical History  Diagnosis Date  . CAD (coronary artery disease)   . HTN (hypertension)   . Hypercholesteremia   . CVA (cerebral infarction)   . CRI (chronic renal  insufficiency)     Baseline creatinine of 1.4 to 1.7  . S/P coronary artery stent placement 05/01/10    LAD & Ramus Intermedius  . 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   . Atrial fibrillation 11/13/2011    Past Surgical History  Procedure Date  . Coronary angioplasty with stent placement 2000    BMS to the LAD  . Mastoidectomy   . Coronary angioplasty with stent placement March 2012    DES to LAD and Ramus intermdius    History  Smoking status  . Former Smoker  . Quit date: 02/11/1974  Smokeless tobacco  . Never Used    History  Alcohol Use No    Family History  Problem Relation Age of Onset  . Heart disease Father     Review of Systems: The review of systems is per the HPI.  All other systems were reviewed and are negative.  Physical Exam: BP 136/86  Pulse 76  Ht 5\' 8"  (1.727 m)  Wt 180 lb 6.4 oz (81.829 kg)  BMI 27.43 kg/m2 Patient is very pleasant and in no acute distress. Skin is  warm and dry. Color is normal.  HEENT is unremarkable. Normocephalic/atraumatic. PERRL. Sclera are nonicteric. Neck is supple. No masses. No JVD. Lungs are clear. Cardiac exam shows a regular rate and rhythm. Has an occasional ectopic. Abdomen is soft. Extremities are without edema. Gait and ROM are intact. No gross neurologic deficits noted.   LABORATORY DATA:  ECG demonstrates atrial fibrillation with a controlled ventricular response of 81 beats per minute. There is LVH with QRS widening and repolarization abnormality. QT is prolonged at 494 ms.  Assessment / Plan: 1. Atrial fibrillation, new onset. Duration is unknown. I think this is the reason he is more symptomatic with dyspnea and orthopnea. His rate is well controlled on his current dose of labetalol. He has a high [place] score. I have recommended anticoagulation. Given his history of coronary stents and chronic kidney disease I recommended Coumadin over the new anticoagulants. We will start  him on 5 mg daily. We'll stop his Plavix. We will reduce his aspirin to 81 mg daily. He will return to our Coumadin clinic early next week. Once he has been therapeutic on Coumadin for at least 4 weeks we will consider elective DC cardioversion. Given his history of heart failure, coronary disease, chronic kidney disease, and prolonged QT he is probably only a candidate for amiodarone as far as antiarrhythmic drugs are concerned. He may attempt to cardiovert him first on no medication in reserve amiodarone for recurrence. We will update his echocardiogram.  2. Coronary disease status post stenting of the LAD and intermediate branches with a drug-eluting stents in March of 2012. He is asymptomatic.  3. Hypertension blood pressure is actually improved which I attribute to his atrial fibrillation.  3. 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 I do think he is more symptomatic as he is in atrial fibrillation. We will check complete lab work today including CBC, chemistries, BMP, and TSH.

## 2011-11-13 NOTE — Patient Instructions (Signed)
Stop Plavix  Reduce ASA to 81 mg daily  Start coumadin 5 mg daily. We will have you return early next week to our coumadin clinic  Increase Lasix to twice a day.  Continue your other medication  We will schedule you for an echocardiogram  We will check lab work today.

## 2011-11-14 LAB — LIPID PANEL
Cholesterol: 98 mg/dL (ref 0–200)
HDL: 34.5 mg/dL — ABNORMAL LOW (ref >39.00)
LDL Cholesterol: 55 mg/dL (ref 0–99)
Triglycerides: 43 mg/dL (ref 0.0–149.0)
VLDL: 8.6 mg/dL (ref 0.0–40.0)

## 2011-11-14 LAB — BASIC METABOLIC PANEL
BUN: 28 mg/dL — ABNORMAL HIGH (ref 6–23)
Calcium: 8.7 mg/dL (ref 8.4–10.5)
Chloride: 103 mEq/L (ref 96–112)
Creatinine, Ser: 1.7 mg/dL — ABNORMAL HIGH (ref 0.4–1.5)
GFR: 42.65 mL/min — ABNORMAL LOW (ref >60.00)

## 2011-11-14 LAB — CBC WITH DIFFERENTIAL/PLATELET
Basophils Absolute: 0 10*3/uL (ref 0.0–0.1)
Eosinophils Absolute: 0.2 10*3/uL (ref 0.0–0.7)
HCT: 34.7 % — ABNORMAL LOW (ref 39.0–52.0)
Lymphs Abs: 1.3 10*3/uL (ref 0.7–4.0)
MCV: 84.7 fl (ref 78.0–100.0)
Monocytes Absolute: 0.7 10*3/uL (ref 0.1–1.0)
Platelets: 176 10*3/uL (ref 150.0–400.0)
RDW: 15 % — ABNORMAL HIGH (ref 11.5–14.6)

## 2011-11-14 LAB — HEPATIC FUNCTION PANEL: Total Bilirubin: 1.3 mg/dL — ABNORMAL HIGH (ref 0.3–1.2)

## 2011-11-15 ENCOUNTER — Other Ambulatory Visit: Payer: Self-pay

## 2011-11-15 DIAGNOSIS — D649 Anemia, unspecified: Secondary | ICD-10-CM

## 2011-11-20 ENCOUNTER — Ambulatory Visit (INDEPENDENT_AMBULATORY_CARE_PROVIDER_SITE_OTHER): Payer: Medicare Other | Admitting: Pharmacist

## 2011-11-20 DIAGNOSIS — R0602 Shortness of breath: Secondary | ICD-10-CM

## 2011-11-20 DIAGNOSIS — I2510 Atherosclerotic heart disease of native coronary artery without angina pectoris: Secondary | ICD-10-CM

## 2011-11-20 DIAGNOSIS — I10 Essential (primary) hypertension: Secondary | ICD-10-CM

## 2011-11-20 DIAGNOSIS — E7800 Pure hypercholesterolemia, unspecified: Secondary | ICD-10-CM

## 2011-11-20 DIAGNOSIS — I5040 Unspecified combined systolic (congestive) and diastolic (congestive) heart failure: Secondary | ICD-10-CM

## 2011-11-20 DIAGNOSIS — I4891 Unspecified atrial fibrillation: Secondary | ICD-10-CM

## 2011-11-22 ENCOUNTER — Other Ambulatory Visit: Payer: Medicare Other

## 2011-11-22 LAB — HEMOCCULT SLIDES (X 3 CARDS)
OCCULT 1: NEGATIVE
OCCULT 2: NEGATIVE

## 2011-11-26 ENCOUNTER — Ambulatory Visit (HOSPITAL_COMMUNITY): Payer: Medicare Other | Attending: Cardiovascular Disease

## 2011-11-26 DIAGNOSIS — I080 Rheumatic disorders of both mitral and aortic valves: Secondary | ICD-10-CM | POA: Insufficient documentation

## 2011-11-26 DIAGNOSIS — I369 Nonrheumatic tricuspid valve disorder, unspecified: Secondary | ICD-10-CM | POA: Insufficient documentation

## 2011-11-26 DIAGNOSIS — I509 Heart failure, unspecified: Secondary | ICD-10-CM | POA: Insufficient documentation

## 2011-11-26 DIAGNOSIS — I2510 Atherosclerotic heart disease of native coronary artery without angina pectoris: Secondary | ICD-10-CM | POA: Insufficient documentation

## 2011-11-26 DIAGNOSIS — I379 Nonrheumatic pulmonary valve disorder, unspecified: Secondary | ICD-10-CM | POA: Insufficient documentation

## 2011-11-26 DIAGNOSIS — E7800 Pure hypercholesterolemia, unspecified: Secondary | ICD-10-CM

## 2011-11-26 DIAGNOSIS — I10 Essential (primary) hypertension: Secondary | ICD-10-CM

## 2011-11-26 DIAGNOSIS — R0602 Shortness of breath: Secondary | ICD-10-CM

## 2011-11-26 DIAGNOSIS — I129 Hypertensive chronic kidney disease with stage 1 through stage 4 chronic kidney disease, or unspecified chronic kidney disease: Secondary | ICD-10-CM | POA: Insufficient documentation

## 2011-11-26 DIAGNOSIS — I5040 Unspecified combined systolic (congestive) and diastolic (congestive) heart failure: Secondary | ICD-10-CM

## 2011-11-26 DIAGNOSIS — I4891 Unspecified atrial fibrillation: Secondary | ICD-10-CM

## 2011-11-26 DIAGNOSIS — N189 Chronic kidney disease, unspecified: Secondary | ICD-10-CM | POA: Insufficient documentation

## 2011-11-26 NOTE — Progress Notes (Signed)
Echocardiogram performed.  

## 2011-11-27 ENCOUNTER — Ambulatory Visit (INDEPENDENT_AMBULATORY_CARE_PROVIDER_SITE_OTHER): Payer: Medicare Other | Admitting: *Deleted

## 2011-11-27 DIAGNOSIS — I4891 Unspecified atrial fibrillation: Secondary | ICD-10-CM

## 2011-11-28 ENCOUNTER — Telehealth: Payer: Self-pay | Admitting: Cardiology

## 2011-11-28 NOTE — Telephone Encounter (Signed)
New Problem: ° ° ° °Patient returned your call.  Please call back. °

## 2011-11-29 NOTE — Telephone Encounter (Signed)
Patient called echo results given. 

## 2011-11-29 NOTE — Telephone Encounter (Signed)
F/u   Patient returning nurse call he can be reached at [phone]

## 2011-12-04 ENCOUNTER — Inpatient Hospital Stay (HOSPITAL_COMMUNITY)
Admission: AD | Admit: 2011-12-04 | Discharge: 2011-12-07 | DRG: 293 | Disposition: A | Discharge status: Home / Self Care | Payer: Medicare Other | Source: Ambulatory Visit | Attending: Cardiovascular Disease | Admitting: Cardiovascular Disease

## 2011-12-04 ENCOUNTER — Ambulatory Visit (INDEPENDENT_AMBULATORY_CARE_PROVIDER_SITE_OTHER): Payer: Medicare Other | Admitting: *Deleted

## 2011-12-04 ENCOUNTER — Ambulatory Visit (INDEPENDENT_AMBULATORY_CARE_PROVIDER_SITE_OTHER): Payer: Medicare Other | Admitting: Physician Assistant

## 2011-12-04 ENCOUNTER — Inpatient Hospital Stay (HOSPITAL_COMMUNITY): Payer: Medicare Other

## 2011-12-04 ENCOUNTER — Encounter: Payer: Self-pay | Admitting: Physician Assistant

## 2011-12-04 VITALS — BP 150/90 | HR 95 | Ht 70.0 in | Wt 193.6 lb

## 2011-12-04 DIAGNOSIS — E7800 Pure hypercholesterolemia, unspecified: Secondary | ICD-10-CM

## 2011-12-04 DIAGNOSIS — Z7982 Long term (current) use of aspirin: Secondary | ICD-10-CM

## 2011-12-04 DIAGNOSIS — I129 Hypertensive chronic kidney disease with stage 1 through stage 4 chronic kidney disease, or unspecified chronic kidney disease: Secondary | ICD-10-CM | POA: Diagnosis present

## 2011-12-04 DIAGNOSIS — I5043 Acute on chronic combined systolic (congestive) and diastolic (congestive) heart failure: Secondary | ICD-10-CM

## 2011-12-04 DIAGNOSIS — D509 Iron deficiency anemia, unspecified: Secondary | ICD-10-CM | POA: Diagnosis present

## 2011-12-04 DIAGNOSIS — I4891 Unspecified atrial fibrillation: Secondary | ICD-10-CM

## 2011-12-04 DIAGNOSIS — Z7901 Long term (current) use of anticoagulants: Secondary | ICD-10-CM

## 2011-12-04 DIAGNOSIS — I2510 Atherosclerotic heart disease of native coronary artery without angina pectoris: Secondary | ICD-10-CM

## 2011-12-04 DIAGNOSIS — N183 Chronic kidney disease, stage 3 unspecified: Secondary | ICD-10-CM | POA: Diagnosis present

## 2011-12-04 DIAGNOSIS — Z79899 Other long term (current) drug therapy: Secondary | ICD-10-CM

## 2011-12-04 DIAGNOSIS — I5042 Chronic combined systolic (congestive) and diastolic (congestive) heart failure: Secondary | ICD-10-CM | POA: Diagnosis present

## 2011-12-04 DIAGNOSIS — N189 Chronic kidney disease, unspecified: Secondary | ICD-10-CM

## 2011-12-04 DIAGNOSIS — I10 Essential (primary) hypertension: Secondary | ICD-10-CM | POA: Diagnosis present

## 2011-12-04 DIAGNOSIS — Z955 Presence of coronary angioplasty implant and graft: Secondary | ICD-10-CM

## 2011-12-04 DIAGNOSIS — I5189 Other ill-defined heart diseases: Secondary | ICD-10-CM | POA: Diagnosis present

## 2011-12-04 DIAGNOSIS — I509 Heart failure, unspecified: Secondary | ICD-10-CM | POA: Diagnosis present

## 2011-12-04 DIAGNOSIS — Z8673 Personal history of transient ischemic attack (TIA), and cerebral infarction without residual deficits: Secondary | ICD-10-CM

## 2011-12-04 DIAGNOSIS — I5040 Unspecified combined systolic (congestive) and diastolic (congestive) heart failure: Secondary | ICD-10-CM

## 2011-12-04 HISTORY — DX: Heart failure, unspecified: I50.9

## 2011-12-04 HISTORY — DX: Intracardiac thrombosis, not elsewhere classified: I51.3

## 2011-12-04 HISTORY — DX: Iron deficiency anemia, unspecified: D50.9

## 2011-12-04 LAB — CBC WITH DIFFERENTIAL/PLATELET
Eosinophils Absolute: 0.2 10*3/uL (ref 0.0–0.7)
Eosinophils Relative: 2 % (ref 0–5)
HCT: 33.9 % — ABNORMAL LOW (ref 39.0–52.0)
Hemoglobin: 10.8 g/dL — ABNORMAL LOW (ref 13.0–17.0)
Lymphs Abs: 1.5 10*3/uL (ref 0.7–4.0)
MCH: 26.1 pg (ref 26.0–34.0)
MCV: 81.9 fL (ref 78.0–100.0)
Monocytes Relative: 10 % (ref 3–12)
RBC: 4.14 MIL/uL — ABNORMAL LOW (ref 4.22–5.81)

## 2011-12-04 LAB — BASIC METABOLIC PANEL
BUN: 28 mg/dL — ABNORMAL HIGH (ref 6–23)
CO2: 26 mEq/L (ref 19–32)
Calcium: 8.9 mg/dL (ref 8.4–10.5)
GFR calc non Af Amer: 40 mL/min — ABNORMAL LOW (ref >90)
Glucose, Bld: 113 mg/dL — ABNORMAL HIGH (ref 70–99)

## 2011-12-04 LAB — POCT INR: INR: 3.5

## 2011-12-04 MED ORDER — POTASSIUM CHLORIDE CRYS ER 10 MEQ PO TBCR
10.0000 meq | EXTENDED_RELEASE_TABLET | Freq: Every day | ORAL | Status: DC
  Filled 2011-12-04: qty 1

## 2011-12-04 MED ORDER — NITROGLYCERIN 0.4 MG SL SUBL: 0.4000 mg | SUBLINGUAL_TABLET | SUBLINGUAL | Status: DC | PRN

## 2011-12-04 MED ORDER — SODIUM CHLORIDE 0.9 % IJ SOLN: 3.0000 mL | INTRAMUSCULAR | Status: DC | PRN

## 2011-12-04 MED ORDER — SODIUM CHLORIDE 0.9 % IV SOLN: 250.0000 mL | INTRAVENOUS | Status: DC | PRN

## 2011-12-04 MED ORDER — ATORVASTATIN CALCIUM 20 MG PO TABS
20.0000 mg | ORAL_TABLET | Freq: Every day | ORAL | Status: DC
  Administered 2011-12-05 – 2011-12-06 (×2): 20 mg via ORAL
  Filled 2011-12-04 (×3): qty 1

## 2011-12-04 MED ORDER — WARFARIN - PHARMACIST DOSING INPATIENT: Freq: Every day | Status: DC

## 2011-12-04 MED ORDER — LABETALOL HCL 300 MG PO TABS
600.0000 mg | ORAL_TABLET | Freq: Two times a day (BID) | ORAL | Status: DC
  Administered 2011-12-05 (×2): 600 mg via ORAL
  Filled 2011-12-04 (×3): qty 2

## 2011-12-04 MED ORDER — IRBESARTAN 300 MG PO TABS
300.0000 mg | ORAL_TABLET | Freq: Every day | ORAL | Status: DC
  Administered 2011-12-05 – 2011-12-07 (×2): 300 mg via ORAL
  Filled 2011-12-04 (×3): qty 1

## 2011-12-04 MED ORDER — ACETAMINOPHEN 325 MG PO TABS: 650.0000 mg | ORAL_TABLET | ORAL | Status: DC | PRN

## 2011-12-04 MED ORDER — FUROSEMIDE 10 MG/ML IJ SOLN
60.0000 mg | Freq: Two times a day (BID) | INTRAMUSCULAR | Status: DC
  Administered 2011-12-05 – 2011-12-07 (×5): 60 mg via INTRAVENOUS
  Filled 2011-12-04 (×6): qty 6

## 2011-12-04 MED ORDER — ASPIRIN EC 81 MG PO TBEC
81.0000 mg | DELAYED_RELEASE_TABLET | Freq: Every day | ORAL | Status: DC
  Administered 2011-12-05 – 2011-12-07 (×2): 81 mg via ORAL
  Filled 2011-12-04 (×3): qty 1

## 2011-12-04 MED ORDER — HYDRALAZINE HCL 50 MG PO TABS
50.0000 mg | ORAL_TABLET | Freq: Three times a day (TID) | ORAL | Status: DC
  Administered 2011-12-04 – 2011-12-05 (×4): 50 mg via ORAL
  Filled 2011-12-04 (×7): qty 1

## 2011-12-04 MED ORDER — AMLODIPINE BESYLATE 10 MG PO TABS
10.0000 mg | ORAL_TABLET | Freq: Every day | ORAL | Status: DC
  Administered 2011-12-05 – 2011-12-07 (×2): 10 mg via ORAL
  Filled 2011-12-04 (×3): qty 1

## 2011-12-04 MED ORDER — SODIUM CHLORIDE 0.9 % IJ SOLN
3.0000 mL | Freq: Two times a day (BID) | INTRAMUSCULAR | Status: DC
  Administered 2011-12-05 – 2011-12-07 (×4): 3 mL via INTRAVENOUS

## 2011-12-04 NOTE — Patient Instructions (Addendum)
PT ADMITTED TODAY TO [facility] UNIT DX ACUTE ON CHRONIC COMBINED HEART FAILURE

## 2011-12-04 NOTE — H&P (Signed)
Admission History and Physical   Date:  12/04/2011   Name:  Erik Adams   DOB:  November 18, 1945   MRN:  [id number]  PCP:  Franciso Bend, MD  Primary Cardiologist:  Dr. Peter Swaziland  Primary Electrophysiologist:  None    History of Present Illness: Erik Adams is a 66 y.o. male who is added on to my schedule for CHF.  He has a hx of CAD, s/p prior PCI, combined systolic and diastolic CHF, HTN, CKD and prior stroke. He recently saw Dr. Swaziland and was noted to be in new onset atrial fibrillation. He was also noted to be volume overloaded. Followup echocardiogram demonstrated worsening LV function with EF 20%. The patient was placed on Coumadin with plans for tentative cardioversion in 3 weeks. His Lasix was also adjusted. When seen in the Coumadin clinic today he was noted to be more short of breath and more swollen. His weight is up 13 pounds since last being seen.  He describes Class III-IIIb dyspnea, orthopnea and PND.  LE edema is worse and is now painful.  No chest pain.  No syncope.  He has a cough with clear sputum.     Labs (10/13):   K 4.1, creatinine 1.7, ALT 21, Hgb 11.3, TSH 6.34  INR's: Lab Results  Component Value Date   INR 3.5 12/04/2011   INR 2.7 11/27/2011   INR 1.8 11/20/2011    Weights: Wt Readings from Last 3 Encounters:  12/04/11 193 lb 9.6 oz (87.816 kg)  11/13/11 180 lb 6.4 oz (81.829 kg)  05/17/11 180 lb 3.2 oz (81.738 kg)     Past Medical History  Diagnosis Date  . CAD (coronary artery disease)     a. s/p BMS to mLAD 2000;  b. LHC 3/12:  sig ISR mLAD, RI 80-90%, Dx1 90% (too small for PCI);  PCI:  ION DES to mLAD, ION DES to RI  . HTN (hypertension)   . Hypercholesteremia   . CVA (cerebral infarction)   . CRI (chronic renal insufficiency)     Baseline creatinine of 1.4 to 1.7  . 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 EF is 45 to 50%;  b. echo 10/13:  inf HK, mod LVH, EF 20%, mild AI, mod MR, mod TR, mod BAE, PASP 46    . Atrial fibrillation 11/13/2011    coumadin started    Current Outpatient Prescriptions  Medication Sig Dispense Refill  . aspirin EC 81 MG tablet Take 1 tablet (81 mg total) by mouth daily.  90 tablet  3  . furosemide (LASIX) 40 MG tablet Take 1 tablet (40 mg total) by mouth 2 (two) times daily.  180 tablet  3  . hydrALAZINE (APRESOLINE) 50 MG tablet Take 1 tablet (50 mg total) by mouth 3 (three) times daily.  90 tablet  6  . labetalol (NORMODYNE) 200 MG tablet TAKE 3 TABLETS TWICE DAILY.  270 tablet  3  . nitroGLYCERIN (NITROSTAT) 0.4 MG SL tablet Place 0.4 mg under the tongue every 5 (five) minutes as needed.        . NORVASC 10 MG tablet TAKE 1 TABLET EACH DAY.  330 each  3  . olmesartan (BENICAR) 40 MG tablet Take 40 mg by mouth daily.        . rosuvastatin (CRESTOR) 10 MG tablet Take 10 mg by mouth daily.       Marland Kitchen warfarin (COUMADIN) 5 MG tablet Take 1 tablet (5  mg total) by mouth daily.  90 tablet  3    Allergies: Allergies  Allergen Reactions  . Ace Inhibitors   . Clonidine Derivatives     Social History:   reports that he quit smoking about 37 years ago. He has never used smokeless tobacco. He reports that he does not drink alcohol or use illicit drugs.   Family History:   family history includes Heart disease in his father.   ROS:  Please see the history of present illness.   No fevers, cough, melena, hematochezia.   All other systems reviewed and negative.   PHYSICAL EXAM: VS:  BP 150/90  Pulse 95  Ht 5\' 10"  (1.778 m)  Wt 193 lb 9.6 oz (87.816 kg)  BMI 27.78 kg/m2  SpO2 86% Well nourished, well developed, in no acute distress HEENT: normal Neck: + JVD to the angle of his jaw Cardiac:  normal S1, S2; RRR; no murmur; no S3 Lungs:  Bibasilar rales, no wheezing or rhonchi  Abd: distended Ext: 2+ bilateral LE edema Skin: warm and dry Neuro:  CNs 2-12 intact, no focal abnormalities noted  EKG:  AFib, HR 95, normal axis, PRWP, TW inversions V5-6, no change from  prior      ASSESSMENT AND PLAN:  1. A/C Combined Systolic and Diastolic CHF:   He is volume overloaded.  I have recommended admission to the hospital for IV diuresis.  He was also seen by Dr. Excell Seltzer (DOD) who agreed.  Given his volume overload, he will most likely require inpatient stay that spans > 2 midnights.  Will place him on Lasix 60 mg IV bid, K+ 10 mEq bid.    2. Atrial Fibrillation:   Rate with reasonable control.  Continue current dose of coumadin.  May need to consider TEE-DCCV while inpatient.   He is on coumadin 5 mg QD except 7.5 mg on Wed.  Continue current dose and have pharmacy manage coumadin.  3. Coronary Artery Disease:   No angina.  Continue ASA.    4. Hypertension:   BP elevated.  Continue current Rx and continue to monitor.  5. Hyperlipidemia:   Continue statin.  6. Chronic Kidney Disease:   Monitor renal fxn closely with diuresis.    Signed, Tereso Newcomer, PA-C  4:56 PM 12/04/2011     Patient seen, examined. Available data reviewed. Agree with findings, assessment, and plan as outlined by Tereso Newcomer, PA-C. Pt independently examined and interviewed. Exam notable for 3+ peripheral edema, irregular heart rhythm. He is too decompensated for outpatient management. Will admit for treatment of acute on chronic mixed CHF (IV lasix, etc).  Tonny Bollman, M.D. 12/11/2011 5:20 PM

## 2011-12-04 NOTE — Progress Notes (Signed)
[address], [address] [place], [place]  [address] Phone: [phone], Fax:  [phone]  Date:  12/04/2011   Name:  Erik Adams   DOB:  02/27/1945   MRN:  [id number]  PCP:  Franciso Bend, MD  Primary Cardiologist:  Dr. Peter Swaziland  Primary Electrophysiologist:  None    History of Present Illness: Erik Adams is a 66 y.o. male who is added on to my schedule for CHF.  He has a hx of CAD, s/p prior PCI, combined systolic and diastolic CHF, HTN, CKD and prior stroke. He recently saw Dr. Swaziland and was noted to be in new onset atrial fibrillation. He was also noted to be volume overloaded. Followup echocardiogram demonstrated worsening LV function with EF 20%. The patient was placed on Coumadin with plans for tentative cardioversion in 3 weeks. His Lasix was also adjusted. When seen in the Coumadin clinic today he was noted to be more short of breath and more swollen. His weight is up 13 pounds since last being seen.  He describes Class III-IIIb dyspnea, orthopnea and PND.  LE edema is worse and is now painful.  No chest pain.  No syncope.  He has a cough with clear sputum.     Labs (10/13):   K 4.1, creatinine 1.7, ALT 21, Hgb 11.3, TSH 6.34  INR's: Lab Results  Component Value Date   INR 3.5 12/04/2011   INR 2.7 11/27/2011   INR 1.8 11/20/2011    Weights: Wt Readings from Last 3 Encounters:  12/04/11 193 lb 9.6 oz (87.816 kg)  11/13/11 180 lb 6.4 oz (81.829 kg)  05/17/11 180 lb 3.2 oz (81.738 kg)     Past Medical History  Diagnosis Date  . CAD (coronary artery disease)     a. s/p BMS to mLAD 2000;  b. LHC 3/12:  sig ISR mLAD, RI 80-90%, Dx1 90% (too small for PCI);  PCI:  ION DES to mLAD, ION DES to RI  . HTN (hypertension)   . Hypercholesteremia   . CVA (cerebral infarction)   . CRI (chronic renal insufficiency)     Baseline creatinine of 1.4 to 1.7  . 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 EF is 45 to 50%;  b.  echo 10/13:  inf HK, mod LVH, EF 20%, mild AI, mod MR, mod TR, mod BAE, PASP 46  . Atrial fibrillation 11/13/2011    coumadin started    Current Outpatient Prescriptions  Medication Sig Dispense Refill  . aspirin EC 81 MG tablet Take 1 tablet (81 mg total) by mouth daily.  90 tablet  3  . furosemide (LASIX) 40 MG tablet Take 1 tablet (40 mg total) by mouth 2 (two) times daily.  180 tablet  3  . hydrALAZINE (APRESOLINE) 50 MG tablet Take 1 tablet (50 mg total) by mouth 3 (three) times daily.  90 tablet  6  . labetalol (NORMODYNE) 200 MG tablet TAKE 3 TABLETS TWICE DAILY.  270 tablet  3  . nitroGLYCERIN (NITROSTAT) 0.4 MG SL tablet Place 0.4 mg under the tongue every 5 (five) minutes as needed.        . NORVASC 10 MG tablet TAKE 1 TABLET EACH DAY.  330 each  3  . olmesartan (BENICAR) 40 MG tablet Take 40 mg by mouth daily.        . rosuvastatin (CRESTOR) 10 MG tablet Take 10 mg by mouth daily.       Marland Kitchen  warfarin (COUMADIN) 5 MG tablet Take 1 tablet (5 mg total) by mouth daily.  90 tablet  3    Allergies: Allergies  Allergen Reactions  . Ace Inhibitors   . Clonidine Derivatives     Social History:   reports that he quit smoking about 37 years ago. He has never used smokeless tobacco. He reports that he does not drink alcohol or use illicit drugs.   Family History:   family history includes Heart disease in his father.   ROS:  Please see the history of present illness.   No fevers, cough, melena, hematochezia.   All other systems reviewed and negative.   PHYSICAL EXAM: VS:  BP 150/90  Pulse 95  Ht 5\' 10"  (1.778 m)  Wt 193 lb 9.6 oz (87.816 kg)  BMI 27.78 kg/m2  SpO2 86% Well nourished, well developed, in no acute distress HEENT: normal Neck: + JVD to the angle of his jaw Cardiac:  normal S1, S2; RRR; no murmur; no S3 Lungs:  Bibasilar rales, no wheezing or rhonchi  Abd: distended Ext: 2+ bilateral LE edema Skin: warm and dry Neuro:  CNs 2-12 intact, no focal abnormalities  noted  EKG:  AFib, HR 95, normal axis, PRWP, TW inversions V5-6, no change from prior      ASSESSMENT AND PLAN:  1. A/C Combined Systolic and Diastolic CHF:   He is volume overloaded.  I have recommended admission to the hospital for IV diuresis.  He was also seen by Dr. Excell Seltzer (DOD) who agreed.  Given his volume overload, he will most likely require inpatient stay that spans > 2 midnights.  2. Atrial Fibrillation:   Rate with reasonable control.  Continue current dose of coumadin.  May need to consider TEE-DCCV while inpatient.    3. Coronary Artery Disease:   No angina.  Continue ASA.    4. Hypertension:   BP elevated.  Continue current Rx and continue to monitor.  5. Hyperlipidemia:   Continue statin.  6. Chronic Kidney Disease:   Monitor renal fxn closely with diuresis.    Signed, Tereso Newcomer, PA-C  4:56 PM 12/04/2011

## 2011-12-04 NOTE — Progress Notes (Signed)
ANTICOAGULATION CONSULT NOTE - Initial Consult  Pharmacy Consult for Coumadin Indication: atrial fibrillation  Allergies  Allergen Reactions  . Ace Inhibitors Other (See Comments)    Unknown reaction  . Clonidine Derivatives Other (See Comments)    Unknown reaction    Patient Measurements: Height: 5\' 10"  (177.8 cm) Weight: 192 lb 8 oz (87.317 kg) (a scale) IBW/kg (Calculated) : 73  Vital Signs: Temp: 98.7 F (37.1 C) (10/23 2023) Temp src: Oral (10/23 2023) BP: 143/91 mmHg (10/23 2023) Pulse Rate: 100  (10/23 2023)  Labs:  Basename 12/04/11 1603  HGB --  HCT --  PLT --  APTT --  LABPROT --  INR 3.5  HEPARINUNFRC --  CREATININE --  CKTOTAL --  CKMB --  TROPONINI --    Estimated Creatinine Clearance: 43.5 ml/min (by C-G formula based on Cr of 1.7).   Medical History: Past Medical History  Diagnosis Date  . CAD (coronary artery disease)     a. s/p BMS to mLAD 2000;  b. LHC 3/12:  sig ISR mLAD, RI 80-90%, Dx1 90% (too small for PCI);  PCI:  ION DES to mLAD, ION DES to RI  . HTN (hypertension)   . Hypercholesteremia   . CVA (cerebral infarction)   . CRI (chronic renal insufficiency)     Baseline creatinine of 1.4 to 1.7  . 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 EF is 45 to 50%;  b. echo 10/13:  inf HK, mod LVH, EF 20%, mild AI, mod MR, mod TR, mod BAE, PASP 46  . Atrial fibrillation 11/13/2011    coumadin started    Medications:  Prescriptions prior to admission  Medication Sig Dispense Refill  . amLODipine (NORVASC) 10 MG tablet Take 10 mg by mouth daily.      Marland Kitchen aspirin EC 81 MG tablet Take 1 tablet (81 mg total) by mouth daily.  90 tablet  3  . furosemide (LASIX) 40 MG tablet Take 1 tablet (40 mg total) by mouth 2 (two) times daily.  180 tablet  3  . hydrALAZINE (APRESOLINE) 50 MG tablet Take 1 tablet (50 mg total) by mouth 3 (three) times daily.  90 tablet  6  . labetalol (NORMODYNE) 200 MG tablet Take 600 mg by mouth 2 (two) times  daily.      . nitroGLYCERIN (NITROSTAT) 0.4 MG SL tablet Place 0.4 mg under the tongue every 5 (five) minutes as needed. For chest pain      . olmesartan (BENICAR) 40 MG tablet Take 40 mg by mouth daily.       . rosuvastatin (CRESTOR) 10 MG tablet Take 10 mg by mouth daily.       Marland Kitchen warfarin (COUMADIN) 5 MG tablet Take 5 mg by mouth See admin instructions. Take 1 1/2 tablet (7.5 mg) on Wednesday and take 1 tablet (5 mg) on all other days       Scheduled:    . amLODipine  10 mg Oral Daily  . aspirin EC  81 mg Oral Daily  . atorvastatin  20 mg Oral q1800  . furosemide  60 mg Intravenous Q12H  . hydrALAZINE  50 mg Oral TID  . irbesartan  300 mg Oral Daily  . labetalol  600 mg Oral BID  . potassium chloride  10 mEq Oral Daily  . sodium chloride  3 mL Intravenous Q12H    Assessment: 66 YOM admitted with SOB and 13 lb weight gain on Coumadin prior to  admission for new onset Afib with tentative plans for cardioversion in new future. MD requested pharmacy to continue dosing Coumadin. INR today is 3.5- supra-therapeutic. Home regimen was 5mg  daily except 7.5mg  on Wednesday. Patient reports taking last dose tonight (12/04/11).   Goal of Therapy:  INR 2-3   Plan:  1. No further Coumadin tonight. 2. Follow-up daily PT/INR.   Link Snuffer, PharmD, BCPS Clinical Pharmacist [phone] 12/04/2011,10:33 PM

## 2011-12-05 ENCOUNTER — Encounter (HOSPITAL_COMMUNITY): Payer: Self-pay | Admitting: *Deleted

## 2011-12-05 DIAGNOSIS — I10 Essential (primary) hypertension: Secondary | ICD-10-CM

## 2011-12-05 DIAGNOSIS — I5043 Acute on chronic combined systolic (congestive) and diastolic (congestive) heart failure: Principal | ICD-10-CM

## 2011-12-05 DIAGNOSIS — I4891 Unspecified atrial fibrillation: Secondary | ICD-10-CM

## 2011-12-05 DIAGNOSIS — I2510 Atherosclerotic heart disease of native coronary artery without angina pectoris: Secondary | ICD-10-CM

## 2011-12-05 LAB — BASIC METABOLIC PANEL
CO2: 25 mEq/L (ref 19–32)
CO2: 25 mEq/L (ref 19–32)
Calcium: 8.8 mg/dL (ref 8.4–10.5)
Calcium: 8.6 mg/dL (ref 8.4–10.5)
Chloride: 101 mEq/L (ref 96–112)
Chloride: 98 mEq/L (ref 96–112)
GFR calc Af Amer: 52 mL/min — ABNORMAL LOW (ref >90)
Glucose, Bld: 88 mg/dL (ref 70–99)
Potassium: 3.4 mEq/L — ABNORMAL LOW (ref 3.5–5.1)
Sodium: 139 mEq/L (ref 135–145)
Sodium: 134 mEq/L — ABNORMAL LOW (ref 135–145)

## 2011-12-05 LAB — PROTIME-INR
INR: 3.19 — ABNORMAL HIGH (ref 0.00–1.49)
Prothrombin Time: 30.9 seconds — ABNORMAL HIGH (ref 11.6–15.2)

## 2011-12-05 LAB — TROPONIN I: Troponin I: <0.3 ng/mL (ref <0.30)

## 2011-12-05 MED ORDER — POTASSIUM CHLORIDE CRYS ER 20 MEQ PO TBCR
40.0000 meq | EXTENDED_RELEASE_TABLET | Freq: Two times a day (BID) | ORAL | Status: DC
  Administered 2011-12-05 – 2011-12-07 (×3): 40 meq via ORAL
  Filled 2011-12-05 (×3): qty 2
  Filled 2011-12-05: qty 1
  Filled 2011-12-05: qty 2

## 2011-12-05 MED ORDER — AMIODARONE HCL 200 MG PO TABS
400.0000 mg | ORAL_TABLET | Freq: Two times a day (BID) | ORAL | Status: DC
  Administered 2011-12-05 (×2): 400 mg via ORAL
  Filled 2011-12-05 (×4): qty 2

## 2011-12-05 MED ORDER — CARVEDILOL 25 MG PO TABS
25.0000 mg | ORAL_TABLET | Freq: Two times a day (BID) | ORAL | Status: DC
  Administered 2011-12-05 – 2011-12-07 (×4): 25 mg via ORAL
  Filled 2011-12-05 (×6): qty 1

## 2011-12-05 MED ORDER — POTASSIUM CHLORIDE CRYS ER 20 MEQ PO TBCR
30.0000 meq | EXTENDED_RELEASE_TABLET | Freq: Once | ORAL | Status: DC
  Filled 2011-12-05: qty 1

## 2011-12-05 MED ORDER — WARFARIN SODIUM 2.5 MG PO TABS
2.5000 mg | ORAL_TABLET | Freq: Once | ORAL | Status: AC
  Administered 2011-12-05: 2.5 mg via ORAL
  Filled 2011-12-05: qty 1

## 2011-12-05 MED ORDER — FUROSEMIDE 10 MG/ML IJ SOLN
INTRAMUSCULAR | Status: AC
  Filled 2011-12-05: qty 8

## 2011-12-05 MED ORDER — POTASSIUM CHLORIDE CRYS ER 20 MEQ PO TBCR
20.0000 meq | EXTENDED_RELEASE_TABLET | Freq: Every day | ORAL | Status: DC
  Administered 2011-12-05: 20 meq via ORAL
  Filled 2011-12-05: qty 1

## 2011-12-05 MED ORDER — SODIUM CHLORIDE 0.9 % IV SOLN
INTRAVENOUS | Status: DC
  Administered 2011-12-05: 10 mL/h via INTRAVENOUS
  Administered 2011-12-06: 500 mL via INTRAVENOUS

## 2011-12-05 NOTE — Progress Notes (Signed)
 TELEMETRY: Reviewed telemetry pt in atrial fibrillation with controlled rate: Filed Vitals:   12/05/11 0018 12/05/11 0438 12/05/11 1024 12/05/11 1415  BP: 129/90 150/89 146/68 124/81  Pulse: 98 91 88 80  Temp:  98.2 F (36.8 C)  97.8 F (36.6 C)  TempSrc:  Oral  Oral  Resp: 21 20  20  Height:      Weight:  84.9 kg (187 lb 2.7 oz)    SpO2: 93% 97%  96%    Intake/Output Summary (Last 24 hours) at 12/05/11 1718 Last data filed at 12/05/11 1415  Gross per 24 hour  Intake    600 ml  Output   5000 ml  Net  -4400 ml    SUBJECTIVE Feeling much better. Less SOB and swelling. No chest pain. Concerned about cost of hospitalization.  LABS: Basic Metabolic Panel:  Basename 12/05/11 0930 12/05/11 0430  NA 134* 139  K 3.3* 3.4*  CL 98 101  CO2 25 25  GLUCOSE 110* 88  BUN 25* 26*  CREATININE 1.54* 1.62*  CALCIUM 8.6 8.8  MG -- --  PHOS -- --   CBC:  Basename 12/04/11 2205  WBC 9.6  NEUTROABS 6.9  HGB 10.8*  HCT 33.9*  MCV 81.9  PLT 229   Cardiac Enzymes:  Basename 12/05/11 0930 12/05/11 0430 12/04/11 2205  CKTOTAL -- -- --  CKMB -- -- --  CKMBINDEX -- -- --  TROPONINI <0.30 <0.30 <0.30   BNP: 7027  Radiology/Studies:  Dg Chest 2 View  12/04/2011  *RADIOLOGY REPORT*  Clinical Data: History of congestive heart failure and shortness of breath.  CHEST - 2 VIEW  Comparison: 11/07/2010.  Findings: The heart is enlarged. The mediastinal and hilar contours are normal.  There is patchy, mixed, interstitial and airspace disease in the lung bases.  There is some thickening along the fissures.  There heart tiny pleural effusions as well blunting the costophrenic angles posteriorly.  There are no pneumothoraces. There are no acute bony changes.  IMPRESSION: Congestive heart failure with basilar pulmonary edema.  No focal abnormalities.   Original Report Authenticated By: MICHAEL G. CLOUTIER, M.D.     PHYSICAL EXAM General: Well developed, well nourished, in no acute  distress. Head: Normocephalic, atraumatic, sclera non-icteric, no xanthomas, nares are without discharge. Neck: Negative for carotid bruits. JVD elevated to 6-8 cm Lungs: Bibasilar rales. Breathing is unlabored. Heart: IRRR S1 S2 without murmurs, rubs, or gallops.  Abdomen: Soft, non-tender, non-distended with normoactive bowel sounds. No hepatomegaly. No rebound/guarding. No obvious abdominal masses. Msk:  Strength and tone appears normal for age. Extremities: 2+ edema.  Distal pedal pulses are 2+ and equal bilaterally. Neuro: Alert and oriented X 3. Moves all extremities spontaneously. Psych:  Responds to questions appropriately with a normal affect.  ASSESSMENT AND PLAN: 1. Acute on chronic combined systolic and diastolic congestive heart failure. Patient is diuresing very well on IV Lasix. I/O down 2.7 L. Weight down 2.5 kg. Patient is still significantly volume overloaded. Ejection fraction recently of 20%. This is a significant drop from prior evaluation. I suspect this is predominantly related to long-standing severe hypertension and recent onset of atrial fibrillation. We will continue with IV diuresis. I expect he has at least another 10 pounds to lose. We will switch his labetalol to carvedilol. Continue ARB. Continue hydralazine. He is probably not a great candidate for Aldactone given his chronic kidney disease. I think clinically he will improve with restoration of sinus rhythm. If his ejection fraction does   not improve with optimization of his medications and restoration of sinus rhythm he would need to be considered for a biventricular ICD.  2. Atrial fibrillation of recent onset. He is therapeutic on Coumadin but has been on therapy for less than 4 weeks. His rate is well controlled. I will start him on amiodarone 400 mg twice a day orally. I think it is unlikely he will maintain sinus rhythm without antiarrhythmic drug therapy. Will plan a TEE/cardioversion tomorrow.  3. Chronic  kidney disease stage III.  4. Coronary disease status post remote bare-metal stent to the mid LAD in 2000. Patient had in-stent restenosis in March of 2012 treated with drug-eluting stent. Also had a DES to the ramus intermediate at that time. He had severe diagonal disease that was not amenable to PCI. He has had no recent anginal symptoms. Given the lack of anginal symptoms, chronic kidney disease, and the fact that he is fully anticoagulated I would not recommend cardiac catheterization at this time. We'll focus on rhythm management and heart failure treatment.  5. Hypertension-patient has had severe uncontrolled hypertension since age 12. Blood pressure is actually improved with atrial fibrillation. Currently well controlled.  6. History of CVA.  7. Hyperlipidemia. Continue statin therapy.  8. Abnormal TSH. Recent TSH was mildly elevated. We will repeat and check a T4 as well.  9. Anemia. Recent stools were heme negative. Will check anemia panel.  10. Hypokalemia- replete  Principal Problem:  *Heart failure, acute on chronic, systolic and diastolic Active Problems:  HYPERTENSION  CAD (coronary artery disease)  Hypercholesteremia  CKD (chronic kidney disease) stage 3, GFR 30-59 ml/min  S/P coronary artery stent placement  Atrial fibrillation    Signed, Kaliegh Willadsen MD,FACC 12/05/2011     

## 2011-12-05 NOTE — Progress Notes (Signed)
Patient arrived to unit via self from home.  Patient in no acute distress and appears comfortable.  MD Adolm Asmar notified. RN will continue to monitor. Louretta Parma, RN

## 2011-12-05 NOTE — Progress Notes (Signed)
Subjective: Patient feeling a little better.  Felt bad at home  Breathing is some better  No CP Objective: Filed Vitals:   12/04/11 2023 12/04/11 2310 12/05/11 0018 12/05/11 0438  BP: 143/91 138/97 129/90 150/89  Pulse: 100 83 98 91  Temp: 98.7 F (37.1 C) 98.1 F (36.7 C)  98.2 F (36.8 C)  TempSrc: Oral Oral  Oral  Resp: 22 21 21 20   Height: 5\' 10"  (1.778 m)     Weight: 192 lb 8 oz (87.317 kg)   187 lb 2.7 oz (84.9 kg)  SpO2: 95% 96% 93% 97%   Weight change:   Intake/Output Summary (Last 24 hours) at 12/05/11 0823 Last data filed at 12/05/11 7829  Gross per 24 hour  Intake    120 ml  Output   2900 ml  Net  -2780 ml    General: Alert, awake, oriented x3, in no acute distress Neck:  JVP is normal Heart: Regular rate and rhythm, without murmurs, rubs, gallops.  Lungs: Clear to auscultation.  No rales or wheezes. Exemities:  No edema.   Neuro: Grossly intact, nonfocal.  Tele: Afib  Rates 70 to 80s  Lab Results: Results for orders placed during the hospital encounter of 12/04/11 (from the past 24 hour(s))  CBC WITH DIFFERENTIAL     Status: Abnormal   Collection Time   12/04/11 10:05 PM      Component Value Range   WBC 9.6  4.0 - 10.5 K/uL   RBC 4.14 (*) 4.22 - 5.81 MIL/uL   Hemoglobin 10.8 (*) 13.0 - 17.0 g/dL   HCT 56.2 (*) 13.0 - 86.5 %   MCV 81.9  78.0 - 100.0 fL   MCH 26.1  26.0 - 34.0 pg   MCHC 31.9  30.0 - 36.0 g/dL   RDW 78.4  69.6 - 29.5 %   Platelets 229  150 - 400 K/uL   Neutrophils Relative 72  43 - 77 %   Neutro Abs 6.9  1.7 - 7.7 K/uL   Lymphocytes Relative 16  12 - 46 %   Lymphs Abs 1.5  0.7 - 4.0 K/uL   Monocytes Relative 10  3 - 12 %   Monocytes Absolute 0.9  0.1 - 1.0 K/uL   Eosinophils Relative 2  0 - 5 %   Eosinophils Absolute 0.2  0.0 - 0.7 K/uL   Basophils Relative 0  0 - 1 %   Basophils Absolute 0.0  0.0 - 0.1 K/uL  BASIC METABOLIC PANEL     Status: Abnormal   Collection Time   12/04/11 10:05 PM      Component Value Range   Sodium  137  135 - 145 mEq/L   Potassium 3.7  3.5 - 5.1 mEq/L   Chloride 100  96 - 112 mEq/L   CO2 26  19 - 32 mEq/L   Glucose, Bld 113 (*) 70 - 99 mg/dL   BUN 28 (*) 6 - 23 mg/dL   Creatinine, Ser 2.84 (*) 0.50 - 1.35 mg/dL   Calcium 8.9  8.4 - 13.2 mg/dL   GFR calc non Af Amer 40 (*) >90 mL/min   GFR calc Af Amer 46 (*) >90 mL/min  TROPONIN I     Status: Normal   Collection Time   12/04/11 10:05 PM      Component Value Range   Troponin I <0.30  <0.30 ng/mL  PRO B NATRIURETIC PEPTIDE     Status: Abnormal   Collection Time   12/04/11  10:05 PM      Component Value Range   Pro B Natriuretic peptide (BNP) 7027.0 (*) 0 - 125 pg/mL  BASIC METABOLIC PANEL     Status: Abnormal   Collection Time   12/05/11  4:30 AM      Component Value Range   Sodium 139  135 - 145 mEq/L   Potassium 3.4 (*) 3.5 - 5.1 mEq/L   Chloride 101  96 - 112 mEq/L   CO2 25  19 - 32 mEq/L   Glucose, Bld 88  70 - 99 mg/dL   BUN 26 (*) 6 - 23 mg/dL   Creatinine, Ser 9.60 (*) 0.50 - 1.35 mg/dL   Calcium 8.8  8.4 - 45.4 mg/dL   GFR calc non Af Amer 42 (*) >90 mL/min   GFR calc Af Amer 49 (*) >90 mL/min  TROPONIN I     Status: Normal   Collection Time   12/05/11  4:30 AM      Component Value Range   Troponin I <0.30  <0.30 ng/mL  PROTIME-INR     Status: Abnormal   Collection Time   12/05/11  4:30 AM      Component Value Range   Prothrombin Time 30.9 (*) 11.6 - 15.2 seconds   INR 3.19 (*) 0.00 - 1.49    Studies/Results: @[id number] @  Medications:Reviewed   Patient Active Hospital Problem List: 1.  CHF  Diuresing  IMproved some but still with volume increase Will continue IV lasix 2. Afib  With symptoms will plan d/c cardioversion for tomorrow if TEE negative. 3.  CAD  N symptoms of angina 4.  Renal  Follow 5.  K  Replete   LOS: 1 day   [name] 12/05/2011, 8:23 AM

## 2011-12-05 NOTE — Clinical Documentation Improvement (Signed)
CKD DOCUMENTATION CLARIFICATION QUERY   THIS DOCUMENT IS NOT A PERMANENT PART OF THE MEDICAL RECORD   Please update your documentation within the medical record to reflect your response to this query.                                                                                        12/05/11   Dr. Tenny Craw and/or Associates,  In a better effort to capture your patient's severity of illness, reflect appropriate length of stay and utilization of resources, a review of the patient medical record has revealed the following indicators:  "CKD" documented in H&P  BUN/Cr/GFR  this admission    (white male) 28/1.71/40 26/1.62/42 25/1.54/45  2012 Range BUN/Cr/GFR   (white male)  BUN  22-32 Cr.  1.53-1.89 GFR  36-46  Based on your clinical judgment, please document the STAGE of the patient's CKD in the progress notes and discharge summary:   - CKD Stage I -  GFR > OR = 90  - CKD Stage II - GFR 60-89  - CKD Stage III - GFR 30-59  - CKD Stage IV - GFR 15-29  - CKD Stage V - GFR < 15  - ESRD (End Stage Renal Disease)  - Other Condition  - Unable to Clinically Determine    In responding to this query please exercise your independent judgment.    The fact that a query is asked, does not imply that any particular answer is desired or expected.   Reviewed: 12/06/11 - stage 3 ckd documented by Dr. Swaziland 12/05/11.  Mathis Dad RN   Thank You,  Jerral Ralph  RN BSN CCDS Certified Clinical Documentation Specialist: Cell   [phone]  Health Information Management [place]  TO RESPOND TO THE THIS QUERY, FOLLOW THE INSTRUCTIONS BELOW:  1. If needed, update documentation for the patient's encounter via the notes activity.  2. Access this query again and click edit on the In [employer].  3. After updating, or not, click F2 to complete all highlighted (required) fields concerning your review. Select "additional documentation in the medical record" OR "no additional  documentation provided".  4. Click Sign note button.  5. The deficiency will fall out of your In Basket *Please let us know if you are not able to complete this workflow by phone or e-mail (listed below).

## 2011-12-05 NOTE — Plan of Care (Signed)
Problem: Phase I Progression Outcomes Goal: EF % per last Echo/documented,Core Reminder form on chart Outcome: Completed/Met Date Met:  12/05/11 Echo done August 2013 showed EF of 20%. Louretta Parma, RN

## 2011-12-05 NOTE — Progress Notes (Signed)
UR done. 

## 2011-12-05 NOTE — Progress Notes (Signed)
ANTICOAGULATION CONSULT NOTE - Follow-Up Consult  Pharmacy Consult for Coumadin Indication: atrial fibrillation  Allergies  Allergen Reactions  . Ace Inhibitors Other (See Comments)    Unknown reaction  . Clonidine Derivatives Other (See Comments)    Unknown reaction    Patient Measurements: Height: 5\' 10"  (177.8 cm) Weight: 187 lb 2.7 oz (84.9 kg) (A scale) IBW/kg (Calculated) : 73  Vital Signs: Temp: 98.2 F (36.8 C) (10/24 0438) Temp src: Oral (10/24 0438) BP: 150/89 mmHg (10/24 0438) Pulse Rate: 91  (10/24 0438)  Labs:  Basename 12/05/11 0430 12/04/11 2205 12/04/11 1603  HGB -- 10.8* --  HCT -- 33.9* --  PLT -- 229 --  APTT -- -- --  LABPROT 30.9* -- --  INR 3.19* -- 3.5  HEPARINUNFRC -- -- --  CREATININE 1.62* 1.71* --  CKTOTAL -- -- --  CKMB -- -- --  TROPONINI <0.30 <0.30 --    Estimated Creatinine Clearance: 45.7 ml/min (by C-G formula based on Cr of 1.62).  Assessment: 26 YOM admitted with SOB and 13 lb weight gain on Coumadin prior to admission for new onset Afib with tentative plans for cardioversion in new future. INR today is 3.19- supra-therapeutic but trending down. Home regimen was 5mg  daily except 7.5mg  on Wednesday. Patient reports taking last dose 10/23.  Goal of Therapy:  INR 2-3   Plan:  1. Coumadin 2.5 mg tonight 2. Follow-up daily PT/INR.   Christoper Fabian, PharmD, BCPS Clinical pharmacist, pager [phone] 12/05/2011,8:35 AM

## 2011-12-06 ENCOUNTER — Encounter (HOSPITAL_COMMUNITY): Payer: Self-pay | Admitting: *Deleted

## 2011-12-06 ENCOUNTER — Encounter (HOSPITAL_COMMUNITY): Payer: Self-pay

## 2011-12-06 ENCOUNTER — Telehealth: Payer: Self-pay | Admitting: Cardiology

## 2011-12-06 ENCOUNTER — Encounter (HOSPITAL_COMMUNITY)
Admission: AD | Disposition: A | Discharge status: Home / Self Care | Payer: Self-pay | Source: Ambulatory Visit | Attending: Cardiovascular Disease

## 2011-12-06 DIAGNOSIS — I5040 Unspecified combined systolic (congestive) and diastolic (congestive) heart failure: Secondary | ICD-10-CM

## 2011-12-06 DIAGNOSIS — I4891 Unspecified atrial fibrillation: Secondary | ICD-10-CM

## 2011-12-06 HISTORY — PX: TEE WITHOUT CARDIOVERSION: SHX5443

## 2011-12-06 LAB — IRON AND TIBC
Saturation Ratios: 4 % — ABNORMAL LOW (ref 20–55)
UIBC: 387 ug/dL (ref 125–400)

## 2011-12-06 LAB — BASIC METABOLIC PANEL
Calcium: 8.5 mg/dL (ref 8.4–10.5)
Chloride: 99 mEq/L (ref 96–112)
Creatinine, Ser: 1.66 mg/dL — ABNORMAL HIGH (ref 0.50–1.35)
Creatinine, Ser: 1.6 mg/dL — ABNORMAL HIGH (ref 0.50–1.35)
GFR calc Af Amer: 48 mL/min — ABNORMAL LOW (ref >90)
GFR calc Af Amer: 50 mL/min — ABNORMAL LOW (ref >90)
GFR calc non Af Amer: 41 mL/min — ABNORMAL LOW (ref >90)
Potassium: 4.2 mEq/L (ref 3.5–5.1)
Sodium: 136 mEq/L (ref 135–145)

## 2011-12-06 LAB — PROTIME-INR: Prothrombin Time: 29.2 seconds — ABNORMAL HIGH (ref 11.6–15.2)

## 2011-12-06 LAB — FERRITIN: Ferritin: 14 ng/mL — ABNORMAL LOW (ref 22–322)

## 2011-12-06 LAB — RETICULOCYTES
RBC.: 4.3 MIL/uL (ref 4.22–5.81)
Retic Count, Absolute: 73.1 10*3/uL (ref 19.0–186.0)

## 2011-12-06 SURGERY — ECHOCARDIOGRAM, TRANSESOPHAGEAL
Anesthesia: Moderate Sedation

## 2011-12-06 MED ORDER — LIDOCAINE VISCOUS 2 % MT SOLN
OROMUCOSAL | Status: AC
  Filled 2011-12-06: qty 15

## 2011-12-06 MED ORDER — MIDAZOLAM HCL 5 MG/ML IJ SOLN
INTRAMUSCULAR | Status: AC
  Filled 2011-12-06: qty 2

## 2011-12-06 MED ORDER — MIDAZOLAM HCL 10 MG/2ML IJ SOLN
INTRAMUSCULAR | Status: DC | PRN
  Administered 2011-12-06: 1 mg via INTRAVENOUS
  Administered 2011-12-06: 2 mg via INTRAVENOUS

## 2011-12-06 MED ORDER — FENTANYL CITRATE 0.05 MG/ML IJ SOLN
INTRAMUSCULAR | Status: AC
  Filled 2011-12-06: qty 2

## 2011-12-06 MED ORDER — ASPIRIN 81 MG PO CHEW
CHEWABLE_TABLET | ORAL | Status: AC
  Administered 2011-12-06: 81 mg
  Filled 2011-12-06: qty 1

## 2011-12-06 MED ORDER — HYDRALAZINE HCL 50 MG PO TABS
75.0000 mg | ORAL_TABLET | Freq: Three times a day (TID) | ORAL | Status: DC
  Administered 2011-12-06 – 2011-12-07 (×4): 75 mg via ORAL
  Filled 2011-12-06 (×7): qty 1

## 2011-12-06 MED ORDER — FENTANYL CITRATE 0.05 MG/ML IJ SOLN
INTRAMUSCULAR | Status: DC | PRN
  Administered 2011-12-06: 25 ug via INTRAVENOUS

## 2011-12-06 MED ORDER — AMIODARONE HCL 200 MG PO TABS
400.0000 mg | ORAL_TABLET | Freq: Every day | ORAL | Status: DC
  Administered 2011-12-07: 400 mg via ORAL
  Filled 2011-12-06: qty 2

## 2011-12-06 MED ORDER — GLUCOSE 40 % PO GEL
ORAL | Status: AC
  Filled 2011-12-06: qty 1

## 2011-12-06 MED ORDER — WARFARIN SODIUM 2.5 MG PO TABS
2.5000 mg | ORAL_TABLET | Freq: Once | ORAL | Status: AC
  Administered 2011-12-06: 2.5 mg via ORAL
  Filled 2011-12-06: qty 1

## 2011-12-06 MED ORDER — LIDOCAINE VISCOUS 2 % MT SOLN
OROMUCOSAL | Status: DC | PRN
  Administered 2011-12-06: 10 mL via OROMUCOSAL

## 2011-12-06 NOTE — Anesthesia Postprocedure Evaluation (Signed)
  Anesthesia Post-op Note  Patient: Erik Adams  Procedure(s) Performed: Procedure(s) (LRB) with comments: TRANSESOPHAGEAL ECHOCARDIOGRAM (TEE) (N/A)  Patient Location:   Anesthesia Type:   Level of Consciousness:   Airway and Oxygen Therapy:   Post-op Pain:   Post-op Assessment:   Post-op Vital Signs:   Complications: Case cancelled

## 2011-12-06 NOTE — Interval H&P Note (Signed)
History and Physical Interval Note:  12/06/2011 9:33 AM  Erik Adams  has presented today for surgery, with the diagnosis of atrial fibrillation  The various methods of treatment have been discussed with the patient and family. After consideration of risks, benefits and other options for treatment, the patient has consented to  Procedure(s) (LRB) with comments: TRANSESOPHAGEAL ECHOCARDIOGRAM (TEE) (N/A) CARDIOVERSION (N/A) as a surgical intervention .  The patient's history has been reviewed, patient examined, no change in status, stable for surgery.  I have reviewed the patient's chart and labs.  Questions were answered to the patient's satisfaction.     [name]

## 2011-12-06 NOTE — Anesthesia Preprocedure Evaluation (Signed)
Anesthesia Evaluation  Patient identified by MRN, date of birth, ID band  Reviewed: Allergy & Precautions, H&P , NPO status , Patient's Chart, lab work & pertinent test results, reviewed documented beta blocker date and time   History of Anesthesia Complications Negative for: history of anesthetic complications  Airway       Dental   Pulmonary          Cardiovascular hypertension, Pt. on home beta blockers + CAD and +CHF + dysrhythmias Atrial Fibrillation     Neuro/Psych    GI/Hepatic   Endo/Other    Renal/GU Renal InsufficiencyRenal disease     Musculoskeletal   Abdominal   Peds  Hematology   Anesthesia Other Findings   Reproductive/Obstetrics                           Anesthesia Physical Anesthesia Plan Anesthesia Quick Evaluation

## 2011-12-06 NOTE — Preoperative (Signed)
Beta Blockers   Reason not to administer Beta Blockers:Not Applicable 

## 2011-12-06 NOTE — Op Note (Signed)
Small thrombus seen in LA appendage. FUll report to follow

## 2011-12-06 NOTE — Transfer of Care (Signed)
Immediate Anesthesia Transfer of Care Note  Patient: Erik Adams  Procedure(s) Performed: Procedure(s) (LRB) with comments: TRANSESOPHAGEAL ECHOCARDIOGRAM (TEE) (N/A)  Case cancelled by dr Tenny Craw  :       Post vital signs:   Complications:

## 2011-12-06 NOTE — Telephone Encounter (Signed)
Pt in hospital , had his heart shocked, wants to talk to nurse re being discharged

## 2011-12-06 NOTE — Progress Notes (Signed)
Patient had 5 beats of VTACH. Patient asymptomatic and in no acute distress. MD Nicholaus Bloom aware and Mag level ordered for the AM. RN will continue to monitor. Louretta Parma, RN

## 2011-12-06 NOTE — Care Management Note (Unsigned)
    Page 1 of 1   12/06/2011     2:39:07 PM   CARE MANAGEMENT NOTE 12/06/2011  Patient:  DAMEIAN, LOZO   Account Number:  [id number]  Date Initiated:  12/06/2011  Documentation initiated by:  CRAFT,TERRI  Subjective/Objective Assessment:   66 yo male admitted 12/04/11 with CHF, Afib     Action/Plan:   D/C when medically stable   Anticipated DC Date:  12/09/2011   Anticipated DC Plan:  HOME W HOME HEALTH SERVICES      DC Planning Services  CM consult      [facility] Choice  HOME HEALTH   Choice offered to / List presented to:  C-1 Patient        HH arranged  HH-1 RN      [facility] agency  [facility]   Status of service:  In process, will continue to follow   Per UR Regulation:  Reviewed for med. necessity/level of care/duration of stay  Comments:  12/06/11, Kathi Der RNC-MNN, BSN, [phone], CM received referral and met with pt to offer choice for [facility] services. Pt chose [facility].  Mary at [facility] called with order and confirmation received.

## 2011-12-06 NOTE — Progress Notes (Signed)
ANTICOAGULATION CONSULT NOTE - Follow-Up Consult  Pharmacy Consult for Coumadin Indication: atrial fibrillation; small thrombus in LA appendage  Allergies  Allergen Reactions  . Ace Inhibitors Other (See Comments)    Unknown reaction  . Clonidine Derivatives Other (See Comments)    Unknown reaction    Patient Measurements: Height: 5\' 10"  (177.8 cm) Weight: 180 lb 1.6 oz (81.693 kg) (scale a) IBW/kg (Calculated) : 73  Vital Signs: Temp: 97.5 F (36.4 C) (10/25 1053) Temp src: Oral (10/25 1053) BP: 133/96 mmHg (10/25 1236) Pulse Rate: 82  (10/25 1053)  Labs:  Basename 12/06/11 0253 12/05/11 0930 12/05/11 0430 12/04/11 2205 12/04/11 1603  HGB -- -- -- 10.8* --  HCT -- -- -- 33.9* --  PLT -- -- -- 229 --  APTT -- -- -- -- --  LABPROT 29.2* -- 30.9* -- --  INR 2.95* -- 3.19* -- 3.5  HEPARINUNFRC -- -- -- -- --  CREATININE 1.66* 1.54* 1.62* -- --  CKTOTAL -- -- -- -- --  CKMB -- -- -- -- --  TROPONINI -- <0.30 <0.30 <0.30 --    Estimated Creatinine Clearance: 44.6 ml/min (by C-G formula based on Cr of 1.66).  Assessment: 49 YOM admitted on coumadin PTA for new onset afib. Plans for cardioversion today - unfortunately TEE showed small thombus in LA appendage. Unable to cardiovert. INR today is 2.95-therapeutic and trending down. Home regimen was 5mg  daily except 7.5mg  on Wednesday. Noted pt now started on new amio which will increase effect of coumadin  Goal of Therapy:  INR 2-3   Plan:  1. Coumadin 2.5 mg tonight 2. Follow-up daily PT/INR.   Christoper Fabian, PharmD, BCPS Clinical pharmacist, pager [phone] 12/06/2011,1:07 PM

## 2011-12-06 NOTE — Progress Notes (Signed)
  Echocardiogram Echocardiogram Transesophageal has been performed.  Erik Adams [name] 12/06/2011, 10:23 AM

## 2011-12-06 NOTE — Telephone Encounter (Signed)
Spoke to patient he stated he is presently in hospital.States cardioversion was cancelled this am.States he has questions for Dr.Jordan and is ready to be discharged.Patient was told to check with nurse taking care of him,Dr.Jordan is not in office this afternoon.

## 2011-12-06 NOTE — H&P (View-Only) (Signed)
TELEMETRY: Reviewed telemetry pt in atrial fibrillation with controlled rate: Filed Vitals:   12/05/11 0018 12/05/11 0438 12/05/11 1024 12/05/11 1415  BP: 129/90 150/89 146/68 124/81  Pulse: 98 91 88 80  Temp:  98.2 F (36.8 C)  97.8 F (36.6 C)  TempSrc:  Oral  Oral  Resp: 21 20  20   Height:      Weight:  84.9 kg (187 lb 2.7 oz)    SpO2: 93% 97%  96%    Intake/Output Summary (Last 24 hours) at 12/05/11 1718 Last data filed at 12/05/11 1415  Gross per 24 hour  Intake    600 ml  Output   5000 ml  Net  -4400 ml    SUBJECTIVE Feeling much better. Less SOB and swelling. No chest pain. Concerned about cost of hospitalization.  LABS: Basic Metabolic Panel:  Basename 12/05/11 0930 12/05/11 0430  NA 134* 139  K 3.3* 3.4*  CL 98 101  CO2 25 25  GLUCOSE 110* 88  BUN 25* 26*  CREATININE 1.54* 1.62*  CALCIUM 8.6 8.8  MG -- --  PHOS -- --   CBC:  Basename 12/04/11 2205  WBC 9.6  NEUTROABS 6.9  HGB 10.8*  HCT 33.9*  MCV 81.9  PLT 229   Cardiac Enzymes:  Basename 12/05/11 0930 12/05/11 0430 12/04/11 2205  CKTOTAL -- -- --  CKMB -- -- --  CKMBINDEX -- -- --  TROPONINI <0.30 <0.30 <0.30   BNP: 7027  Radiology/Studies:  Dg Chest 2 View  12/04/2011  *RADIOLOGY REPORT*  Clinical Data: History of congestive heart failure and shortness of breath.  CHEST - 2 VIEW  Comparison: 11/07/2010.  Findings: The heart is enlarged. The mediastinal and hilar contours are normal.  There is patchy, mixed, interstitial and airspace disease in the lung bases.  There is some thickening along the fissures.  There heart tiny pleural effusions as well blunting the costophrenic angles posteriorly.  There are no pneumothoraces. There are no acute bony changes.  IMPRESSION: Congestive heart failure with basilar pulmonary edema.  No focal abnormalities.   Original Report Authenticated By: Mervin Hack, M.D.     PHYSICAL EXAM General: Well developed, well nourished, in no acute  distress. Head: Normocephalic, atraumatic, sclera non-icteric, no xanthomas, nares are without discharge. Neck: Negative for carotid bruits. JVD elevated to 6-8 cm Lungs: Bibasilar rales. Breathing is unlabored. Heart: IRRR S1 S2 without murmurs, rubs, or gallops.  Abdomen: Soft, non-tender, non-distended with normoactive bowel sounds. No hepatomegaly. No rebound/guarding. No obvious abdominal masses. Msk:  Strength and tone appears normal for age. Extremities: 2+ edema.  Distal pedal pulses are 2+ and equal bilaterally. Neuro: Alert and oriented X 3. Moves all extremities spontaneously. Psych:  Responds to questions appropriately with a normal affect.  ASSESSMENT AND PLAN: 1. Acute on chronic combined systolic and diastolic congestive heart failure. Patient is diuresing very well on IV Lasix. I/O down 2.7 L. Weight down 2.5 kg. Patient is still significantly volume overloaded. Ejection fraction recently of 20%. This is a significant drop from prior evaluation. I suspect this is predominantly related to long-standing severe hypertension and recent onset of atrial fibrillation. We will continue with IV diuresis. I expect he has at least another 10 pounds to lose. We will switch his labetalol to carvedilol. Continue ARB. Continue hydralazine. He is probably not a great candidate for Aldactone given his chronic kidney disease. I think clinically he will improve with restoration of sinus rhythm. If his ejection fraction does  not improve with optimization of his medications and restoration of sinus rhythm he would need to be considered for a biventricular ICD.  2. Atrial fibrillation of recent onset. He is therapeutic on Coumadin but has been on therapy for less than 4 weeks. His rate is well controlled. I will start him on amiodarone 400 mg twice a day orally. I think it is unlikely he will maintain sinus rhythm without antiarrhythmic drug therapy. Will plan a TEE/cardioversion tomorrow.  3. Chronic  kidney disease stage III.  4. Coronary disease status post remote bare-metal stent to the mid LAD in 2000. Patient had in-stent restenosis in March of 2012 treated with drug-eluting stent. Also had a DES to the ramus intermediate at that time. He had severe diagonal disease that was not amenable to PCI. He has had no recent anginal symptoms. Given the lack of anginal symptoms, chronic kidney disease, and the fact that he is fully anticoagulated I would not recommend cardiac catheterization at this time. We'll focus on rhythm management and heart failure treatment.  5. Hypertension-patient has had severe uncontrolled hypertension since age 2. Blood pressure is actually improved with atrial fibrillation. Currently well controlled.  6. History of CVA.  7. Hyperlipidemia. Continue statin therapy.  8. Abnormal TSH. Recent TSH was mildly elevated. We will repeat and check a T4 as well.  9. Anemia. Recent stools were heme negative. Will check anemia panel.  10. Hypokalemia- replete  Principal Problem:  *Heart failure, acute on chronic, systolic and diastolic Active Problems:  HYPERTENSION  CAD (coronary artery disease)  Hypercholesteremia  CKD (chronic kidney disease) stage 3, GFR 30-59 ml/min  S/P coronary artery stent placement  Atrial fibrillation    Signed, Peter Swaziland MD,FACC 12/05/2011

## 2011-12-06 NOTE — Progress Notes (Signed)
Subjective: Still SOB  No chest pain Objective: Filed Vitals:   12/06/11 0915 12/06/11 0920 12/06/11 0925 12/06/11 0930  BP: 157/107 166/105 159/107 164/109  Pulse:      Temp:      TempSrc:      Resp: 18 18 17 14   Height:      Weight:      SpO2: 95% 95% 96% 95%   Weight change: -12 lb 6.4 oz (-5.625 kg)  Intake/Output Summary (Last 24 hours) at 12/06/11 0937 Last data filed at 12/06/11 0700  Gross per 24 hour  Intake    840 ml  Output   3250 ml  Net  -2410 ml   Total   Neg 5500. General: Alert, awake, oriented x3, in no acute distress Neck:  JVP is increased Heart: Irregular rate and rhythm, II/VI systolic murmur, gallops.  Lungs:Crackles in L base to 1/3 up Exemities:  No edema.   Neuro: Grossly intact, nonfocal.  Tele:  5 beat NSVT.  Afib 90s.   Lab Results: Results for orders placed during the hospital encounter of 12/04/11 (from the past 24 hour(s))  BASIC METABOLIC PANEL     Status: Abnormal   Collection Time   12/06/11  2:53 AM      Component Value Range   Sodium 137  135 - 145 mEq/L   Potassium 3.7  3.5 - 5.1 mEq/L   Chloride 100  96 - 112 mEq/L   CO2 27  19 - 32 mEq/L   Glucose, Bld 101 (*) 70 - 99 mg/dL   BUN 28 (*) 6 - 23 mg/dL   Creatinine, Ser 1.61 (*) 0.50 - 1.35 mg/dL   Calcium 8.5  8.4 - 09.6 mg/dL   GFR calc non Af Amer 41 (*) >90 mL/min   GFR calc Af Amer 48 (*) >90 mL/min  PROTIME-INR     Status: Abnormal   Collection Time   12/06/11  2:53 AM      Component Value Range   Prothrombin Time 29.2 (*) 11.6 - 15.2 seconds   INR 2.95 (*) 0.00 - 1.49  RETICULOCYTES     Status: Normal   Collection Time   12/06/11  2:53 AM      Component Value Range   Retic Ct Pct 1.7  0.4 - 3.1 %   RBC. 4.30  4.22 - 5.81 MIL/uL   Retic Count, Manual 73.1  19.0 - 186.0 K/uL  PRO B NATRIURETIC PEPTIDE     Status: Abnormal   Collection Time   12/06/11  2:53 AM      Component Value Range   Pro B Natriuretic peptide (BNP) 4638.0 (*) 0 - 125 pg/mL  MAGNESIUM      Status: Normal   Collection Time   12/06/11  2:53 AM      Component Value Range   Magnesium 2.1  1.5 - 2.5 mg/dL    Studies/Results: @RISRSLT24 @  Medications: Reviewed   Patient Active Hospital Problem List: Heart failure, acute on chronic, systolic and diastolic (11/16/2010)   Assessment: Patient is diuresing.  Still with volume overload on exam.  Unfortunately TEE showed thrombu in LA appendage  LVEF is severely depressed.    Cardioversion will be postponed for another couple wks.  HYPERTENSION (11/26/2006)   Assessment: Bp is still high  Will increase hydralazine to 75 tid.  CAD (coronary artery disease) ()   Assessment: No active angina.  Hypercholesteremia ()   Assessment:  CKD (chronic kidney disease) stage 3, GFR 30-59 ml/min ()  Assessment: Cr is stable  Follow  Atrial fibrillation (11/13/2011)   Assessment: Could not do DCCV.  Will continue rate control  I would switch amio to 400 qd.  This should not convert him and will low in the next couple weeks as he continues anticoag    LOS: 2 days   [name] 12/06/2011, 9:37 AM

## 2011-12-07 ENCOUNTER — Encounter (HOSPITAL_COMMUNITY): Payer: Self-pay | Admitting: Cardiology

## 2011-12-07 LAB — BASIC METABOLIC PANEL
CO2: 27 mEq/L (ref 19–32)
Calcium: 9 mg/dL (ref 8.4–10.5)
Sodium: 138 mEq/L (ref 135–145)

## 2011-12-07 LAB — PROTIME-INR
INR: 2.21 — ABNORMAL HIGH (ref 0.00–1.49)
Prothrombin Time: 23.6 seconds — ABNORMAL HIGH (ref 11.6–15.2)

## 2011-12-07 MED ORDER — POTASSIUM CHLORIDE CRYS ER 20 MEQ PO TBCR: 40.0000 meq | EXTENDED_RELEASE_TABLET | Freq: Two times a day (BID) | ORAL | Status: DC

## 2011-12-07 MED ORDER — FUROSEMIDE 40 MG PO TABS: 60.0000 mg | ORAL_TABLET | Freq: Two times a day (BID) | ORAL | Status: DC

## 2011-12-07 MED ORDER — WARFARIN SODIUM 7.5 MG PO TABS
7.5000 mg | ORAL_TABLET | Freq: Once | ORAL | Status: DC
  Filled 2011-12-07: qty 1

## 2011-12-07 MED ORDER — HYDRALAZINE HCL 50 MG PO TABS: 75.0000 mg | ORAL_TABLET | Freq: Three times a day (TID) | ORAL | Status: DC

## 2011-12-07 MED ORDER — FERROUS SULFATE 325 (65 FE) MG PO TABS: 325.0000 mg | ORAL_TABLET | Freq: Every day | ORAL | Status: DC

## 2011-12-07 MED ORDER — AMIODARONE HCL 400 MG PO TABS: 400.0000 mg | ORAL_TABLET | Freq: Every day | ORAL | Status: DC

## 2011-12-07 MED ORDER — CARVEDILOL 25 MG PO TABS: 25.0000 mg | ORAL_TABLET | Freq: Two times a day (BID) | ORAL | Status: DC

## 2011-12-07 NOTE — Progress Notes (Signed)
   CARE MANAGEMENT NOTE 12/07/2011  Patient:  Erik Adams, Erik Adams   Account Number:  [id number]  Date Initiated:  12/06/2011  Documentation initiated by:  CRAFT,TERRI  Subjective/Objective Assessment:   66 yo male admitted 12/04/11 with CHF, Afib     Action/Plan:   D/C when medically stable   Anticipated DC Date:  12/09/2011   Anticipated DC Plan:  HOME W HOME HEALTH SERVICES      DC Planning Services  CM consult      [facility] Choice  HOME HEALTH   Choice offered to / List presented to:  C-1 Patient        HH arranged  HH-1 RN      [facility] agency  [facility]   Status of service:  In process, will continue to follow Medicare Important Message given?   (If response is "NO", the following Medicare IM given date fields will be blank) Date Medicare IM given:   Date Additional Medicare IM given:    Discharge Disposition:    Per UR Regulation:  Reviewed for med. necessity/level of care/duration of stay  If discussed at Long Length of Stay Meetings, dates discussed:    Comments:  12/07/2011 1730 NCM notified MD for orders for [facility] for [facility]. Orders recieved from [facility] PA for [facility] RN. Faxed orders to [employer].  Isidoro Donning RN CCM Case Mgmt phone [phone]  12/06/11, Kathi Der RNC-MNN, BSN, [phone], CM received referral and met with pt to offer choice for [facility] services. Pt chose [facility].  Mary at [facility] called with order and confirmation received.

## 2011-12-07 NOTE — Discharge Summary (Signed)
Discharge Summary   Patient ID: GUINN DELAROSA MRN: [id number], DOB/AGE: 67-Oct-1947 67 y.o.  Primary MD: [id number] Glean Hess, MD Primary Cardiologist: Dr. Peter Swaziland  Admit date: 12/04/2011 D/C date:     12/07/2011      Primary Discharge Diagnoses:  1. Acute on Chronic Systolic CHF  - Diuresed a total of 8.4L with weight 192lbs -->177lbs  - Dc'd on 60mg  Lasix BID, Coreg 25mg  bid  2. Atrial Fibrillation  - Anticoagulation with coumadin  - Plans for repeat TEE and DCCV if thrombus resolved  - Initiated on Amiodarone, dc'd on 400mg  daily  3. Left Atrial Appendage Thrombus  - Identified on TEE, continued on coumadin  4. Hypertension  - Hydralazine increased to 75mg  TID and labetalol changed to carvedilol  5. Elevated TSH  - TSH 6.34  - Will need repeat TSH/Free T4/T3 and f/u with PCP  6. Iron Deficiency Anemia  - Hgb 10.8, Iron 17, Ferritin 14  - No signs of active bleeding  - Initiated on Fe w/ plans for f/u w/ PCP   Secondary Discharge Diagnoses:  Past Medical History  Diagnosis Date  . CAD (coronary artery disease)     a. s/p BMS to mLAD 2000;  b. LHC 3/12:  sig ISR mLAD, RI 80-90%, Dx1 90% (too small for PCI);  PCI:  ION DES to mLAD, ION DES to RI  . HTN (hypertension)   . Hypercholesteremia   . CVA (cerebral infarction)   . CRI (chronic renal insufficiency)     Baseline creatinine of 1.4 to 1.7  . 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 EF is 45 to 50%;  b. echo 10/13:  inf HK, mod LVH, EF 20%, mild AI, mod MR, mod TR, mod BAE, PASP 46     Allergies Allergies  Allergen Reactions  . Ace Inhibitors Other (See Comments)    Unknown reaction  . Clonidine Derivatives Other (See Comments)    Unknown reaction    Diagnostic Studies/Procedures:   12/06/11 - TEE Left ventricle: LVEF is severely depressed at 20 to 25% with diffuse hypokinesis. Aortic valve: AV is mildly thickened, calcified. MIld AI. Aorta: Mild fixed plaque in  thoracic aorta. Mitral valve: MR appears mild to moderate. Left atrium: There is spontaneous echocontrast in LA appendage and an area at tip of appendage that has echodensisty consistent with thrombus. Pulmonic valve: PV is normal. Trace PI. Tricuspid valve: TV is normal. Mild TR.  History of Present Illness: 66 y.o. male w/ the above medical problems who was seen in clinic on 12/04/11 and found to be volume overloaded for which he was admitted to [facility] Course:  EKG revealed AFib, HR 95, normal axis, PRWP, TW inversions V5-6, no change from prior. CXR showed CHF with basilar pulmonary edema. Cardiac enzymes were cycled and remained negative. He was gently diuresed with IV Lasix with improvement in symptoms and volume status. Diuresed a total of 8.4L with weight 192lbs -->177lbs. Electrolytes were supplemented as needed. Anticoagulation was continued with Coumadin. His recent decline in EF (20%) and significant volume overload were felt due to long-standing severe hypertension and atrial fibrillation. It was felt he would benefit from restoration of sinus rhythm for which he was placed on amiodarone with plans for TEE guided DCCV. Unfortunately his TEE showed thrombus in the LA appendage and DCCV was cancelled. Amiodarone was decreased to 400mg  daily. Hydralazine was increased to 75mg  TID and labetalol changed to carvedilol.  On admission Hgb was 10.8 and follow up Anemia panel showed low iron and ferritin. There were no signs of active bleeding. He was initiated on Fe and instructed to monitor for signs of bleeding and follow up with his primary care provider. His TSH was elevated at 6.34 for which he was instructed to follow up with his primary care provider for further evaluation. He was seen and evaluated by Dr. Ladona Ridgel who felt he was stable for discharge home with plans for follow up as scheduled below.  Discharge Vitals: Blood pressure 152/88, pulse 102, temperature 98.2 F  (36.8 C), temperature source Oral, resp. rate 18, height 5\' 10"  (1.778 m), weight 177 lb 1.6 oz (80.332 kg), SpO2 94.00%.  Labs: Component Value Date   WBC 9.6 12/04/2011   HGB 10.8* 12/04/2011   HCT 33.9* 12/04/2011   MCV 81.9 12/04/2011   PLT 229 12/04/2011     12/06/2011 02:53  Iron 17 (L)  UIBC 387  TIBC 404  Saturation Ratios 4 (L)  Ferritin 14 (L)  Folate >20.0    12/07/2011 05:40  Prothrombin Time 23.6 (H)  INR 2.21 (H)   Lab 12/07/11 0540  NA 138  K 4.1  CL 100  CO2 27  BUN 23  CREATININE 1.55*  CALCIUM 9.0  GLUCOSE 102*   Basename 12/05/11 0930 12/05/11 0430 12/04/11 2205  TROPONINI <0.30 <0.30 <0.30     12/04/2011 22:05 12/06/2011 02:53  Pro B Natriuretic peptide (BNP) 7027.0 (H) 4638.0 (H)     11/13/2011 16:17  TSH 6.34 (H)   Discharge Medications     Medication List     As of 12/07/2011 12:40 PM    STOP taking these medications         labetalol 200 MG tablet   Commonly known as: NORMODYNE      TAKE these medications         amiodarone 400 MG tablet   Commonly known as: PACERONE   Take 1 tablet (400 mg total) by mouth daily.      amLODipine 10 MG tablet   Commonly known as: NORVASC   Take 10 mg by mouth daily.      aspirin EC 81 MG tablet   Take 1 tablet (81 mg total) by mouth daily.      carvedilol 25 MG tablet   Commonly known as: COREG   Take 1 tablet (25 mg total) by mouth 2 (two) times daily with a meal.      ferrous sulfate 325 (65 FE) MG tablet   Take 1 tablet (325 mg total) by mouth daily with breakfast.      furosemide 40 MG tablet   Commonly known as: LASIX   Take 1.5 tablets (60 mg total) by mouth 2 (two) times daily.      hydrALAZINE 50 MG tablet   Commonly known as: APRESOLINE   Take 1.5 tablets (75 mg total) by mouth 3 (three) times daily.      nitroGLYCERIN 0.4 MG SL tablet   Commonly known as: NITROSTAT   Place 0.4 mg under the tongue every 5 (five) minutes as needed. For chest pain      olmesartan 40 MG  tablet   Commonly known as: BENICAR   Take 40 mg by mouth daily.      potassium chloride SA 20 MEQ tablet   Commonly known as: K-DUR,KLOR-CON   Take 2 tablets (40 mEq total) by mouth 2 (two) times daily.      rosuvastatin  10 MG tablet   Commonly known as: CRESTOR   Take 10 mg by mouth daily.      warfarin 5 MG tablet   Commonly known as: COUMADIN   Take 5 mg by mouth See admin instructions. Take 1 1/2 tablet (7.5 mg) on Wednesday and take 1 tablet (5 mg) on all other days          Disposition   Discharge Orders    Future Appointments: Provider: Department: Dept Phone: Center:   12/11/2011 4:00 PM [facility] [phone] None   12/27/2011 3:00 PM Peter M Swaziland, MD [facility] [phone] [facility]     Future Orders Please Complete By Expires   Diet - low sodium heart healthy      Increase activity slowly      Discharge instructions      Comments:   * Please take all medications as prescribed.  * Patients on amiodarone may require additional periodic monitoring of other organ systems, such as lungs, thyroid, eyes, and liver function. Please talk to your doctor at your follow-up appointments about what monitoring may be necessary for you.  * Your iron and blood counts were found to be low. Please monitor for signs of bleeding and follow up with your primary care provider regarding your low blood counts, low iron, and abnormal thyroid blood levels.        Outstanding Labs/Studies:  1. BMET 2. TSH/FreeT4/T3 3. CBC  Duration of Discharge Encounter: Greater than 30 minutes including physician and PA time.  Signed, Davion Flannery PA-C 12/07/2011, 12:40 PM

## 2011-12-07 NOTE — Progress Notes (Signed)
Patient ID: Erik Adams, male   DOB: 20-Dec-1945, 66 y.o.   MRN: [id number] Subjective:  I feel good. I want to go home.  Objective:  Vital Signs in the last 24 hours: Temp:  [97.5 F (36.4 C)-98.5 F (36.9 C)] 98.2 F (36.8 C) (10/26 0509) Pulse Rate:  [76-102] 102  (10/26 0509) Resp:  [14-18] 18  (10/26 0509) BP: (133-159)/(81-99) 152/88 mmHg (10/26 0923) SpO2:  [94 %-97 %] 94 % (10/26 0509) Weight:  [177 lb 1.6 oz (80.332 kg)] 177 lb 1.6 oz (80.332 kg) (10/26 0509)  Intake/Output from previous day: 10/25 0701 - 10/26 0700 In: 530 [P.O.:480; I.V.:50] Out: 3125 [Urine:3125] Intake/Output from this shift: Total I/O In: 240 [P.O.:240] Out: -   Physical Exam: Well appearing NAD HEENT: Unremarkable Neck:  No JVD, no thyromegally Lungs:  Clear with no wheezes, rales, or rhonchi HEART:  Regular rate rhythm, no murmurs, no rubs, no clicks Abd:  Flat, positive bowel sounds, no organomegally, no rebound, no guarding Ext:  2 plus pulses, no edema, no cyanosis, no clubbing Skin:  No rashes no nodules Neuro:  CN II through XII intact, motor grossly intact  Lab Results:  [facility] 12/04/11 2205  WBC 9.6  HGB 10.8*  PLT 229    Basename 12/07/11 0540 12/06/11 1230  NA 138 136  K 4.1 4.2  CL 100 99  CO2 27 28  GLUCOSE 102* 104*  BUN 23 25*  CREATININE 1.55* 1.60*    Basename 12/05/11 0930 12/05/11 0430  TROPONINI <0.30 <0.30   Hepatic Function Panel No results found for this basename: PROT,ALBUMIN,AST,ALT,ALKPHOS,BILITOT,BILIDIR,IBILI in the last 72 hours No results found for this basename: CHOL in the last 72 hours No results found for this basename: PROTIME in the last 72 hours  Imaging: No results found.  Cardiac Studies: Tele - atrial fib with a CVR Assessment/Plan:  1. Acute on chronic systolic/diastolic CHF - he appears euvolemic and has lost over 15 lbs. Ok for discharge on current medical therapy. Will give lasix 60 mg twice daily at discharge. Continue  other medical therapy. Early followup in 1-2 weeks to assess volume. Will need BMP in a week.  LOS: 3 days    Lewayne Bunting 12/07/2011, 10:25 AM

## 2011-12-07 NOTE — Progress Notes (Signed)
ANTICOAGULATION CONSULT NOTE - Follow-Up Consult  Pharmacy Consult for Coumadin Indication: atrial fibrillation; small thrombus in LA appendage  Allergies  Allergen Reactions  . Ace Inhibitors Other (See Comments)    Unknown reaction  . Clonidine Derivatives Other (See Comments)    Unknown reaction    Labs:  Basename 12/07/11 0540 12/06/11 1230 12/06/11 0253 12/05/11 0930 12/05/11 0430 12/04/11 2205  HGB -- -- -- -- -- 10.8*  HCT -- -- -- -- -- 33.9*  PLT -- -- -- -- -- 229  APTT -- -- -- -- -- --  LABPROT 23.6* -- 29.2* -- 30.9* --  INR 2.21* -- 2.95* -- 3.19* --  HEPARINUNFRC -- -- -- -- -- --  CREATININE 1.55* 1.60* 1.66* -- -- --  CKTOTAL -- -- -- -- -- --  CKMB -- -- -- -- -- --  TROPONINI -- -- -- <0.30 <0.30 <0.30    Estimated Creatinine Clearance: 47.8 ml/min (by C-G formula based on Cr of 1.55).  Assessment: 16 YOM admitted on coumadin PTA for new onset afib. Plans for cardioversion today - unfortunately TEE showed small thombus in LA appendage. Unable to cardiovert. INR today is 2.21-therapeutic and trending down. Home regimen was 5mg  daily except 7.5mg  on Wednesday. Noted pt now started on new amio which will increase effect of coumadin  Goal of Therapy:  INR 2-3   Plan:  1. Coumadin 7.5 mg tonight then recommend resuming home regimen 2. Follow-up daily PT/INR.   Okey Regal, PharmD [phone]  12/07/2011,11:14 AM

## 2011-12-07 NOTE — Discharge Instructions (Signed)
Heart Failure  Heart failure (HF) means your heart has trouble pumping blood. The blood is not circulated very well in your body because your heart is weak. HF may cause blood to back up into your lungs. This is commonly called "fluid in the lungs." HF may also cause your ankles and legs to puff up (swell). It is important to take good care of yourself when you have HF.   HOME CARE  Medicine  Take your medicine as told by your doctor.  Do not stop taking your medicine unless told to by your doctor.  Be sure to get your medicine refilled before it runs out.  Do not skip any doses of medicine.  Tell your doctor if you cannot afford your medicine.  Keep a list of all the medicine you take. This should include the name, how much you take, and when you take it.  Ask your doctor if you have any questions about your medicine. Do not take over-the-counter medicine unless your doctor says it is okay.  What you eat  Do not drink alcohol unless your doctor says it is okay.  Avoid food that is high in fat. Avoid foods fried in oil or made with fat.  Eat a healthy diet. A dietitian can help you with healthy food choices.  Limit how much salt you eat. Do not eat more than 1500 milligrams (mg) of salt (sodium) a day.  Do not add salt to your food.  Do not eat food made with a lot of salt. Here are some examples:  Canned vegetables.  Canned soups.  Canned drinks.  Hot dogs.  Fast food.  Pizza.  Chips.  Check your weight  Weigh yourself every morning. You should do this after you pee (urinate) and before you eat breakfast.  Wear the same amount of clothes each time you weigh yourself.  Write down your weight every day. Tell your doctor if you gain 3 lb/1.4 kg or more in 1 day or 5 lb/2.3 kg in a week.  Blood pressure monitoring  Buy a home blood pressure cuff.  Check your blood pressure as told by your doctor. Write down your blood pressure numbers on a sheet of paper.  Bring your blood pressure  numbers to your doctor visits.  Smoking  Smoking is bad for your heart.  Ask your doctor how to stop smoking.  Exercise  Talk to your doctor about exercise.  Ask how much exercise is right for you.  Exercise as much as you can. Stop if you feel tired, have problems breathing, or have chest pain.  Keep all your doctor appointments.  GET HELP RIGHT AWAY IF:  You have trouble breathing.  You have a cough that does not go away.  You cannot sleep because you have trouble breathing.  You gain 3 lb/1.4 kg or more in 1 day or 5 lb/2.3 kg in a week.  You have puffy ankles or legs.  You have an enlarged (bloated) belly (abdomen).  You pass out (faint).  You have really bad chest pain or pressure. This includes pain or pressure in your:  Arms.  Jaw.  Neck.  Back.  If you have any of the above problems, call your local emergency services (911 in [place] Do not drive yourself to the hospital.  MAKE SURE YOU:  Understand these instructions.  Will watch your condition.  Will get help right away if you are not doing well or get worse. 

## 2011-12-09 ENCOUNTER — Telehealth: Payer: Self-pay | Admitting: Cardiology

## 2011-12-09 ENCOUNTER — Encounter (HOSPITAL_COMMUNITY): Payer: Self-pay | Admitting: Internal Medicine

## 2011-12-09 MED ORDER — POTASSIUM CHLORIDE CRYS ER 20 MEQ PO TBCR: 40.0000 meq | EXTENDED_RELEASE_TABLET | Freq: Two times a day (BID) | ORAL | Status: DC

## 2011-12-09 NOTE — Telephone Encounter (Signed)
Pt's dtr would like a rtn call re what pt's medication's should be at this point, she is a Teacher, early years/pre

## 2011-12-09 NOTE — Telephone Encounter (Signed)
Please ask for Selena Batten it is regarding her father. Pt is confused about a few medications.

## 2011-12-09 NOTE — Telephone Encounter (Signed)
Tried to call pt, no answer. CTuck

## 2011-12-09 NOTE — Telephone Encounter (Signed)
Spoke to patient's daughter Selena Batten who is a Teacher, early years/pre at [employer].She wanted to make sure patient was taking enough coreg since he use to take labetalol 600 mg. States patient was discharged on coreg 25 mg twice a day.Selena Batten was told Dr.Jordan out of office this week will check with Norma Fredrickson NP tomorrow 12/10/11 and call her back. She also said her father called the office 2 weeks ago and never got a return call.Selena Batten was told I never received that phone call 2 weeks ago and if this happens again please call me back.Will let my nurse manager know about patient calling and never receiving a call back.

## 2011-12-10 NOTE — Telephone Encounter (Signed)
Spoke to [place] at [facility] she stated patient refused home care services.Will let Dr.Jordan know.

## 2011-12-10 NOTE — Telephone Encounter (Signed)
Spoke to patient's daughter Selena Batten was told spoke with Norma Fredrickson NP about coreg dose she advised to continue as prescribed.Will verify with Dr.Jordan next week.Advised to keep appointment with Dr.Jordan 12/27/11.

## 2011-12-10 NOTE — Telephone Encounter (Signed)
New problem:  patient refuse home care services Over the weekend.  Also re- attempts to do home care services.

## 2011-12-11 ENCOUNTER — Ambulatory Visit (INDEPENDENT_AMBULATORY_CARE_PROVIDER_SITE_OTHER): Payer: Medicare Other | Admitting: *Deleted

## 2011-12-11 DIAGNOSIS — I4891 Unspecified atrial fibrillation: Secondary | ICD-10-CM

## 2011-12-11 DIAGNOSIS — Z7901 Long term (current) use of anticoagulants: Secondary | ICD-10-CM

## 2011-12-11 DIAGNOSIS — I5189 Other ill-defined heart diseases: Secondary | ICD-10-CM

## 2011-12-11 DIAGNOSIS — I513 Intracardiac thrombosis, not elsewhere classified: Secondary | ICD-10-CM | POA: Insufficient documentation

## 2011-12-18 ENCOUNTER — Ambulatory Visit (INDEPENDENT_AMBULATORY_CARE_PROVIDER_SITE_OTHER): Payer: Medicare Other | Admitting: *Deleted

## 2011-12-18 DIAGNOSIS — Z7901 Long term (current) use of anticoagulants: Secondary | ICD-10-CM

## 2011-12-18 DIAGNOSIS — I513 Intracardiac thrombosis, not elsewhere classified: Secondary | ICD-10-CM

## 2011-12-18 DIAGNOSIS — I5189 Other ill-defined heart diseases: Secondary | ICD-10-CM

## 2011-12-18 DIAGNOSIS — I4891 Unspecified atrial fibrillation: Secondary | ICD-10-CM

## 2011-12-27 ENCOUNTER — Ambulatory Visit (INDEPENDENT_AMBULATORY_CARE_PROVIDER_SITE_OTHER): Payer: Medicare Other | Admitting: *Deleted

## 2011-12-27 ENCOUNTER — Encounter: Payer: Self-pay | Admitting: Cardiology

## 2011-12-27 ENCOUNTER — Ambulatory Visit (INDEPENDENT_AMBULATORY_CARE_PROVIDER_SITE_OTHER): Payer: Medicare Other | Admitting: Cardiology

## 2011-12-27 VITALS — BP 140/92 | HR 72 | Ht 70.0 in | Wt 180.0 lb

## 2011-12-27 DIAGNOSIS — I5043 Acute on chronic combined systolic (congestive) and diastolic (congestive) heart failure: Secondary | ICD-10-CM

## 2011-12-27 DIAGNOSIS — I513 Intracardiac thrombosis, not elsewhere classified: Secondary | ICD-10-CM

## 2011-12-27 DIAGNOSIS — Z7901 Long term (current) use of anticoagulants: Secondary | ICD-10-CM

## 2011-12-27 DIAGNOSIS — N183 Chronic kidney disease, stage 3 unspecified: Secondary | ICD-10-CM

## 2011-12-27 DIAGNOSIS — I10 Essential (primary) hypertension: Secondary | ICD-10-CM

## 2011-12-27 DIAGNOSIS — I2510 Atherosclerotic heart disease of native coronary artery without angina pectoris: Secondary | ICD-10-CM

## 2011-12-27 DIAGNOSIS — I4891 Unspecified atrial fibrillation: Secondary | ICD-10-CM

## 2011-12-27 DIAGNOSIS — I5040 Unspecified combined systolic (congestive) and diastolic (congestive) heart failure: Secondary | ICD-10-CM

## 2011-12-27 LAB — POCT INR: INR: 3.2

## 2011-12-27 MED ORDER — FUROSEMIDE 40 MG PO TABS: 80.0000 mg | ORAL_TABLET | Freq: Two times a day (BID) | ORAL | Status: DC

## 2011-12-27 NOTE — Progress Notes (Signed)
Erik Adams Date of Birth: 10-07-1945 Medical Record #[id number]  History of Present Illness: Erik Adams is seen today for a followup visit. He was admitted in mid October with a CHF exacerbation. He was diuresed with IV Lasix. He was started on amiodarone for his atrial fibrillation with plans to perform cardioversion. He had a TEE which showed spontaneous echo contrast in the left atrium and a question of small thrombus in the appendage. He was not cardioverted. He has been maintained on Coumadin and INRs have been therapeutic. In the hospital he was diuresed 15 pounds. Since discharge he has gained 3 pounds. He has had some orthopnea and PND at night. He notes some increased swelling in his ankles right greater than left. He denies any chest pain or dizziness.  Current Outpatient Prescriptions on File Prior to Visit  Medication Sig Dispense Refill  . amiodarone (PACERONE) 400 MG tablet Take 1 tablet (400 mg total) by mouth daily.  30 tablet  6  . amLODipine (NORVASC) 10 MG tablet Take 10 mg by mouth daily.      Marland Kitchen aspirin EC 81 MG tablet Take 1 tablet (81 mg total) by mouth daily.  90 tablet  3  . carvedilol (COREG) 25 MG tablet Take 1 tablet (25 mg total) by mouth 2 (two) times daily with a meal.  60 tablet  6  . ferrous sulfate 325 (65 FE) MG tablet Take 1 tablet (325 mg total) by mouth daily with breakfast.  30 tablet  1  . hydrALAZINE (APRESOLINE) 50 MG tablet Take 1.5 tablets (75 mg total) by mouth 3 (three) times daily.  180 tablet  6  . nitroGLYCERIN (NITROSTAT) 0.4 MG SL tablet Place 0.4 mg under the tongue every 5 (five) minutes as needed. For chest pain      . olmesartan (BENICAR) 40 MG tablet Take 40 mg by mouth daily.       . potassium chloride SA (K-DUR,KLOR-CON) 20 MEQ tablet Take 2 tablets (40 mEq total) by mouth 2 (two) times daily.  180 tablet  6  . rosuvastatin (CRESTOR) 10 MG tablet Take 10 mg by mouth daily.       Marland Kitchen warfarin (COUMADIN) 5 MG tablet Take 5 mg by mouth  See admin instructions. Take 1 1/2 tablet (7.5 mg) on Wednesday and take 1 tablet (5 mg) on all other days      . [DISCONTINUED] furosemide (LASIX) 40 MG tablet Take 1.5 tablets (60 mg total) by mouth 2 (two) times daily.  120 tablet  6    Allergies  Allergen Reactions  . Ace Inhibitors Other (See Comments)    Unknown reaction  . Clonidine Derivatives Other (See Comments)    Unknown reaction    Past Medical History  Diagnosis Date  . CAD (coronary artery disease)     a. s/p BMS to mLAD 2000;  b. LHC 3/12:  sig ISR mLAD, RI 80-90%, Dx1 90% (too small for PCI);  PCI:  ION DES to mLAD, ION DES to RI  . HTN (hypertension)   . Hypercholesteremia   . CVA (cerebral infarction)   . CRI (chronic renal insufficiency)     Baseline creatinine of 1.4 to 1.7  . 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 EF is 45 to 50%;  b. echo 10/13:  inf HK, mod LVH, EF 20%, mild AI, mod MR, mod TR, mod BAE, PASP 46  . Atrial fibrillation 11/13/2011    coumadin  and amiodarone  . Left atrial thrombus     TEE 11/2011  . Iron deficiency anemia     Past Surgical History  Procedure Date  . Coronary angioplasty with stent placement 2000    BMS to the LAD  . Mastoidectomy   . Coronary angioplasty with stent placement March 2012    DES to LAD and Ramus intermdius  . Tee without cardioversion 12/06/2011    Procedure: TRANSESOPHAGEAL ECHOCARDIOGRAM (TEE);  Surgeon: Pricilla Riffle, MD;  Location: [facility] ENDOSCOPY;  Service: Cardiovascular;  Laterality: N/A;    History  Smoking status  . Former Smoker  . Quit date: 02/11/1974  Smokeless tobacco  . Never Used    History  Alcohol Use No    Family History  Problem Relation Age of Onset  . Heart disease Father     Review of Systems: The review of systems is per the HPI.  All other systems were reviewed and are negative.  Physical Exam: BP 140/92  Pulse 72  Ht 5\' 10"  (1.778 m)  Wt 81.647 kg (180 lb)  BMI 25.83 kg/m2 Patient is very  pleasant and in no acute distress. Skin is warm and dry. Color is normal.  HEENT is unremarkable. Normocephalic/atraumatic. PERRL. Sclera are nonicteric. Neck is supple. No masses. Positive JVD to 6 cm. Lungs reveal mild basilar rales. Cardiac exam shows an irregular rate and rhythm.  Abdomen is soft. Extremities show 1-2+ ankle edema. Gait and ROM are intact. No gross neurologic deficits noted.   LABORATORY DATA:    Assessment / Plan: 1. Atrial fibrillation, new onset. Duration is unknown. Development of atrial fibrillation has significantly worsened his congestive heart failure. Currently on amiodarone at 400 mg daily. Therapeutic on Coumadin. We will plan on outpatient DC cardioversion on November 26 which will be a full month from his TEE with therapeutic anticoagulation. We'll have him return on November 25 for preprocedure lab work. This will include CBC, chemistry, thyroid studies, and ECG.  2. Coronary disease status post stenting of the LAD and intermediate branches with a drug-eluting stents in March of 2012. He is asymptomatic. Continue aspirin 81 mg daily.  3. Hypertension blood pressure is under fairly good control.  3. Congestive heart failure acute on chronic systolic failure. Ejection fraction of 20%. We'll plan on optimizing his medical therapy. I recommended increasing his Lasix to 80 mg twice a day. He is on an ARB and carvedilol. If despite optimal medical therapy and conversion to sinus rhythm his ejection fraction remains low we will need to consider him for an ICD.  4. History of CVA.  5. Chronic kidney disease stage III.

## 2011-12-27 NOTE — Patient Instructions (Signed)
Increase Lasix to 80 mg twice a day.  We will have you come in for blood work and Ecg on Monday Nov 25 and schedule you for cardioversion on Nov 26.  Electrical Cardioversion Cardioversion is the delivery of a jolt of electricity to change the rhythm of the heart. Sticky patches or metal paddles are placed on the chest to deliver the electricity from a special device. This is done to restore a normal rhythm. A rhythm that is too fast or not regular keeps the heart from pumping well. Compared to medicines used to change an abnormal rhythm, cardioversion is faster and works better. It is also unpleasant and may dislodge blood clots from the heart. WHEN WOULD THIS BE DONE?  In an emergency:  There is low or no blood pressure as a result of the heart rhythm.  Normal rhythm must be restored as fast as possible to protect the brain and heart from further damage.  It may save a life.  For less serious heart rhythms, such as atrial fibrillation or flutter, in which:  The heart is beating too fast or is not regular.  The heart is still able to pump enough blood, but not as well as it should.  Medicine to change the rhythm has not worked.  It is safe to wait in order to allow time for preparation. LET YOUR CAREGIVER KNOW ABOUT:   Every medicine you are taking. It is very important to do this! Know when to take or stop taking any of them.  Any time in the past that you have felt your heart was not beating normally. RISKS AND COMPLICATIONS   Clots may form in the chambers of the heart if it is beating too fast. These clots may be dislodged during the procedure and travel to other parts of the body.  There is risk of a stroke during and after the procedure if a clot moves. Blood thinners lower this risk.  You may have a special test of your heart (TEE) to make sure there are no clots in your heart. BEFORE THE PROCEDURE   You may have some tests to see how well your heart is working.  You  may start taking blood thinners so your blood does not clot as easily.  Other drugs may be given to help your heart work better. PROCEDURE (SCHEDULED)  The procedure is typically done in a hospital by a heart doctor (cardiologist).  You will be told when and where to go.  You may be given some medicine through an intravenous (IV) access to reduce discomfort and make you sleepy before the procedure.  Your whole body may move when the shock is delivered. Your chest may feel sore.  You may be able to go home after a few hours. Your heart rhythm will be watched to make sure it does not change. HOME CARE INSTRUCTIONS   Only take medicine as directed by your caregiver. Be sure you understand how and when to take your medicine.  Learn how to feel your pulse and check it often.  Limit your activity for 48 hours.  Avoid caffeine and other stimulants as directed. SEEK MEDICAL CARE IF:   You feel like your heart is beating too fast or your pulse is not regular.  You have any questions about your medicines.  You have bleeding that will not stop. SEEK IMMEDIATE MEDICAL CARE IF:   You are dizzy or feel faint.  It is hard to breathe or you feel short  of breath.  There is a change in discomfort in your chest.  Your speech is slurred or you have trouble moving your arm or leg on one side.  You get a muscle cramp.  Your fingers or toes turn cold or blue. MAKE SURE YOU:   Understand these instructions.  Will watch your condition.  Will get help right away if you are not doing well or get worse. Document Released: 01/18/2002 Document Revised: 04/22/2011 Document Reviewed: 05/20/2007 [facility] Patient Information 2013 [place], [place].

## 2012-01-01 ENCOUNTER — Other Ambulatory Visit: Payer: Self-pay

## 2012-01-01 ENCOUNTER — Other Ambulatory Visit: Payer: Self-pay | Admitting: Cardiology

## 2012-01-01 DIAGNOSIS — I4891 Unspecified atrial fibrillation: Secondary | ICD-10-CM

## 2012-01-02 ENCOUNTER — Other Ambulatory Visit: Payer: Medicare Other

## 2012-01-03 ENCOUNTER — Ambulatory Visit (INDEPENDENT_AMBULATORY_CARE_PROVIDER_SITE_OTHER): Payer: Medicare Other | Admitting: *Deleted

## 2012-01-03 ENCOUNTER — Ambulatory Visit (INDEPENDENT_AMBULATORY_CARE_PROVIDER_SITE_OTHER): Payer: Medicare Other

## 2012-01-03 DIAGNOSIS — R0989 Other specified symptoms and signs involving the circulatory and respiratory systems: Secondary | ICD-10-CM

## 2012-01-03 DIAGNOSIS — I4891 Unspecified atrial fibrillation: Secondary | ICD-10-CM

## 2012-01-03 DIAGNOSIS — I5189 Other ill-defined heart diseases: Secondary | ICD-10-CM

## 2012-01-03 DIAGNOSIS — I513 Intracardiac thrombosis, not elsewhere classified: Secondary | ICD-10-CM

## 2012-01-03 DIAGNOSIS — Z7901 Long term (current) use of anticoagulants: Secondary | ICD-10-CM

## 2012-01-03 LAB — BASIC METABOLIC PANEL
BUN: 36 mg/dL — ABNORMAL HIGH (ref 6–23)
CO2: 30 mEq/L (ref 19–32)
Calcium: 8.8 mg/dL (ref 8.4–10.5)
Chloride: 103 mEq/L (ref 96–112)
Creat: 1.85 mg/dL — ABNORMAL HIGH (ref 0.50–1.35)
Glucose, Bld: 95 mg/dL (ref 70–99)
Potassium: 3.7 mEq/L (ref 3.5–5.3)
Sodium: 141 mEq/L (ref 135–145)

## 2012-01-03 LAB — PROTIME-INR
INR: 2.1 — ABNORMAL HIGH (ref <1.50)
Prothrombin Time: 22.9 seconds — ABNORMAL HIGH (ref 11.6–15.2)

## 2012-01-03 LAB — CBC WITH DIFFERENTIAL/PLATELET
Basophils Relative: 1 % (ref 0–1)
Eosinophils Absolute: 0.2 10*3/uL (ref 0.0–0.7)
Eosinophils Relative: 3 % (ref 0–5)
HCT: 33.4 % — ABNORMAL LOW (ref 39.0–52.0)
Hemoglobin: 10.7 g/dL — ABNORMAL LOW (ref 13.0–17.0)
MCH: 26.2 pg (ref 26.0–34.0)
MCHC: 32 g/dL (ref 30.0–36.0)
Monocytes Absolute: 0.9 10*3/uL (ref 0.1–1.0)
Monocytes Relative: 12 % (ref 3–12)

## 2012-01-06 ENCOUNTER — Encounter (HOSPITAL_COMMUNITY): Payer: Self-pay | Admitting: Anesthesiology

## 2012-01-06 ENCOUNTER — Ambulatory Visit: Payer: Medicare Other

## 2012-01-06 ENCOUNTER — Ambulatory Visit (HOSPITAL_COMMUNITY)
Admission: RE | Admit: 2012-01-06 | Discharge: 2012-01-06 | Disposition: A | Discharge status: Home / Self Care | Payer: Medicare Other | Source: Ambulatory Visit | Attending: Cardiovascular Disease | Admitting: Cardiovascular Disease

## 2012-01-06 ENCOUNTER — Encounter (HOSPITAL_COMMUNITY)
Admission: RE | Disposition: A | Discharge status: Home / Self Care | Payer: Self-pay | Source: Ambulatory Visit | Attending: Cardiovascular Disease

## 2012-01-06 ENCOUNTER — Ambulatory Visit (HOSPITAL_COMMUNITY): Payer: Medicare Other | Admitting: Anesthesiology

## 2012-01-06 ENCOUNTER — Encounter (HOSPITAL_COMMUNITY): Payer: Self-pay | Admitting: Gastroenterology

## 2012-01-06 ENCOUNTER — Telehealth: Payer: Self-pay

## 2012-01-06 ENCOUNTER — Other Ambulatory Visit: Payer: Medicare Other

## 2012-01-06 DIAGNOSIS — I4891 Unspecified atrial fibrillation: Secondary | ICD-10-CM | POA: Insufficient documentation

## 2012-01-06 DIAGNOSIS — I2510 Atherosclerotic heart disease of native coronary artery without angina pectoris: Secondary | ICD-10-CM | POA: Insufficient documentation

## 2012-01-06 DIAGNOSIS — Z7901 Long term (current) use of anticoagulants: Secondary | ICD-10-CM | POA: Insufficient documentation

## 2012-01-06 DIAGNOSIS — Z01810 Encounter for preprocedural cardiovascular examination: Secondary | ICD-10-CM | POA: Insufficient documentation

## 2012-01-06 DIAGNOSIS — I129 Hypertensive chronic kidney disease with stage 1 through stage 4 chronic kidney disease, or unspecified chronic kidney disease: Secondary | ICD-10-CM | POA: Insufficient documentation

## 2012-01-06 DIAGNOSIS — I5023 Acute on chronic systolic (congestive) heart failure: Secondary | ICD-10-CM | POA: Insufficient documentation

## 2012-01-06 DIAGNOSIS — Z9861 Coronary angioplasty status: Secondary | ICD-10-CM | POA: Insufficient documentation

## 2012-01-06 DIAGNOSIS — N183 Chronic kidney disease, stage 3 unspecified: Secondary | ICD-10-CM | POA: Insufficient documentation

## 2012-01-06 DIAGNOSIS — Z8673 Personal history of transient ischemic attack (TIA), and cerebral infarction without residual deficits: Secondary | ICD-10-CM | POA: Insufficient documentation

## 2012-01-06 DIAGNOSIS — I509 Heart failure, unspecified: Secondary | ICD-10-CM | POA: Insufficient documentation

## 2012-01-06 HISTORY — PX: CARDIOVERSION: SHX1299

## 2012-01-06 SURGERY — CARDIOVERSION
Anesthesia: General

## 2012-01-06 MED ORDER — PROPOFOL 10 MG/ML IV BOLUS
INTRAVENOUS | Status: DC | PRN
  Administered 2012-01-06 (×2): 50 mg via INTRAVENOUS
  Administered 2012-01-06: 30 mg via INTRAVENOUS

## 2012-01-06 MED ORDER — SODIUM CHLORIDE 0.9 % IV SOLN
INTRAVENOUS | Status: DC
  Administered 2012-01-06: 12:00:00 via INTRAVENOUS

## 2012-01-06 NOTE — Transfer of Care (Signed)
Immediate Anesthesia Transfer of Care Note  Patient: Erik Adams  Procedure(s) Performed: Procedure(s) (LRB) with comments: CARDIOVERSION (N/A) - call anesthesia  maybe able to do cardio. early at 12:00  Patient Location: Endoscopy Unit  Anesthesia Type:General  Level of Consciousness: awake, alert  and oriented  Airway & Oxygen Therapy: Patient Spontanous Breathing and Patient connected to nasal cannula oxygen  Post-op Assessment: Report given to PACU RN, Post -op Vital signs reviewed and stable and Patient moving all extremities X 4  Post vital signs: Reviewed and stable  Complications: No apparent anesthesia complications

## 2012-01-06 NOTE — Anesthesia Postprocedure Evaluation (Signed)
  Anesthesia Post-op Note  Patient: Erik Adams  Procedure(s) Performed: Procedure(s) (LRB) with comments: CARDIOVERSION (N/A) - call anesthesia  maybe able to do cardio. early at 12:00  Patient Location: Endoscopy Unit  Anesthesia Type:General  Level of Consciousness: awake, alert  and oriented  Airway and Oxygen Therapy: Patient Spontanous Breathing  Post-op Pain: none  Post-op Assessment: Post-op Vital signs reviewed, Patient's Cardiovascular Status Stable, Respiratory Function Stable, Patent Airway, No signs of Nausea or vomiting, Adequate PO intake, Pain level controlled, No headache, No backache, No residual numbness and No residual motor weakness  Post-op Vital Signs: Reviewed and stable  Complications: No apparent anesthesia complications

## 2012-01-06 NOTE — Interval H&P Note (Signed)
History and Physical Interval Note:  01/06/2012 11:46 AM  Erik Adams  has presented today for surgery, with the diagnosis of atril-fib  The various methods of treatment have been discussed with the patient and family. After consideration of risks, benefits and other options for treatment, the patient has consented to  Procedure(s) (LRB) with comments: CARDIOVERSION (N/A) - call anesthesia  maybe able to do cardio. early at 12:00 as a surgical intervention .  The patient's history has been reviewed, patient examined, no change in status, stable for surgery.  I have reviewed the patient's chart and labs.  Questions were answered to the patient's satisfaction.     Charlton Haws  Discussed with Dr Swaziland Has been Rx anticoagulated since TEE more than 4 weeks ago.

## 2012-01-06 NOTE — Telephone Encounter (Signed)
Patient called post hospital appointment scheduled with Norma Fredrickson NP 01/20/12 at 3:00 pm.Advised to keep INR appointment in 1 week 01/17/12.

## 2012-01-06 NOTE — CV Procedure (Signed)
DCC:  Rx anticoagulation for 4 weeks with coumadin Rhythm before shock Afib rate 80's 130 mg Propofol by Dr Noreene Larsson Shock x2 150 J then 200J biphasic  Converted to NSR rartes in high 60"  Impression Successful DCC with restoration of NSR  Charlton Haws

## 2012-01-06 NOTE — H&P (View-Only) (Signed)
  Erik Adams Date of Birth: 02/02/1946 Medical Record #[id number]  History of Present Illness: Erik Adams is seen today for a followup visit. He was admitted in mid October with a CHF exacerbation. He was diuresed with IV Lasix. He was started on amiodarone for his atrial fibrillation with plans to perform cardioversion. He had a TEE which showed spontaneous echo contrast in the left atrium and a question of small thrombus in the appendage. He was not cardioverted. He has been maintained on Coumadin and INRs have been therapeutic. In the hospital he was diuresed 15 pounds. Since discharge he has gained 3 pounds. He has had some orthopnea and PND at night. He notes some increased swelling in his ankles right greater than left. He denies any chest pain or dizziness.  Current Outpatient Prescriptions on File Prior to Visit  Medication Sig Dispense Refill  . amiodarone (PACERONE) 400 MG tablet Take 1 tablet (400 mg total) by mouth daily.  30 tablet  6  . amLODipine (NORVASC) 10 MG tablet Take 10 mg by mouth daily.      . aspirin EC 81 MG tablet Take 1 tablet (81 mg total) by mouth daily.  90 tablet  3  . carvedilol (COREG) 25 MG tablet Take 1 tablet (25 mg total) by mouth 2 (two) times daily with a meal.  60 tablet  6  . ferrous sulfate 325 (65 FE) MG tablet Take 1 tablet (325 mg total) by mouth daily with breakfast.  30 tablet  1  . hydrALAZINE (APRESOLINE) 50 MG tablet Take 1.5 tablets (75 mg total) by mouth 3 (three) times daily.  180 tablet  6  . nitroGLYCERIN (NITROSTAT) 0.4 MG SL tablet Place 0.4 mg under the tongue every 5 (five) minutes as needed. For chest pain      . olmesartan (BENICAR) 40 MG tablet Take 40 mg by mouth daily.       . potassium chloride SA (K-DUR,KLOR-CON) 20 MEQ tablet Take 2 tablets (40 mEq total) by mouth 2 (two) times daily.  180 tablet  6  . rosuvastatin (CRESTOR) 10 MG tablet Take 10 mg by mouth daily.       . warfarin (COUMADIN) 5 MG tablet Take 5 mg by mouth  See admin instructions. Take 1 1/2 tablet (7.5 mg) on Wednesday and take 1 tablet (5 mg) on all other days      . [DISCONTINUED] furosemide (LASIX) 40 MG tablet Take 1.5 tablets (60 mg total) by mouth 2 (two) times daily.  120 tablet  6    Allergies  Allergen Reactions  . Ace Inhibitors Other (See Comments)    Unknown reaction  . Clonidine Derivatives Other (See Comments)    Unknown reaction    Past Medical History  Diagnosis Date  . CAD (coronary artery disease)     a. s/p BMS to mLAD 2000;  b. LHC 3/12:  sig ISR mLAD, RI 80-90%, Dx1 90% (too small for PCI);  PCI:  ION DES to mLAD, ION DES to RI  . HTN (hypertension)   . Hypercholesteremia   . CVA (cerebral infarction)   . CRI (chronic renal insufficiency)     Baseline creatinine of 1.4 to 1.7  . 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 EF is 45 to 50%;  b. echo 10/13:  inf HK, mod LVH, EF 20%, mild AI, mod MR, mod TR, mod BAE, PASP 46  . Atrial fibrillation 11/13/2011    coumadin   and amiodarone  . Left atrial thrombus     TEE 11/2011  . Iron deficiency anemia     Past Surgical History  Procedure Date  . Coronary angioplasty with stent placement 2000    BMS to the LAD  . Mastoidectomy   . Coronary angioplasty with stent placement March 2012    DES to LAD and Ramus intermdius  . Tee without cardioversion 12/06/2011    Procedure: TRANSESOPHAGEAL ECHOCARDIOGRAM (TEE);  Surgeon: Paula V Ross, MD;  Location: [facility] ENDOSCOPY;  Service: Cardiovascular;  Laterality: N/A;    History  Smoking status  . Former Smoker  . Quit date: 02/11/1974  Smokeless tobacco  . Never Used    History  Alcohol Use No    Family History  Problem Relation Age of Onset  . Heart disease Father     Review of Systems: The review of systems is per the HPI.  All other systems were reviewed and are negative.  Physical Exam: BP 140/92  Pulse 72  Ht 5' 10" (1.778 m)  Wt 81.647 kg (180 lb)  BMI 25.83 kg/m2 Patient is very  pleasant and in no acute distress. Skin is warm and dry. Color is normal.  HEENT is unremarkable. Normocephalic/atraumatic. PERRL. Sclera are nonicteric. Neck is supple. No masses. Positive JVD to 6 cm. Lungs reveal mild basilar rales. Cardiac exam shows an irregular rate and rhythm.  Abdomen is soft. Extremities show 1-2+ ankle edema. Gait and ROM are intact. No gross neurologic deficits noted.   LABORATORY DATA:    Assessment / Plan: 1. Atrial fibrillation, new onset. Duration is unknown. Development of atrial fibrillation has significantly worsened his congestive heart failure. Currently on amiodarone at 400 mg daily. Therapeutic on Coumadin. We will plan on outpatient DC cardioversion on November 26 which will be a full month from his TEE with therapeutic anticoagulation. We'll have him return on November 25 for preprocedure lab work. This will include CBC, chemistry, thyroid studies, and ECG.  2. Coronary disease status post stenting of the LAD and intermediate branches with a drug-eluting stents in March of 2012. He is asymptomatic. Continue aspirin 81 mg daily.  3. Hypertension blood pressure is under fairly good control.  3. Congestive heart failure acute on chronic systolic failure. Ejection fraction of 20%. We'll plan on optimizing his medical therapy. I recommended increasing his Lasix to 80 mg twice a day. He is on an ARB and carvedilol. If despite optimal medical therapy and conversion to sinus rhythm his ejection fraction remains low we will need to consider him for an ICD.  4. History of CVA.  5. Chronic kidney disease stage III. 

## 2012-01-06 NOTE — Preoperative (Signed)
Beta Blockers   Reason not to administer Beta Blockers:Not Applicable 

## 2012-01-06 NOTE — Anesthesia Preprocedure Evaluation (Addendum)
Anesthesia Evaluation  Patient identified by MRN, date of birth, ID band Patient awake    Reviewed: Allergy & Precautions, H&P , NPO status , Patient's Chart, lab work & pertinent test results, reviewed documented beta blocker date and time   History of Anesthesia Complications Negative for: history of anesthetic complications  Airway Mallampati: II TM Distance: >3 FB Neck ROM: Full    Dental  (+) Teeth Intact and Dental Advisory Given   Pulmonary shortness of breath and with exertion,  breath sounds clear to auscultation        Cardiovascular hypertension, Pt. on medications and Pt. on home beta blockers + CAD and + Cardiac Stents + dysrhythmias Atrial Fibrillation Rhythm:Irregular Rate:Normal     Neuro/Psych CVA, No Residual Symptoms negative psych ROS   GI/Hepatic negative GI ROS, Neg liver ROS,   Endo/Other  negative endocrine ROS  Renal/GU Renal InsufficiencyRenal disease  negative genitourinary   Musculoskeletal negative musculoskeletal ROS (+)   Abdominal   Peds negative pediatric ROS (+)  Hematology negative hematology ROS (+)   Anesthesia Other Findings   Reproductive/Obstetrics negative OB ROS                         Anesthesia Physical Anesthesia Plan  ASA: III  Anesthesia Plan: General   Post-op Pain Management:    Induction: Intravenous  Airway Management Planned: Mask  Additional Equipment:   Intra-op Plan:   Post-operative Plan:   Informed Consent: I have reviewed the patients History and Physical, chart, labs and discussed the procedure including the risks, benefits and alternatives for the proposed anesthesia with the patient or authorized representative who has indicated his/her understanding and acceptance.     Plan Discussed with: CRNA and Surgeon  Anesthesia Plan Comments:         Anesthesia Quick Evaluation

## 2012-01-07 ENCOUNTER — Encounter (HOSPITAL_COMMUNITY): Payer: Self-pay | Admitting: Cardiovascular Disease

## 2012-01-17 ENCOUNTER — Ambulatory Visit (INDEPENDENT_AMBULATORY_CARE_PROVIDER_SITE_OTHER): Payer: Medicare Other | Admitting: *Deleted

## 2012-01-17 DIAGNOSIS — I4891 Unspecified atrial fibrillation: Secondary | ICD-10-CM

## 2012-01-17 DIAGNOSIS — Z7901 Long term (current) use of anticoagulants: Secondary | ICD-10-CM

## 2012-01-17 DIAGNOSIS — I5189 Other ill-defined heart diseases: Secondary | ICD-10-CM

## 2012-01-17 DIAGNOSIS — I513 Intracardiac thrombosis, not elsewhere classified: Secondary | ICD-10-CM

## 2012-01-20 ENCOUNTER — Ambulatory Visit (INDEPENDENT_AMBULATORY_CARE_PROVIDER_SITE_OTHER): Payer: Medicare Other | Admitting: Nurse Practitioner

## 2012-01-20 ENCOUNTER — Encounter: Payer: Self-pay | Admitting: Nurse Practitioner

## 2012-01-20 VITALS — BP 138/84 | HR 76 | Ht 70.0 in | Wt 178.4 lb

## 2012-01-20 DIAGNOSIS — I4891 Unspecified atrial fibrillation: Secondary | ICD-10-CM

## 2012-01-20 NOTE — Progress Notes (Signed)
Erik Adams Date of Birth: 08/17/1945 Medical Record #[id number]  History of Present Illness: Erik Adams is seen back today for a post cardioversion visit. He is seen for Dr. Swaziland. He has most recently had worsening heart failure due to atrial fib. Started on amiodarone therapy. After sufficient time of anticoagulation with coumadin, he underwent successful DCCV. Prior plans for TEE/CV was aborted due to possible thrombus.  His other issues include CAD with prior DES of the LAD and intermediate branches in 2012, HTN, systolic heart failure with an EF of 20%, prior CVA and stage 3 CKD.   He comes in today. He is here alone. He says that he is feeling ok. Has no palpitations. No chest pain. Not short of breath. No PND/orthopnea. Energy is a little off but he says that overall, he is ok. Tolerating his medicines. Not nauseated. No tremors. Has some mild bruising with his coumadin. He notes that when he woke up this morning, he had some swelling in his right hand. No known injury.   Current Outpatient Prescriptions on File Prior to Visit  Medication Sig Dispense Refill  . amiodarone (PACERONE) 400 MG tablet Take 1 tablet (400 mg total) by mouth daily.  30 tablet  6  . amLODipine (NORVASC) 10 MG tablet Take 10 mg by mouth daily.      Marland Kitchen aspirin EC 81 MG tablet Take 1 tablet (81 mg total) by mouth daily.  90 tablet  3  . carvedilol (COREG) 25 MG tablet Take 1 tablet (25 mg total) by mouth 2 (two) times daily with a meal.  60 tablet  6  . ferrous sulfate 325 (65 FE) MG tablet Take 1 tablet (325 mg total) by mouth daily with breakfast.  30 tablet  1  . furosemide (LASIX) 40 MG tablet Take 2 tablets (80 mg total) by mouth 2 (two) times daily.  120 tablet  6  . hydrALAZINE (APRESOLINE) 50 MG tablet Take 1.5 tablets (75 mg total) by mouth 3 (three) times daily.  180 tablet  6  . nitroGLYCERIN (NITROSTAT) 0.4 MG SL tablet Place 0.4 mg under the tongue every 5 (five) minutes as needed. For chest pain       . olmesartan (BENICAR) 40 MG tablet Take 40 mg by mouth daily.       . potassium chloride SA (K-DUR,KLOR-CON) 20 MEQ tablet Take 2 tablets (40 mEq total) by mouth 2 (two) times daily.  180 tablet  6  . rosuvastatin (CRESTOR) 10 MG tablet Take 10 mg by mouth daily.       Marland Kitchen warfarin (COUMADIN) 5 MG tablet Take 5 mg by mouth See admin instructions. Take 1 1/2 tablet (7.5 mg) on Wednesday and take 1 tablet (5 mg) on all other days        Allergies  Allergen Reactions  . Ace Inhibitors Other (See Comments)    Unknown reaction  . Clonidine Derivatives Other (See Comments)    Unknown reaction    Past Medical History  Diagnosis Date  . CAD (coronary artery disease)     a. s/p BMS to mLAD 2000;  b. LHC 3/12:  sig ISR mLAD, RI 80-90%, Dx1 90% (too small for PCI);  PCI:  ION DES to mLAD, ION DES to RI  . HTN (hypertension)   . Hypercholesteremia   . CVA (cerebral infarction)   . CRI (chronic renal insufficiency)     Baseline creatinine of 1.4 to 1.7  . Combined systolic and diastolic heart  failure Sept 2012    a. EF is 45 to 50%;  b. echo 10/13:  inf HK, mod LVH, EF 20%, mild AI, mod MR, mod TR, mod BAE, PASP 46  . Atrial fibrillation 11/13/2011    coumadin and amiodarone; s/p DCCV November 2013  . Left atrial thrombus     TEE 11/2011  . Iron deficiency anemia     Past Surgical History  Procedure Date  . Coronary angioplasty with stent placement 2000    BMS to the LAD  . Mastoidectomy   . Coronary angioplasty with stent placement March 2012    DES to LAD and Ramus intermdius  . Tee without cardioversion 12/06/2011    Procedure: TRANSESOPHAGEAL ECHOCARDIOGRAM (TEE);  Surgeon: Pricilla Riffle, MD;  Location: [facility] ENDOSCOPY;  Service: Cardiovascular;  Laterality: N/A;  . Cardioversion 01/06/2012    Procedure: CARDIOVERSION;  Surgeon: Wendall Stade, MD;  Location: [facility] ENDOSCOPY;  Service: Cardiovascular;  Laterality: N/A;  call anesthesia  maybe able to do cardio. early at 12:00    History    Smoking status  . Former Smoker  . Quit date: 02/11/1974  Smokeless tobacco  . Never Used    History  Alcohol Use No    Family History  Problem Relation Age of Onset  . Heart disease Father     Review of Systems: The review of systems is per the HPI.  All other systems were reviewed and are negative.  Physical Exam: BP 138/84  Pulse 76  Ht 5\' 10"  (1.778 m)  Wt 178 lb 6.4 oz (80.922 kg)  BMI 25.60 kg/m2 Patient is very pleasant and in no acute distress. Skin is warm and dry. Color is normal.  HEENT is unremarkable. Normocephalic/atraumatic. PERRL. Sclera are nonicteric. Neck is supple. No masses. No JVD. Lungs are clear. Cardiac exam shows a regular rate and rhythm. Abdomen is soft. Extremities are without edema. Gait and ROM are intact. No gross neurologic deficits noted.   LABORATORY DATA: EKG today shows that he has reverted back to atrial fib. Rate is controlled. Lateral T wave changes.   Lab Results  Component Value Date   WBC 7.4 01/03/2012   HGB 10.7* 01/03/2012   HCT 33.4* 01/03/2012   PLT 231 01/03/2012   GLUCOSE 95 01/03/2012   CHOL 98 11/13/2011   TRIG 43.0 11/13/2011   HDL 34.50* 11/13/2011   LDLCALC 55 11/13/2011   ALT 21 11/13/2011   AST 27 11/13/2011   NA 141 01/03/2012   K 3.7 01/03/2012   CL 103 01/03/2012   CREATININE 1.85* 01/03/2012   BUN 36* 01/03/2012   CO2 30 01/03/2012   TSH 6.34* 11/13/2011   INR 2.3 01/17/2012    DCC: Rx anticoagulation for 4 weeks with coumadin  Rhythm before shock Afib rate 80's 130 mg Propofol by Dr Noreene Larsson  Shock x2 150 J then 200J biphasic  Converted to NSR rartes in high 60"  Impression Successful DCC with restoration of NSR   Peter Nishan         Assessment / Plan: 1. Atrial fib - loaded with amiodarone - s/p cardioversion - now back in atrial fib. He is asymptomatic. I have left him on his current medicines. Will refer to EP and discuss with Dr. Swaziland tomorrow.  2. Systolic heart failure - EF of 20%. Seems  pretty compensated. He is on a good CHF regimen. Will refer to EP for possible ICD implant.   3. CAD - no active chest pain  4. HTN - blood pressure looks ok.   5. CKD -   6. Coumadin anticoagulation - some mild bruising noted.   7. Right hand swelling - has probably hit his hand. If persists, needs to see PCP.  We will refer to Dr. Jeannetta Nap. I have left him on his current medicines. I will see him back in a month to recheck. Will have Dr. Swaziland review as well.   Patient is agreeable to this plan and will call if any problems develop in the interim.

## 2012-01-20 NOTE — Patient Instructions (Addendum)
You are back out of rhythm  For now, stay on your current medicines  I am going to get you a visit with one of the EP (electrician) doctors here - Dr. Ladona Ridgel or Allred  I will talk to Dr. Swaziland about your situation tomorrow  I will see you back in a month  Call the [facility] office at [phone] if you have any questions, problems or concerns.

## 2012-02-07 ENCOUNTER — Ambulatory Visit (INDEPENDENT_AMBULATORY_CARE_PROVIDER_SITE_OTHER): Payer: Medicare Other | Admitting: *Deleted

## 2012-02-07 DIAGNOSIS — I4891 Unspecified atrial fibrillation: Secondary | ICD-10-CM

## 2012-02-07 DIAGNOSIS — I513 Intracardiac thrombosis, not elsewhere classified: Secondary | ICD-10-CM

## 2012-02-07 DIAGNOSIS — I5189 Other ill-defined heart diseases: Secondary | ICD-10-CM

## 2012-02-07 DIAGNOSIS — Z7901 Long term (current) use of anticoagulants: Secondary | ICD-10-CM

## 2012-02-07 LAB — POCT INR: INR: 4.5

## 2012-02-10 ENCOUNTER — Telehealth: Payer: Self-pay | Admitting: Cardiology

## 2012-02-10 NOTE — Telephone Encounter (Signed)
New problem:   Selena Batten- from [facility] has concerns regarding appt on tomorrow.

## 2012-02-10 NOTE — Telephone Encounter (Signed)
Spoke to patient's daughter Selena Batten who is a Teacher, early years/pre at [employer].Stated her father has appointment with Dr.Taylor 02/11/12.States she is afraid her father will not tell Dr.Taylor he is very sob and has swelling in lower legs.[place] sent to Dr.Taylor.

## 2012-02-11 ENCOUNTER — Encounter: Payer: Self-pay | Admitting: *Deleted

## 2012-02-11 ENCOUNTER — Encounter: Payer: Self-pay | Admitting: Internal Medicine

## 2012-02-11 ENCOUNTER — Other Ambulatory Visit: Payer: Self-pay | Admitting: *Deleted

## 2012-02-11 ENCOUNTER — Ambulatory Visit (INDEPENDENT_AMBULATORY_CARE_PROVIDER_SITE_OTHER): Payer: Medicare Other | Admitting: Internal Medicine

## 2012-02-11 VITALS — BP 142/89 | HR 83 | Ht 70.0 in | Wt 186.0 lb

## 2012-02-11 DIAGNOSIS — I5043 Acute on chronic combined systolic (congestive) and diastolic (congestive) heart failure: Secondary | ICD-10-CM

## 2012-02-11 DIAGNOSIS — I4891 Unspecified atrial fibrillation: Secondary | ICD-10-CM

## 2012-02-11 DIAGNOSIS — I10 Essential (primary) hypertension: Secondary | ICD-10-CM

## 2012-02-11 LAB — BASIC METABOLIC PANEL
CO2: 31 mEq/L (ref 19–32)
Calcium: 8.7 mg/dL (ref 8.4–10.5)
GFR: 40.69 mL/min — ABNORMAL LOW (ref >60.00)
Potassium: 3.5 mEq/L (ref 3.5–5.1)
Sodium: 139 mEq/L (ref 135–145)

## 2012-02-11 LAB — CBC WITH DIFFERENTIAL/PLATELET
Basophils Relative: 0.3 % (ref 0.0–3.0)
Eosinophils Relative: 2.3 % (ref 0.0–5.0)
HCT: 30.5 % — ABNORMAL LOW (ref 39.0–52.0)
Hemoglobin: 9.9 g/dL — ABNORMAL LOW (ref 13.0–17.0)
Lymphocytes Relative: 14.1 % (ref 12.0–46.0)
Monocytes Relative: 10.2 % (ref 3.0–12.0)
Neutro Abs: 5.5 10*3/uL (ref 1.4–7.7)
RBC: 3.84 Mil/uL — ABNORMAL LOW (ref 4.22–5.81)

## 2012-02-11 LAB — MAGNESIUM: Magnesium: 2.4 mg/dL (ref 1.5–2.5)

## 2012-02-11 NOTE — Patient Instructions (Signed)

## 2012-02-13 ENCOUNTER — Encounter (HOSPITAL_COMMUNITY)
Admission: RE | Disposition: A | Discharge status: Home Health | Payer: Self-pay | Source: Ambulatory Visit | Attending: Cardiology

## 2012-02-13 ENCOUNTER — Ambulatory Visit (HOSPITAL_COMMUNITY)
Admission: RE | Admit: 2012-02-13 | Discharge: 2012-02-13 | Disposition: A | Discharge status: Home Health | Payer: Medicare Other | Source: Ambulatory Visit | Attending: Cardiology | Admitting: Cardiology

## 2012-02-13 ENCOUNTER — Encounter: Payer: Self-pay | Admitting: Internal Medicine

## 2012-02-13 ENCOUNTER — Ambulatory Visit (HOSPITAL_COMMUNITY): Payer: Medicare Other | Admitting: Anesthesiology

## 2012-02-13 ENCOUNTER — Encounter (HOSPITAL_COMMUNITY): Payer: Self-pay | Admitting: Anesthesiology

## 2012-02-13 DIAGNOSIS — Z7901 Long term (current) use of anticoagulants: Secondary | ICD-10-CM | POA: Insufficient documentation

## 2012-02-13 DIAGNOSIS — I4891 Unspecified atrial fibrillation: Secondary | ICD-10-CM

## 2012-02-13 DIAGNOSIS — I10 Essential (primary) hypertension: Secondary | ICD-10-CM | POA: Insufficient documentation

## 2012-02-13 HISTORY — PX: CARDIOVERSION: SHX1299

## 2012-02-13 SURGERY — CARDIOVERSION
Anesthesia: Monitor Anesthesia Care

## 2012-02-13 MED ORDER — LIDOCAINE HCL (CARDIAC) 20 MG/ML IV SOLN
INTRAVENOUS | Status: DC | PRN
  Administered 2012-02-13: 30 mg via INTRAVENOUS

## 2012-02-13 MED ORDER — SODIUM CHLORIDE 0.9 % IV SOLN: INTRAVENOUS | Status: DC

## 2012-02-13 MED ORDER — SODIUM CHLORIDE 0.9 % IV SOLN
INTRAVENOUS | Status: DC | PRN
  Administered 2012-02-13: 12:00:00 via INTRAVENOUS

## 2012-02-13 MED ORDER — ETOMIDATE 2 MG/ML IV SOLN
INTRAVENOUS | Status: DC | PRN
  Administered 2012-02-13: 12 mg via INTRAVENOUS

## 2012-02-13 NOTE — Transfer of Care (Signed)
Immediate Anesthesia Transfer of Care Note  Patient: Erik Adams  Procedure(s) Performed: Procedure(s) (LRB) with comments: CARDIOVERSION (N/A)  Patient Location: PACU and Endoscopy Unit  Anesthesia Type:General  Level of Consciousness: awake, alert , oriented and patient cooperative  Airway & Oxygen Therapy: Patient Spontanous Breathing and Patient connected to nasal cannula oxygen  Post-op Assessment: Report given to PACU RN, Post -op Vital signs reviewed and stable and Patient moving all extremities  Post vital signs: Reviewed and stable  Complications: No apparent anesthesia complications

## 2012-02-13 NOTE — Assessment & Plan Note (Signed)
The patient's atrial fibrillation is rate controlled. I suspect he would feel better in sinus rhythm however. That he did not maintain sinus rhythm after his initial cardioversion on amiodarone suggests that he may have not been noted for quite long enough. He is now been on 6 additional weeks of amiodarone at 400 mg daily. I discussed the treatment options with the patient. I would recommend one final attempt at restoration and maintenance of sinus rhythm now that he has been on a fairly high dose of amiodarone for a fairly long period of time. If he does not maintain sinus rhythm after his current cardioversion, then I think there would be likely no way to maintain sinus rhythm even with atrial fibrillation ablation.

## 2012-02-13 NOTE — Assessment & Plan Note (Signed)
Is class 2-3 and he has severe left ventricular dysfunction. Ideally, we would be able to maintain the patient in sinus rhythm and then repeat his echo in 3 months to see if his left ventricular function has improved. If it does not improve, prophylactic ICD implantation would be recommended.

## 2012-02-13 NOTE — Progress Notes (Signed)
HPI [name] is referred today for evaluation of refractory atrial fibrillation and chronic systolic heart failure with severe left ventricular dysfunction, ejection fraction 20%. The patient has been treated in the past with amiodarone. He underwent DC cardioversion but failed to maintain sinus rhythm. He has had long-standing left ventricular dysfunction which has worsened despite maximal medical therapy. He is been on beta blockers, diuretics, ARB drugs, and afterload reduction. The patient has class II heart failure symptoms. He is been maintained on Coumadin. The patient has not had syncope. He denies dietary or medical noncompliance. Allergies  Allergen Reactions  . Ace Inhibitors Other (See Comments)    Unknown reaction  . Clonidine Derivatives Other (See Comments)    Unknown reaction     Current Outpatient Prescriptions  Medication Sig Dispense Refill  . amiodarone (PACERONE) 400 MG tablet Take 1 tablet (400 mg total) by mouth daily.  30 tablet  6  . amLODipine (NORVASC) 10 MG tablet Take 10 mg by mouth daily.      Marland Kitchen aspirin EC 81 MG tablet Take 1 tablet (81 mg total) by mouth daily.  90 tablet  3  . carvedilol (COREG) 25 MG tablet Take 1 tablet (25 mg total) by mouth 2 (two) times daily with a meal.  60 tablet  6  . ferrous sulfate 325 (65 FE) MG tablet Take 1 tablet (325 mg total) by mouth daily with breakfast.  30 tablet  1  . furosemide (LASIX) 40 MG tablet Take 2 tablets (80 mg total) by mouth 2 (two) times daily.  120 tablet  6  . hydrALAZINE (APRESOLINE) 50 MG tablet Take 1.5 tablets (75 mg total) by mouth 3 (three) times daily.  180 tablet  6  . nitroGLYCERIN (NITROSTAT) 0.4 MG SL tablet Place 0.4 mg under the tongue every 5 (five) minutes as needed. For chest pain      . olmesartan (BENICAR) 40 MG tablet Take 40 mg by mouth daily.       . potassium chloride SA (K-DUR,KLOR-CON) 20 MEQ tablet Take 2 tablets (40 mEq total) by mouth 2 (two) times daily.  180 tablet  6  .  rosuvastatin (CRESTOR) 10 MG tablet Take 10 mg by mouth daily.       Marland Kitchen warfarin (COUMADIN) 5 MG tablet Take 5 mg by mouth See admin instructions. Take 1 1/2 tablet (7.5 mg) on Wednesday and take 1 tablet (5 mg) on all other days         Past Medical History  Diagnosis Date  . CAD (coronary artery disease)     a. s/p BMS to mLAD 2000;  b. LHC 3/12:  sig ISR mLAD, RI 80-90%, Dx1 90% (too small for PCI);  PCI:  ION DES to mLAD, ION DES to RI  . HTN (hypertension)   . Hypercholesteremia   . CVA (cerebral infarction)   . CRI (chronic renal insufficiency)     Baseline creatinine of 1.4 to 1.7  . 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 EF is 45 to 50%;  b. echo 10/13:  inf HK, mod LVH, EF 20%, mild AI, mod MR, mod TR, mod BAE, PASP 46  . Atrial fibrillation 11/13/2011    coumadin and amiodarone; s/p DCCV November 2013  . Left atrial thrombus     TEE 11/2011  . Iron deficiency anemia     ROS:   All systems reviewed and negative except as noted in the HPI.   Past Surgical  History  Procedure Date  . Coronary angioplasty with stent placement 2000    BMS to the LAD  . Mastoidectomy   . Coronary angioplasty with stent placement March 2012    DES to LAD and Ramus intermdius  . Tee without cardioversion 12/06/2011    Procedure: TRANSESOPHAGEAL ECHOCARDIOGRAM (TEE);  Surgeon: Pricilla Riffle, MD;  Location: [facility] ENDOSCOPY;  Service: Cardiovascular;  Laterality: N/A;  . Cardioversion 01/06/2012    Procedure: CARDIOVERSION;  Surgeon: Wendall Stade, MD;  Location: [facility] ENDOSCOPY;  Service: Cardiovascular;  Laterality: N/A;  call anesthesia  maybe able to do cardio. early at 12:00     Family History  Problem Relation Age of Onset  . Heart disease Father      History   Social History  . Marital Status: Widowed    Spouse Name: N/A    Number of Children: 5  . Years of Education: N/A   Occupational History  . insurance     Social History Main Topics  . Smoking  status: Former Smoker    Quit date: 02/11/1974  . Smokeless tobacco: Never Used  . Alcohol Use: No  . Drug Use: No  . Sexually Active: Not Currently   Other Topics Concern  . Not on file   Social History Narrative  . No narrative on file     BP 142/89  Pulse 83  Ht 5\' 10"  (1.778 m)  Wt 186 lb (84.369 kg)  BMI 26.69 kg/m2  Physical Exam:  Well appearing 67 year old man, NAD HEENT: Unremarkable Neck:  7 cm JVD, no thyromegally Lungs:  Clear except for rales in the bases bilaterally. No wheezes or rhonchi HEART:  IRegular rate rhythm, no murmurs, no rubs, no clicks Abd:  soft, positive bowel sounds, no organomegally, no rebound, no guarding Ext:  2 plus pulses, no edema, no cyanosis, no clubbing Skin:  No rashes no nodules Neuro:  CN II through XII intact, motor grossly intact  EKG Atrial fibrillation with a controlled ventricular response  Assess/Plan:

## 2012-02-13 NOTE — Anesthesia Preprocedure Evaluation (Addendum)
Anesthesia Evaluation  Patient identified by MRN, date of birth, ID band Patient awake    Reviewed: Allergy & Precautions, H&P , NPO status , Patient's Chart, lab work & pertinent test results, reviewed documented beta blocker date and time   History of Anesthesia Complications Negative for: history of anesthetic complications  Airway Mallampati: II TM Distance: >3 FB Neck ROM: Full    Dental  (+) Caps and Dental Advisory Given   Pulmonary shortness of breath, with exertion and at rest,  breath sounds clear to auscultation  Pulmonary exam normal       Cardiovascular Exercise Tolerance: Poor hypertension, Pt. on medications and Pt. on home beta blockers + CAD + dysrhythmias Atrial Fibrillation Rhythm:Irregular Rate:Normal  Ef 20-25% previous cv 11/13, Lad stenting and restenting , mild AI, previous left atrial thrombus 10/13   Neuro/Psych CVA, No Residual Symptoms    GI/Hepatic negative GI ROS, Neg liver ROS,   Endo/Other  negative endocrine ROS  Renal/GU Renal InsufficiencyRenal disease     Musculoskeletal   Abdominal   Peds  Hematology   Anesthesia Other Findings   Reproductive/Obstetrics                        Anesthesia Physical Anesthesia Plan  ASA: IV  Anesthesia Plan: General   Post-op Pain Management:    Induction: Intravenous  Airway Management Planned: Mask  Additional Equipment:   Intra-op Plan:   Post-operative Plan:   Informed Consent: I have reviewed the patients History and Physical, chart, labs and discussed the procedure including the risks, benefits and alternatives for the proposed anesthesia with the patient or authorized representative who has indicated his/her understanding and acceptance.   Dental advisory given  Plan Discussed with: CRNA and Anesthesiologist  Anesthesia Plan Comments: (Plan routine monitors, GA for cardioversion  )       Anesthesia  Quick Evaluation

## 2012-02-13 NOTE — CV Procedure (Signed)
Electrical Cardioversion Procedure Note FABRICE DYAL [id number] 04-20-1945  Procedure: Electrical Cardioversion Indications:  Atrial Fibrillation  Procedure Details Consent: Risks of procedure as well as the alternatives and risks of each were explained to the (patient/caregiver).  Consent for procedure obtained. Time Out: Verified patient identification, verified procedure, site/side was marked, verified correct patient position, special equipment/implants available, medications/allergies/relevent history reviewed, required imaging and test results available.  Performed  Patient placed on cardiac monitor, pulse oximetry, supplemental oxygen as necessary.  Sedation given: Etomidate Pacer pads placed anterior and posterior chest.  Cardioverted 2 time(s).  Cardioverted at 150J.  Evaluation Findings: Post procedure EKG shows: NSR Complications: None Patient did tolerate procedure well.   Cassell Clement 02/13/2012, 12:02 PM

## 2012-02-13 NOTE — Anesthesia Postprocedure Evaluation (Signed)
  Anesthesia Post-op Note  Patient: Erik Adams  Procedure(s) Performed: Procedure(s) (LRB) with comments: CARDIOVERSION (N/A)  Patient Location: PACU and Endoscopy Unit  Anesthesia Type:General  Level of Consciousness: awake, alert , oriented and patient cooperative  Airway and Oxygen Therapy: Patient Spontanous Breathing and Patient connected to nasal cannula oxygen  Post-op Pain: none  Post-op Assessment: Post-op Vital signs reviewed, Patient's Cardiovascular Status Stable, Respiratory Function Stable, Patent Airway and No signs of Nausea or vomiting  Post-op Vital Signs: Reviewed and stable  Complications: No apparent anesthesia complications

## 2012-02-13 NOTE — Assessment & Plan Note (Signed)
His blood pressure is slightly elevated. He may ultimately require up titration of his beta blocker therapy. He is instructed to maintain a low-sodium diet.

## 2012-02-13 NOTE — H&P (Signed)
This patient of Dr. Elvis Coil presents for elective cardioversion.  He has a history of ischemic heart disease and he has had recent worsening of his left ventricular function.  His most recent echocardiogram has shown an ejection fraction of only 20-25%.  His left ventricular function has worsened since he has been in atrial fibrillation.  A previous plan for elective cardioversion one month ago was aborted because of possible left atrial thrombus.  He has been on adequate Coumadin anticoagulation for the past 4 weeks and now returns for elective cardioversion.  He is also being considered for an ICD by EP.

## 2012-02-14 ENCOUNTER — Encounter (HOSPITAL_COMMUNITY): Payer: Self-pay | Admitting: Cardiology

## 2012-02-18 ENCOUNTER — Telehealth: Payer: Self-pay | Admitting: Cardiology

## 2012-02-18 ENCOUNTER — Ambulatory Visit (INDEPENDENT_AMBULATORY_CARE_PROVIDER_SITE_OTHER): Payer: Medicare Other | Admitting: Nurse Practitioner

## 2012-02-18 ENCOUNTER — Ambulatory Visit (INDEPENDENT_AMBULATORY_CARE_PROVIDER_SITE_OTHER): Payer: Medicare Other

## 2012-02-18 ENCOUNTER — Encounter: Payer: Self-pay | Admitting: Nurse Practitioner

## 2012-02-18 VITALS — BP 150/82 | HR 64 | Ht 70.0 in | Wt 185.0 lb

## 2012-02-18 DIAGNOSIS — I513 Intracardiac thrombosis, not elsewhere classified: Secondary | ICD-10-CM

## 2012-02-18 DIAGNOSIS — D509 Iron deficiency anemia, unspecified: Secondary | ICD-10-CM

## 2012-02-18 DIAGNOSIS — I5189 Other ill-defined heart diseases: Secondary | ICD-10-CM

## 2012-02-18 DIAGNOSIS — Z7901 Long term (current) use of anticoagulants: Secondary | ICD-10-CM

## 2012-02-18 DIAGNOSIS — R0600 Dyspnea, unspecified: Secondary | ICD-10-CM

## 2012-02-18 DIAGNOSIS — I5022 Chronic systolic (congestive) heart failure: Secondary | ICD-10-CM

## 2012-02-18 DIAGNOSIS — R0609 Other forms of dyspnea: Secondary | ICD-10-CM

## 2012-02-18 DIAGNOSIS — R0989 Other specified symptoms and signs involving the circulatory and respiratory systems: Secondary | ICD-10-CM

## 2012-02-18 DIAGNOSIS — I4891 Unspecified atrial fibrillation: Secondary | ICD-10-CM

## 2012-02-18 LAB — CBC WITH DIFFERENTIAL/PLATELET
Basophils Absolute: 0 10*3/uL (ref 0.0–0.1)
Basophils Relative: 0.3 % (ref 0.0–3.0)
Eosinophils Absolute: 0.2 10*3/uL (ref 0.0–0.7)
Eosinophils Relative: 2.5 % (ref 0.0–5.0)
HCT: 30.6 % — ABNORMAL LOW (ref 39.0–52.0)
Hemoglobin: 9.9 g/dL — ABNORMAL LOW (ref 13.0–17.0)
Lymphocytes Relative: 13.3 % (ref 12.0–46.0)
Lymphs Abs: 1.1 10*3/uL (ref 0.7–4.0)
MCHC: 32.4 g/dL (ref 30.0–36.0)
MCV: 78.5 fl (ref 78.0–100.0)
Monocytes Absolute: 0.8 10*3/uL (ref 0.1–1.0)
Monocytes Relative: 10.4 % (ref 3.0–12.0)
Neutro Abs: 5.9 10*3/uL (ref 1.4–7.7)
Neutrophils Relative %: 73.5 % (ref 43.0–77.0)
Platelets: 268 10*3/uL (ref 150.0–400.0)
RBC: 3.9 Mil/uL — ABNORMAL LOW (ref 4.22–5.81)
RDW: 17.6 % — ABNORMAL HIGH (ref 11.5–14.6)
WBC: 8.1 10*3/uL (ref 4.5–10.5)

## 2012-02-18 LAB — BASIC METABOLIC PANEL
BUN: 33 mg/dL — ABNORMAL HIGH (ref 6–23)
CO2: 31 mEq/L (ref 19–32)
Calcium: 8.6 mg/dL (ref 8.4–10.5)
Chloride: 100 mEq/L (ref 96–112)
Creatinine, Ser: 1.9 mg/dL — ABNORMAL HIGH (ref 0.4–1.5)
GFR: 37.51 mL/min — ABNORMAL LOW (ref >60.00)
Glucose, Bld: 92 mg/dL (ref 70–99)
Potassium: 3.5 mEq/L (ref 3.5–5.1)
Sodium: 137 mEq/L (ref 135–145)

## 2012-02-18 LAB — BRAIN NATRIURETIC PEPTIDE: Pro B Natriuretic peptide (BNP): 1723 pg/mL — ABNORMAL HIGH (ref 0.0–100.0)

## 2012-02-18 LAB — POCT INR: INR: 2.6

## 2012-02-18 NOTE — Telephone Encounter (Signed)
Spoke to patient's daughter Marylu Lund.She stated patient has appointment this afternoon with Norma Fredrickson NP.Stated she wanted Lawson Fiscal to be aware patient has a lot of swelling in lower legs,ankles and feet,hard to get shoes on.States he is also having sob.She would like for him to get a cxr.Daughter was told will let Norma Fredrickson NP know.

## 2012-02-18 NOTE — Patient Instructions (Addendum)
Stay on all your current medicines except increase your Lasix to 3 pills two times a day for a week and then back to 3 pills in the am and 2 pills in the evening.  We need to check labs today and check your coumadin level  Dr. Swaziland needs to see you in a month  Stay away from salt  Elevate your legs as much as possible  Call the [facility] office at [phone] if you have any questions, problems or concerns.

## 2012-02-18 NOTE — Progress Notes (Signed)
Erik Adams Date of Birth: April 27, 1945 Medical Record #[id number]  History of Present Illness: Erik Adams is seen back today for a post cardioversion visit. He is seen for Dr. Ladona Ridgel and for Dr. Swaziland. He has had refractory atrial fib with chronic systolic heart failure. He has severe LV dysfunction with an EF of 20% per echo in October of 2013. Has NYHA Class II symptoms. He was started on amiodarone and failed his first attempt at cardioversion. Was continued on amiodarone and had cardioversion repeated earlier this month. His other issues include CAD with prior DES of the LAD and intermediate branches in 2012, iron deficiency anemia, HTN, prior CVA and stage 3 CKD.   He had repeat cardioversion earlier this month with Dr. Patty Sermons which was successful. He has seen EP who has felt that if he is not able to maintain sinus rhythm afterwards then there would be no way to restore sinus rhythm. If he were to maintain sinus for 3 months, we would repeat his echo and then see if we need to proceed with ICD implant.   He comes in today. He is here alone. His daughter called earlier today to report that the patient was having more swelling and more dyspnea and thought he needed a CXR. He says his breathing is ok and that his swelling is not too bad. He does not see any difference in how he feels since the cardioversion. Energy level remains low. He does try to stay active. Trying to minimize his salt use. No chest pain reported. Says he has not had a cough but then says he may have had. Maybe with some frothy sputum. Says he is taking his medicines as prescribed. No fever or chills.   Current Outpatient Prescriptions on File Prior to Visit  Medication Sig Dispense Refill  . amiodarone (PACERONE) 400 MG tablet Take 1 tablet (400 mg total) by mouth daily.  30 tablet  6  . amLODipine (NORVASC) 10 MG tablet Take 10 mg by mouth daily.      Marland Kitchen aspirin EC 81 MG tablet Take 1 tablet (81 mg total) by mouth  daily.  90 tablet  3  . carvedilol (COREG) 25 MG tablet Take 1 tablet (25 mg total) by mouth 2 (two) times daily with a meal.  60 tablet  6  . ferrous sulfate 325 (65 FE) MG tablet Take 1 tablet (325 mg total) by mouth daily with breakfast.  30 tablet  1  . furosemide (LASIX) 40 MG tablet Take 80 mg by mouth 2 (two) times daily. Taking 3 pills in the am and 2 pills in the pm      . hydrALAZINE (APRESOLINE) 50 MG tablet Take 1.5 tablets (75 mg total) by mouth 3 (three) times daily.  180 tablet  6  . nitroGLYCERIN (NITROSTAT) 0.4 MG SL tablet Place 0.4 mg under the tongue every 5 (five) minutes as needed. For chest pain      . olmesartan (BENICAR) 40 MG tablet Take 40 mg by mouth daily.       . potassium chloride SA (K-DUR,KLOR-CON) 20 MEQ tablet Take 2 tablets (40 mEq total) by mouth 2 (two) times daily.  180 tablet  6  . rosuvastatin (CRESTOR) 10 MG tablet Take 10 mg by mouth daily.       Marland Kitchen warfarin (COUMADIN) 5 MG tablet Take 5 mg by mouth See admin instructions. Take 1 1/2 tablet (7.5 mg) on Wednesday and take 1 tablet (5 mg) on all  other days        Allergies  Allergen Reactions  . Ace Inhibitors Other (See Comments)    Unknown reaction  . Clonidine Derivatives Other (See Comments)    Unknown reaction    Past Medical History  Diagnosis Date  . CAD (coronary artery disease)     a. s/p BMS to mLAD 2000;  b. LHC 3/12:  sig ISR mLAD, RI 80-90%, Dx1 90% (too small for PCI);  PCI:  ION DES to mLAD, ION DES to RI  . HTN (hypertension)   . Hypercholesteremia   . CVA (cerebral infarction)   . CRI (chronic renal insufficiency)     Baseline creatinine of 1.4 to 1.7  . 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 EF is 45 to 50%;  b. echo 10/13:  inf HK, mod LVH, EF 20%, mild AI, mod MR, mod TR, mod BAE, PASP 46  . Atrial fibrillation 11/13/2011    coumadin and amiodarone; s/p DCCV November 2013; repeat DCCV Feb 13, 2012  . Left atrial thrombus     TEE 11/2011  . Iron  deficiency anemia     Past Surgical History  Procedure Date  . Coronary angioplasty with stent placement 2000    BMS to the LAD  . Mastoidectomy   . Coronary angioplasty with stent placement March 2012    DES to LAD and Ramus intermdius  . Tee without cardioversion 12/06/2011    Procedure: TRANSESOPHAGEAL ECHOCARDIOGRAM (TEE);  Surgeon: Pricilla Riffle, MD;  Location: [facility] ENDOSCOPY;  Service: Cardiovascular;  Laterality: N/A;  . Cardioversion 01/06/2012    Procedure: CARDIOVERSION;  Surgeon: Wendall Stade, MD;  Location: [facility] ENDOSCOPY;  Service: Cardiovascular;  Laterality: N/A;  call anesthesia  maybe able to do cardio. early at 12:00  . Cardioversion 02/13/2012    Procedure: CARDIOVERSION;  Surgeon: Cassell Clement, MD;  Location: [facility] ENDOSCOPY;  Service: Cardiovascular;  Laterality: N/A;    History  Smoking status  . Former Smoker  . Quit date: 02/11/1974  Smokeless tobacco  . Never Used    History  Alcohol Use No    Family History  Problem Relation Age of Onset  . Heart disease Father     Review of Systems: The review of systems is per the HPI.  All other systems were reviewed and are negative.  Physical Exam: BP 150/82  Pulse 64  Ht 5\' 10"  (1.778 m)  Wt 185 lb (83.915 kg)  BMI 26.54 kg/m2 Patient is very pleasant and in no acute distress. His weight is down one pound from his last visit with Dr. Ladona Ridgel but up 7 pounds since his last visit with me. Skin is warm and dry. Color is normal.  HEENT is unremarkable. Normocephalic/atraumatic. PERRL. Sclera are nonicteric. Neck is supple. No masses. No JVD. Lungs are clear but with some crackles in the bases. Cardiac exam shows a regular rate and rhythm today. Abdomen is soft. Extremities are with 1+ edema bilaterally. Gait and ROM are intact. No gross neurologic deficits noted.  LABORATORY DATA: EKG shows sinus rhythm today. It is reviewed with Dr. Ladona Ridgel  Lab Results  Component Value Date   WBC 7.6 02/11/2012   HGB 9.9*  02/11/2012   HCT 30.5* 02/11/2012   PLT 253.0 02/11/2012   GLUCOSE 102* 02/11/2012   CHOL 98 11/13/2011   TRIG 43.0 11/13/2011   HDL 34.50* 11/13/2011   LDLCALC 55 11/13/2011   ALT 21 11/13/2011   AST 27 11/13/2011  NA 139 02/11/2012   K 3.5 02/11/2012   CL 100 02/11/2012   CREATININE 1.8* 02/11/2012   BUN 31* 02/11/2012   CO2 31 02/11/2012   TSH 6.34* 11/13/2011   INR 2.6 02/18/2012     Assessment / Plan: 1. PAF - remains in sinus by EKG today. Unfortunately, he does not really feel any different. I have talked with Dr. Ladona Ridgel. We are going to leave him on the amiodarone 400 mg a day for another 4 weeks, then go to 300 mg for 3 months and then 200 mg a day. He will need a repeat echo in 3 months and then decide about ICD implant.   2. Chronic systolic heart failure - I have increased his Lasix to 120 mg BID for one week. Checking labs today as well. Salt restriction is imperative.  3. HTN - still with uncontrolled HTN despite his medicines. I wonder about his overall compliance.   He is to see Dr. Swaziland in one month. We are checking labs today. Lasix is increased.   Patient is agreeable to this plan and will call if any problems develop in the interim.

## 2012-02-18 NOTE — Telephone Encounter (Signed)
New problem:   C/O swelling in legs & ankles.  Daughter is asking for xray to be done.

## 2012-02-27 ENCOUNTER — Encounter (HOSPITAL_COMMUNITY): Payer: Self-pay | Admitting: *Deleted

## 2012-02-27 ENCOUNTER — Inpatient Hospital Stay (HOSPITAL_COMMUNITY)
Admission: EM | Admit: 2012-02-27 | Discharge: 2012-03-03 | DRG: 208 | Disposition: A | Discharge status: Home / Self Care | Payer: Medicare Other | Attending: Internal Medicine | Admitting: Internal Medicine

## 2012-02-27 ENCOUNTER — Emergency Department (HOSPITAL_COMMUNITY): Payer: Medicare Other

## 2012-02-27 DIAGNOSIS — N183 Chronic kidney disease, stage 3 unspecified: Secondary | ICD-10-CM | POA: Diagnosis present

## 2012-02-27 DIAGNOSIS — D509 Iron deficiency anemia, unspecified: Secondary | ICD-10-CM | POA: Diagnosis present

## 2012-02-27 DIAGNOSIS — J9589 Other postprocedural complications and disorders of respiratory system, not elsewhere classified: Secondary | ICD-10-CM

## 2012-02-27 DIAGNOSIS — R509 Fever, unspecified: Secondary | ICD-10-CM

## 2012-02-27 DIAGNOSIS — Z7901 Long term (current) use of anticoagulants: Secondary | ICD-10-CM

## 2012-02-27 DIAGNOSIS — N179 Acute kidney failure, unspecified: Secondary | ICD-10-CM | POA: Diagnosis not present

## 2012-02-27 DIAGNOSIS — N170 Acute kidney failure with tubular necrosis: Secondary | ICD-10-CM | POA: Diagnosis not present

## 2012-02-27 DIAGNOSIS — I4891 Unspecified atrial fibrillation: Secondary | ICD-10-CM | POA: Diagnosis present

## 2012-02-27 DIAGNOSIS — Z955 Presence of coronary angioplasty implant and graft: Secondary | ICD-10-CM

## 2012-02-27 DIAGNOSIS — R0902 Hypoxemia: Secondary | ICD-10-CM

## 2012-02-27 DIAGNOSIS — I214 Non-ST elevation (NSTEMI) myocardial infarction: Secondary | ICD-10-CM | POA: Diagnosis present

## 2012-02-27 DIAGNOSIS — R0602 Shortness of breath: Secondary | ICD-10-CM

## 2012-02-27 DIAGNOSIS — I509 Heart failure, unspecified: Secondary | ICD-10-CM | POA: Diagnosis present

## 2012-02-27 DIAGNOSIS — J189 Pneumonia, unspecified organism: Secondary | ICD-10-CM

## 2012-02-27 DIAGNOSIS — I129 Hypertensive chronic kidney disease with stage 1 through stage 4 chronic kidney disease, or unspecified chronic kidney disease: Secondary | ICD-10-CM | POA: Diagnosis present

## 2012-02-27 DIAGNOSIS — Z79899 Other long term (current) drug therapy: Secondary | ICD-10-CM

## 2012-02-27 DIAGNOSIS — I10 Essential (primary) hypertension: Secondary | ICD-10-CM

## 2012-02-27 DIAGNOSIS — J80 Acute respiratory distress syndrome: Secondary | ICD-10-CM

## 2012-02-27 DIAGNOSIS — E7800 Pure hypercholesterolemia, unspecified: Secondary | ICD-10-CM

## 2012-02-27 DIAGNOSIS — Z8673 Personal history of transient ischemic attack (TIA), and cerebral infarction without residual deficits: Secondary | ICD-10-CM

## 2012-02-27 DIAGNOSIS — I5042 Chronic combined systolic (congestive) and diastolic (congestive) heart failure: Secondary | ICD-10-CM | POA: Diagnosis present

## 2012-02-27 DIAGNOSIS — J9690 Respiratory failure, unspecified, unspecified whether with hypoxia or hypercapnia: Secondary | ICD-10-CM

## 2012-02-27 DIAGNOSIS — I2589 Other forms of chronic ischemic heart disease: Secondary | ICD-10-CM | POA: Diagnosis present

## 2012-02-27 DIAGNOSIS — E876 Hypokalemia: Secondary | ICD-10-CM | POA: Diagnosis not present

## 2012-02-27 DIAGNOSIS — I513 Intracardiac thrombosis, not elsewhere classified: Secondary | ICD-10-CM

## 2012-02-27 DIAGNOSIS — I5043 Acute on chronic combined systolic (congestive) and diastolic (congestive) heart failure: Secondary | ICD-10-CM

## 2012-02-27 DIAGNOSIS — I5021 Acute systolic (congestive) heart failure: Secondary | ICD-10-CM

## 2012-02-27 DIAGNOSIS — I2510 Atherosclerotic heart disease of native coronary artery without angina pectoris: Secondary | ICD-10-CM | POA: Diagnosis present

## 2012-02-27 DIAGNOSIS — J9600 Acute respiratory failure, unspecified whether with hypoxia or hypercapnia: Principal | ICD-10-CM

## 2012-02-27 LAB — BLOOD GAS, ARTERIAL
Acid-Base Excess: 0.9 mmol/L (ref 0.0–2.0)
Acid-Base Excess: 3 mmol/L — ABNORMAL HIGH (ref 0.0–2.0)
Bicarbonate: 24.6 mEq/L — ABNORMAL HIGH (ref 20.0–24.0)
Bicarbonate: 23.8 mEq/L (ref 20.0–24.0)
Bicarbonate: 26.3 mEq/L — ABNORMAL HIGH (ref 20.0–24.0)
FIO2: 1 %
FIO2: 0.5 %
MECHVT: 580 mL
O2 Saturation: 95.3 %
Patient temperature: 99.7
TCO2: 22.6 mmol/L (ref 0–100)
TCO2: 21.9 mmol/L (ref 0–100)
TCO2: 24.3 mmol/L (ref 0–100)
pCO2 arterial: 34.9 mmHg — ABNORMAL LOW (ref 35.0–45.0)
pCO2 arterial: 32.8 mmHg — ABNORMAL LOW (ref 35.0–45.0)
pH, Arterial: 7.462 — ABNORMAL HIGH (ref 7.350–7.450)
pO2, Arterial: 133 mmHg — ABNORMAL HIGH (ref 80.0–100.0)
pO2, Arterial: 74.8 mmHg — ABNORMAL LOW (ref 80.0–100.0)

## 2012-02-27 LAB — COMPREHENSIVE METABOLIC PANEL
ALT: 33 U/L (ref 0–53)
Alkaline Phosphatase: 93 U/L (ref 39–117)
CO2: 24 mEq/L (ref 19–32)
Chloride: 97 mEq/L (ref 96–112)
GFR calc Af Amer: 38 mL/min — ABNORMAL LOW (ref >90)
GFR calc non Af Amer: 32 mL/min — ABNORMAL LOW (ref >90)
Glucose, Bld: 138 mg/dL — ABNORMAL HIGH (ref 70–99)
Potassium: 4 mEq/L (ref 3.5–5.1)
Sodium: 137 mEq/L (ref 135–145)
Total Bilirubin: 1 mg/dL (ref 0.3–1.2)

## 2012-02-27 LAB — PRO B NATRIURETIC PEPTIDE: Pro B Natriuretic peptide (BNP): 18223 pg/mL — ABNORMAL HIGH (ref 0–125)

## 2012-02-27 LAB — URINALYSIS, ROUTINE W REFLEX MICROSCOPIC
Glucose, UA: NEGATIVE mg/dL
Hgb urine dipstick: NEGATIVE
Ketones, ur: NEGATIVE mg/dL
Leukocytes, UA: NEGATIVE
Protein, ur: NEGATIVE mg/dL
pH: 5.5 (ref 5.0–8.0)

## 2012-02-27 LAB — PROTIME-INR: INR: 2.46 — ABNORMAL HIGH (ref 0.00–1.49)

## 2012-02-27 LAB — CBC WITH DIFFERENTIAL/PLATELET
Eosinophils Relative: 1 % (ref 0–5)
Lymphocytes Relative: 3 % — ABNORMAL LOW (ref 12–46)
Lymphs Abs: 0.4 10*3/uL — ABNORMAL LOW (ref 0.7–4.0)
MCV: 78.9 fL (ref 78.0–100.0)
Platelets: 307 10*3/uL (ref 150–400)
RBC: 4.26 MIL/uL (ref 4.22–5.81)
WBC: 10.8 10*3/uL — ABNORMAL HIGH (ref 4.0–10.5)

## 2012-02-27 LAB — TROPONIN I: Troponin I: 4.37 ng/mL (ref <0.30)

## 2012-02-27 LAB — GLUCOSE, CAPILLARY: Glucose-Capillary: 136 mg/dL — ABNORMAL HIGH (ref 70–99)

## 2012-02-27 LAB — CORTISOL: Cortisol, Plasma: 44.9 ug/dL

## 2012-02-27 LAB — APTT: aPTT: 48 seconds — ABNORMAL HIGH (ref 24–37)

## 2012-02-27 LAB — MAGNESIUM: Magnesium: 2.1 mg/dL (ref 1.5–2.5)

## 2012-02-27 MED ORDER — DEXTROSE 5 % IV SOLN
1.0000 g | INTRAVENOUS | Status: DC
  Administered 2012-02-28 – 2012-02-29 (×2): 1 g via INTRAVENOUS
  Filled 2012-02-27 (×3): qty 10

## 2012-02-27 MED ORDER — FENTANYL CITRATE 0.05 MG/ML IJ SOLN
100.0000 ug | Freq: Once | INTRAMUSCULAR | Status: AC
  Administered 2012-02-27: 100 ug via INTRAVENOUS

## 2012-02-27 MED ORDER — PIPERACILLIN-TAZOBACTAM 3.375 G IVPB
3.3750 g | Freq: Once | INTRAVENOUS | Status: DC
  Filled 2012-02-27: qty 50

## 2012-02-27 MED ORDER — ASPIRIN 300 MG RE SUPP
300.0000 mg | Freq: Every day | RECTAL | Status: DC
  Administered 2012-02-28: 300 mg via RECTAL
  Filled 2012-02-27: qty 1

## 2012-02-27 MED ORDER — PANTOPRAZOLE SODIUM 40 MG IV SOLR
40.0000 mg | Freq: Every day | INTRAVENOUS | Status: DC
  Administered 2012-02-27 – 2012-02-28 (×2): 40 mg via INTRAVENOUS
  Filled 2012-02-27 (×3): qty 40

## 2012-02-27 MED ORDER — MIDAZOLAM HCL 2 MG/2ML IJ SOLN
2.0000 mg | Freq: Once | INTRAMUSCULAR | Status: AC
  Administered 2012-02-27: 2 mg via INTRAVENOUS

## 2012-02-27 MED ORDER — OSELTAMIVIR PHOSPHATE 75 MG PO CAPS: 150.0000 mg | ORAL_CAPSULE | Freq: Two times a day (BID) | ORAL | Status: DC

## 2012-02-27 MED ORDER — MIDAZOLAM HCL 2 MG/2ML IJ SOLN
INTRAMUSCULAR | Status: AC
  Administered 2012-02-27: 2 mg via INTRAVENOUS
  Filled 2012-02-27: qty 4

## 2012-02-27 MED ORDER — OXEPA PO LIQD
1000.0000 mL | ORAL | Status: DC
  Administered 2012-02-28: 1000 mL
  Filled 2012-02-27 (×2): qty 1000

## 2012-02-27 MED ORDER — FENTANYL CITRATE 0.05 MG/ML IJ SOLN
25.0000 ug | INTRAMUSCULAR | Status: DC | PRN
  Administered 2012-02-27 – 2012-02-29 (×3): 50 ug via INTRAVENOUS
  Filled 2012-02-27 (×3): qty 2

## 2012-02-27 MED ORDER — LEVOFLOXACIN IN D5W 750 MG/150ML IV SOLN
750.0000 mg | INTRAVENOUS | Status: DC
  Administered 2012-02-27: 750 mg via INTRAVENOUS
  Filled 2012-02-27: qty 150

## 2012-02-27 MED ORDER — ETOMIDATE 2 MG/ML IV SOLN
INTRAVENOUS | Status: AC
  Administered 2012-02-27: 20 mg via INTRAVENOUS
  Filled 2012-02-27: qty 10

## 2012-02-27 MED ORDER — CEFTRIAXONE SODIUM 1 G IJ SOLR: 1.0000 g | INTRAMUSCULAR | Status: DC

## 2012-02-27 MED ORDER — SODIUM CHLORIDE 0.9 % IV BOLUS (SEPSIS)
250.0000 mL | Freq: Once | INTRAVENOUS | Status: AC
  Administered 2012-02-27: 250 mL via INTRAVENOUS

## 2012-02-27 MED ORDER — CEFTRIAXONE SODIUM 1 G IJ SOLR
1.0000 g | Freq: Once | INTRAMUSCULAR | Status: AC
  Administered 2012-02-27: 1 g via INTRAVENOUS
  Filled 2012-02-27: qty 10

## 2012-02-27 MED ORDER — MIDAZOLAM HCL 2 MG/2ML IJ SOLN: 2.0000 mg | Freq: Once | INTRAMUSCULAR | Status: DC

## 2012-02-27 MED ORDER — PIPERACILLIN-TAZOBACTAM 3.375 G IVPB
3.3750 g | Freq: Once | INTRAVENOUS | Status: AC
  Administered 2012-02-27: 3.375 g via INTRAVENOUS

## 2012-02-27 MED ORDER — FUROSEMIDE 10 MG/ML IJ SOLN
80.0000 mg | Freq: Once | INTRAMUSCULAR | Status: AC
  Administered 2012-02-27: 80 mg via INTRAVENOUS
  Filled 2012-02-27: qty 8

## 2012-02-27 MED ORDER — OSELTAMIVIR PHOSPHATE 75 MG PO CAPS
75.0000 mg | ORAL_CAPSULE | Freq: Once | ORAL | Status: DC
  Filled 2012-02-27: qty 1

## 2012-02-27 MED ORDER — HEPARIN SODIUM (PORCINE) 5000 UNIT/ML IJ SOLN: 5000.0000 [IU] | Freq: Three times a day (TID) | INTRAMUSCULAR | Status: DC

## 2012-02-27 MED ORDER — VANCOMYCIN HCL IN DEXTROSE 1-5 GM/200ML-% IV SOLN
1000.0000 mg | INTRAVENOUS | Status: DC
  Filled 2012-02-27: qty 200

## 2012-02-27 MED ORDER — ROCURONIUM BROMIDE 50 MG/5ML IV SOLN
INTRAVENOUS | Status: AC
  Administered 2012-02-27: 75 mg
  Filled 2012-02-27: qty 2

## 2012-02-27 MED ORDER — SUCCINYLCHOLINE CHLORIDE 20 MG/ML IJ SOLN
INTRAMUSCULAR | Status: AC
  Filled 2012-02-27: qty 5

## 2012-02-27 MED ORDER — ROCURONIUM BROMIDE 50 MG/5ML IV SOLN: 75.0000 mg | Freq: Once | INTRAVENOUS | Status: DC

## 2012-02-27 MED ORDER — SODIUM CHLORIDE 0.9 % IV SOLN
25.0000 ug/h | INTRAVENOUS | Status: DC
  Filled 2012-02-27: qty 50

## 2012-02-27 MED ORDER — ETOMIDATE 2 MG/ML IV SOLN
0.3000 mg/kg | Freq: Once | INTRAVENOUS | Status: AC
  Administered 2012-02-27: 20 mg via INTRAVENOUS

## 2012-02-27 MED ORDER — SODIUM CHLORIDE 0.9 % IV SOLN
INTRAVENOUS | Status: DC
  Administered 2012-02-27: 17:00:00 via INTRAVENOUS

## 2012-02-27 MED ORDER — NOREPINEPHRINE BITARTRATE 1 MG/ML IJ SOLN
5.0000 ug/min | INTRAVENOUS | Status: DC
  Filled 2012-02-27: qty 8

## 2012-02-27 MED ORDER — OSELTAMIVIR PHOSPHATE 6 MG/ML PO SUSR
150.0000 mg | Freq: Two times a day (BID) | ORAL | Status: DC
  Administered 2012-02-27 – 2012-02-29 (×4): 150 mg via ORAL
  Filled 2012-02-27 (×5): qty 25

## 2012-02-27 MED ORDER — MIDAZOLAM HCL 5 MG/ML IJ SOLN
INTRAMUSCULAR | Status: AC
  Administered 2012-02-27: 2 mg
  Filled 2012-02-27: qty 1

## 2012-02-27 MED ORDER — FENTANYL CITRATE 0.05 MG/ML IJ SOLN
INTRAMUSCULAR | Status: AC
  Administered 2012-02-27: 100 ug via INTRAVENOUS
  Filled 2012-02-27: qty 4

## 2012-02-27 MED ORDER — MIDAZOLAM HCL 5 MG/ML IJ SOLN
1.0000 mg | INTRAMUSCULAR | Status: DC | PRN
  Administered 2012-02-27 – 2012-02-29 (×2): 2 mg via INTRAVENOUS
  Filled 2012-02-27: qty 2
  Filled 2012-02-27: qty 1

## 2012-02-27 MED ORDER — LIDOCAINE HCL (CARDIAC) 20 MG/ML IV SOLN
INTRAVENOUS | Status: AC
  Filled 2012-02-27: qty 5

## 2012-02-27 MED ORDER — ETOMIDATE 2 MG/ML IV SOLN
INTRAVENOUS | Status: AC
  Filled 2012-02-27: qty 20

## 2012-02-27 MED ORDER — VANCOMYCIN HCL IN DEXTROSE 1-5 GM/200ML-% IV SOLN
1000.0000 mg | Freq: Once | INTRAVENOUS | Status: AC
  Administered 2012-02-27: 1000 mg via INTRAVENOUS
  Filled 2012-02-27: qty 200

## 2012-02-27 NOTE — ED Notes (Signed)
RT called for BiPap and ABG.

## 2012-02-27 NOTE — ED Notes (Signed)
When patient arrived to the room his stats was 62%.

## 2012-02-27 NOTE — Progress Notes (Signed)
ANTIBIOTIC CONSULT NOTE - INITIAL  Pharmacy Consult for Vancomycin/Levaquin Indication: pneumonia  Allergies  Allergen Reactions  . Ace Inhibitors Other (See Comments)    Unknown reaction  . Clonidine Derivatives Other (See Comments)    Unknown reaction    Patient Measurements: Height: 5\' 10"  (177.8 cm) Weight: 180 lb (81.647 kg) IBW/kg (Calculated) : 73    Vital Signs: Temp: 101.5 F (38.6 C) (01/16 1356) Temp src: Rectal (01/16 1356) BP: 142/103 mmHg (01/16 1419) Pulse Rate: 74  (01/16 1419) Intake/Output from previous day:   Intake/Output from this shift:    Labs:  Basename 02/27/12 1345  WBC 10.8*  HGB 10.4*  PLT 307  LABCREA --  CREATININE 2.02*   Estimated Creatinine Clearance: 36.6 ml/min (by C-G formula based on Cr of 2.02). No results found for this basename: VANCOTROUGH:2,VANCOPEAK:2,VANCORANDOM:2,GENTTROUGH:2,GENTPEAK:2,GENTRANDOM:2,TOBRATROUGH:2,TOBRAPEAK:2,TOBRARND:2,AMIKACINPEAK:2,AMIKACINTROU:2,AMIKACIN:2, in the last 72 hours   Microbiology: No results found for this or any previous visit (from the past 720 hour(s)).  Medical History: Past Medical History  Diagnosis Date  . CAD (coronary artery disease)     a. s/p BMS to mLAD 2000;  b. LHC 3/12:  sig ISR mLAD, RI 80-90%, Dx1 90% (too small for PCI);  PCI:  ION DES to mLAD, ION DES to RI  . HTN (hypertension)   . Hypercholesteremia   . CVA (cerebral infarction)   . CRI (chronic renal insufficiency)     Baseline creatinine of 1.4 to 1.7  . 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 EF is 45 to 50%;  b. echo 10/13:  inf HK, mod LVH, EF 20%, mild AI, mod MR, mod TR, mod BAE, PASP 46  . Atrial fibrillation 11/13/2011    coumadin and amiodarone; s/p DCCV November 2013; repeat DCCV Feb 13, 2012  . Left atrial thrombus     TEE 11/2011  . Iron deficiency anemia     Medications:  Anti-infectives     Start     Dose/Rate Route Frequency Ordered Stop   02/28/12 1700   cefTRIAXone  (ROCEPHIN) 1 g in dextrose 5 % 50 mL IVPB        1 g 100 mL/hr over 30 Minutes Intravenous Every 24 hours 02/27/12 1610     02/27/12 2200   oseltamivir (TAMIFLU) 6 MG/ML suspension 150 mg        150 mg Oral 2 times daily 02/27/12 1557 03/03/12 2159   02/27/12 2200   oseltamivir (TAMIFLU) capsule 150 mg  Status:  Discontinued        150 mg Oral 2 times daily 02/27/12 1559 02/27/12 1600   02/27/12 1700   levofloxacin (LEVAQUIN) IVPB 750 mg        750 mg 100 mL/hr over 90 Minutes Intravenous Every 48 hours 02/27/12 1602     02/27/12 1615   cefTRIAXone (ROCEPHIN) 1 g in dextrose 5 % 50 mL IVPB        1 g 100 mL/hr over 30 Minutes Intravenous  Once 02/27/12 1606     02/27/12 1600   cefTRIAXone (ROCEPHIN) injection 1 g  Status:  Discontinued        1 g Intramuscular Every 24 hours 02/27/12 1559 02/27/12 1606   02/27/12 1430   oseltamivir (TAMIFLU) capsule 75 mg  Status:  Discontinued        75 mg Oral  Once 02/27/12 1423 02/27/12 1557   02/27/12 1400   vancomycin (VANCOCIN) IVPB 1000 mg/200 mL premix  1,000 mg 200 mL/hr over 60 Minutes Intravenous  Once 02/27/12 1358     02/27/12 1400   piperacillin-tazobactam (ZOSYN) IVPB 3.375 g  Status:  Discontinued        3.375 g 100 mL/hr over 30 Minutes Intravenous  Once 02/27/12 1358 02/27/12 1555         Assessment:  Erik Adams admitted 1/16 w/ hypoxia, dyspnea and fever. Pt intubated this afternoon  Pharmacy asked to dose Levaquin and Vancomycin for suspected PNA. Pt also on D1 Rocephin 1g IV q24h and Tamiflu 75mg  per tube BID  Vancomycin 1g IV given in ER today ~1600  Scr elevated, 2, CrCl ~ 88ml/min (60ml/min/1.73m2).  Slightly elevated WBC 10.8, Tm 101.5  Legionella, strep pneumo urine antigen tests pending, blood cultures pending, Urine culture pending, influenza panel pending  Goal of Therapy:  Vancomycin trough level 15-20 mcg/ml Appropriate dose of Levaquin Resolution of infection  Plan:   Levaquin 750mg  IV  q48h  Vancomycin 1g IV q24h  Follow labs, vitals and cultures  Vancomycin trough at steady state  Adjust doses as necessary  Gwen Her PharmD  [phone] 02/27/2012 4:13 PM

## 2012-02-27 NOTE — Procedures (Signed)
Central Venous Catheter Insertion Procedure Note ELDRA WORD [id number] [birth year]  Procedure: Insertion of Central Venous Catheter Right IJ Indications: Assessment of intravascular volume, Drug and/or fluid administration and Frequent blood sampling  Procedure Details Consent: Time Out: Verified patient identification, verified procedure, site/side was marked, verified correct patient position, special equipment/implants available, medications/allergies/relevent history reviewed, required imaging and test results available.  Performed  Maximum sterile technique was used including antiseptics, cap, gloves, gown, hand hygiene, mask and sheet. Skin prep: Chlorhexidine; local anesthetic administered A antimicrobial bonded/coated triple lumen catheter was placed in the right internal jugular vein using the Seldinger technique. Ultrasound guidance used.yes Catheter placed to 17 cm. Blood aspirated via all 3 ports and then flushed x 3. Line sutured x 2 and dressing applied.  Evaluation Blood flow good Complications: No apparent complications Patient did tolerate procedure well. Chest X-ray ordered to verify placement.  CXR: pending.  Scribed by Kandice Robinsons, RN ACNP Student [employer] for:  Devra Dopp ACNP [facility] Pollack PCCM Pager [phone] till 3 pm If no answer page [phone] 02/27/2012, 3:49 PM   Tolerated well [place]  Mcarthur Rossetti. Tyson Alias, MD, FACP Pgr: [phone] [facility]

## 2012-02-27 NOTE — ED Provider Notes (Addendum)
History     CSN: [id number]  Arrival date & time 02/27/12  1340   First MD Initiated Contact with Patient 02/27/12 1346      Chief Complaint  Patient presents with  . Shortness of Breath  . Fatigue     The history is provided by the patient, a relative and medical records.   the patient was brought emergently to the emergency department by his son who carried a man.  The patient's had increasing shortness of breath over the past several days and when he presented to the emergency department his initial oxygen saturation was 60% on room and the patient seemed altered confused with some right-sided weakness.  The patient was quickly placed on a nonrebreather mask with improvement in his hypoxia resulting in improvement in his mental status as well as improvement in his weakness.  He has no weakness of his upper lower extremities now.  Family reports that they saw him earlier today and he seemed to have increased work of breathing.  There has been some flu-related illnesses in family members and one of the family members is currently on Tamiflu.  The patient was given a dose of Tamiflu by his family members last night.  He has not been tested for influenza.  He percent to the emergency department had a fever of 101.5.  He has a history of severe congestive heart failure with his last ejection fraction noted to be 20-25%.  He states compliance with his medications and denies dietary indiscretion.  He was recently hospitalized in October 2013 for a CHF exacerbation resulting in diuresis of 8-1/2 L of fluid.  The patient denies melena hematochezia.  He's had chills without myalgias.  He has a history of atrial fibrillation and recent left atrial thrombus and is currently on Coumadin.  No history of pulmonary embolism or DVT.  Past Medical History  Diagnosis Date  . CAD (coronary artery disease)     a. s/p BMS to mLAD 2000;  b. LHC 3/12:  sig ISR mLAD, RI 80-90%, Dx1 90% (too small for PCI);  PCI:  ION  DES to mLAD, ION DES to RI  . HTN (hypertension)   . Hypercholesteremia   . CVA (cerebral infarction)   . CRI (chronic renal insufficiency)     Baseline creatinine of 1.4 to 1.7  . 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 EF is 45 to 50%;  b. echo 10/13:  inf HK, mod LVH, EF 20%, mild AI, mod MR, mod TR, mod BAE, PASP 46  . Atrial fibrillation 11/13/2011    coumadin and amiodarone; s/p DCCV November 2013; repeat DCCV Feb 13, 2012  . Left atrial thrombus     TEE 11/2011  . Iron deficiency anemia     Past Surgical History  Procedure Date  . Coronary angioplasty with stent placement 2000    BMS to the LAD  . Mastoidectomy   . Coronary angioplasty with stent placement March 2012    DES to LAD and Ramus intermdius  . Tee without cardioversion 12/06/2011    Procedure: TRANSESOPHAGEAL ECHOCARDIOGRAM (TEE);  Surgeon: Pricilla Riffle, MD;  Location: [facility] ENDOSCOPY;  Service: Cardiovascular;  Laterality: N/A;  . Cardioversion 01/06/2012    Procedure: CARDIOVERSION;  Surgeon: Wendall Stade, MD;  Location: [facility] ENDOSCOPY;  Service: Cardiovascular;  Laterality: N/A;  call anesthesia  maybe able to do cardio. early at 12:00  . Cardioversion 02/13/2012    Procedure: CARDIOVERSION;  Surgeon:  Cassell Clement, MD;  Location: [facility] ENDOSCOPY;  Service: Cardiovascular;  Laterality: N/A;    Family History  Problem Relation Age of Onset  . Heart disease Father     History  Substance Use Topics  . Smoking status: Former Smoker    Quit date: 02/11/1974  . Smokeless tobacco: Never Used  . Alcohol Use: No      Review of Systems  All other systems reviewed and are negative.    Allergies  Ace inhibitors and Clonidine derivatives  Home Medications   Current Outpatient Rx  Name  Route  Sig  Dispense  Refill  . AMIODARONE HCL 400 MG PO TABS   Oral   Take 1 tablet (400 mg total) by mouth daily.   30 tablet   6   . AMLODIPINE BESYLATE 10 MG PO TABS   Oral   Take 10 mg by  mouth daily.         . ASPIRIN EC 81 MG PO TBEC   Oral   Take 1 tablet (81 mg total) by mouth daily.   90 tablet   3   . CARVEDILOL 25 MG PO TABS   Oral   Take 1 tablet (25 mg total) by mouth 2 (two) times daily with a meal.   60 tablet   6   . FERROUS SULFATE 325 (65 FE) MG PO TABS   Oral   Take 1 tablet (325 mg total) by mouth daily with breakfast.   30 tablet   1   . FUROSEMIDE 40 MG PO TABS   Oral   Take 80 mg by mouth 2 (two) times daily. Taking 3 pills in the am and 2 pills in the pm         . HYDRALAZINE HCL 50 MG PO TABS   Oral   Take 1.5 tablets (75 mg total) by mouth 3 (three) times daily.   180 tablet   6   . NITROGLYCERIN 0.4 MG SL SUBL   Sublingual   Place 0.4 mg under the tongue every 5 (five) minutes as needed. For chest pain         . OLMESARTAN MEDOXOMIL 40 MG PO TABS   Oral   Take 40 mg by mouth daily.          Marland Kitchen POTASSIUM CHLORIDE CRYS ER 20 MEQ PO TBCR   Oral   Take 2 tablets (40 mEq total) by mouth 2 (two) times daily.   180 tablet   6   . ROSUVASTATIN CALCIUM 10 MG PO TABS   Oral   Take 10 mg by mouth daily.          . WARFARIN SODIUM 5 MG PO TABS   Oral   Take 5 mg by mouth See admin instructions. Take 1 1/2 tablet (7.5 mg) on Wednesday and take 1 tablet (5 mg) on all other days           BP 142/103  Temp 101.5 F (38.6 C) (Rectal)  Resp 22  Ht 5\' 10"  (1.778 m)  Wt 180 lb (81.647 kg)  BMI 25.83 kg/m2  SpO2 94%  Physical Exam  Nursing note and vitals reviewed. Constitutional: He appears well-developed and well-nourished. He appears distressed.  HENT:  Head: Normocephalic and atraumatic.  Eyes: EOM are normal. Pupils are equal, round, and reactive to light.  Neck: Normal range of motion.  Cardiovascular: Normal rate, regular rhythm, normal heart sounds and intact distal pulses.   Pulmonary/Chest: He is in  respiratory distress. He has rales.       Rhonchi throughout  Abdominal: Soft. He exhibits no distension.  There is no tenderness.  Musculoskeletal: Normal range of motion.       2+ pitting edema bilaterally  Neurological: He is alert.       5/5 strength in major muscle groups of  bilateral upper and lower extremities. Speech normal. No facial asymetry.   Skin: Skin is warm and dry.  Psychiatric: He has a normal mood and affect. Judgment normal.    ED Course  Procedures (including critical care time)  CRITICAL CARE Performed by: Lyanne Co Total critical care time: 35 Critical care time was exclusive of separately billable procedures and treating other patients. Critical care was necessary to treat or prevent imminent or life-threatening deterioration. Critical care was time spent personally by me on the following activities: development of treatment plan with patient and/or surrogate as well as nursing, discussions with consultants, evaluation of patient's response to treatment, examination of patient, obtaining history from patient or surrogate, ordering and performing treatments and interventions, ordering and review of laboratory studies, ordering and review of radiographic studies, pulse oximetry and re-evaluation of patient's condition.   Labs Reviewed  GLUCOSE, CAPILLARY - Abnormal; Notable for the following:    Glucose-Capillary 136 (*)     All other components within normal limits  CBC WITH DIFFERENTIAL  COMPREHENSIVE METABOLIC PANEL  TROPONIN I  BLOOD GAS, ARTERIAL  PRO B NATRIURETIC PEPTIDE  PROTIME-INR  LACTIC ACID, PLASMA  CULTURE, BLOOD (ROUTINE X 2)  CULTURE, BLOOD (ROUTINE X 2)  URINALYSIS, ROUTINE W REFLEX MICROSCOPIC  URINE CULTURE  INFLUENZA PANEL BY PCR   Dg Chest Portable 1 View  02/27/2012  *RADIOLOGY REPORT*  Clinical Data: Shortness of breath and fatigue.  PORTABLE CHEST - 1 VIEW  Comparison: Two-view chest 12/04/2011.  Findings: The heart is mildly enlarged.  Pulmonary vascular congestion and mild edema has increased, somewhat worse right than left.   Underlying airspace disease is also present on the right. The visualized soft tissues and bony thorax are unremarkable.  IMPRESSION:  1.  Borderline cardiomegaly with increasing interstitial edema and pulmonary vascular congestion compatible with congestive heart failure. 2.  Progressive right middle or right lower lobe airspace disease may represent atelectasis, but is worrisome for infection.   Original Report Authenticated By: Marin Roberts, M.D.    I personally reviewed the imaging tests through PACS system I reviewed available ER/hospitalization records through the EMR   1. Respiratory failure   2. Hypoxia   3. CHF (congestive heart failure)   4. HCAP (healthcare-associated pneumonia)   5. Fever     Date: 02/27/2012  Rate: 77  Rhythm: normal sinus rhythm  QRS Axis: normal  Intervals: normal  ST/T Wave abnormalities: Nonspecific lateral ST changes which are unchanged from prior  Narrative Interpretation:   Old EKG Reviewed: No significant changes noted      MDM  Patient evidenced respiratory failure on arrival with oxygen saturations of around 65% on room air.  Patient on 15 L number bradycardia mass probably sat up to around 90% with still increased work of breathing.  The patient was initiated on BiPAP.  This appears to be healthcare associated pneumonia with the majority of his hypoxia likely secondary to CHF exacerbation.  Patient be given IV Lasix at this time.  Oh continue him on his BiPAP.  He will be admitted to the intensive care unit.  Vancomycin and Zosyn now for suspected healthcare associated  pneumonia.  Remainder of labs pending at this time.  Pulmonary critical care to be involved.        Lyanne Co, MD 02/27/12 1435  Lyanne Co, MD 02/27/12 [phone]

## 2012-02-27 NOTE — ED Notes (Signed)
Pt brought in,carried by his son. Son reports a few days of progressive weakness, fatigue and shob. Hx strokes. Pt has slurred speech, believes president is [employer], unable to follow some commands. O2 60% on RA. Pt placed on 15L O2 by NRB. Pt has become more oriented and able to follow more commands. Campos, EDP at bedside.

## 2012-02-27 NOTE — Progress Notes (Signed)
Support for patient's son, Sam, while staff worked with patient. Calming presence; facilitated communication between staff and Sam. Support; presence.

## 2012-02-27 NOTE — ED Notes (Signed)
Attempted to call report to ICU. RN requests call back.

## 2012-02-27 NOTE — Progress Notes (Signed)
eLink Physician-Brief Progress Note Patient Name: Erik Adams DOB: 1945/08/28 MRN: [id number]  Date of Service  02/27/2012   HPI/Events of Note  Request from nurse for DVT propy.  Patient has INR of greater than 2.0.  Plan per admitting MD was to recheck COAGs in AM and administer DVT propy as indicated.   eICU Interventions  Plan: Will place SCDs for now.   Intervention Category Intermediate Interventions: Best-practice therapies (e.g. DVT, beta blocker, etc.)  DETERDING,ELIZABETH 02/27/2012, 11:56 PM

## 2012-02-27 NOTE — ED Notes (Signed)
Will hold levophed at this time due to pt being normotensive.

## 2012-02-27 NOTE — H&P (Signed)
PULMONARY  / CRITICAL CARE MEDICINE  Name: Erik Adams MRN: [id number] DOB: September 27, 1945    LOS: 0  REFERRING MD :  EDP  CHIEF COMPLAINT:  66 h/o chf ef 20% presents Hypoxia/dypena/fever, r/o flu, PNA, chf  LINES / TUBES: 1-16 et>> 1-16 rt cvl>>>  CULTURES: 1-16 bc x 2>> 1-16 uc>> 1-16 sputum>> 1-16 flu panel>>  ANTIBIOTICS: 1-16 vanc>> 1-16 zoysn>> 1-16 tamiflu>> 1/16 levofloxacin>>>  SIGNIFICANT EVENTS:  Intubated in ED  LEVEL OF CARE: icu PRIMARY SERVICE:  pccm CONSULTANTS:   CODE STATUS: full DIET:  npo DVT Px:  Hep GI Px:  ppi  HISTORY OF PRESENT ILLNESS:   66 yo , non smoker , presents with 24 hours of increased sob, fever 101, profoundly hypoxic,and with dense infective process on rt lung via c x r. Requiring 100% bilevel. Recent exposure to flu. He has known EF or 20 % and followed by P. [place]. PCCM asked to evaluate and admit.  PAST MEDICAL HISTORY :  Past Medical History  Diagnosis Date  . CAD (coronary artery disease)     a. s/p BMS to mLAD 2000;  b. LHC 3/12:  sig ISR mLAD, RI 80-90%, Dx1 90% (too small for PCI);  PCI:  ION DES to mLAD, ION DES to RI  . HTN (hypertension)   . Hypercholesteremia   . CVA (cerebral infarction)   . CRI (chronic renal insufficiency)     Baseline creatinine of 1.4 to 1.7  . 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 EF is 45 to 50%;  b. echo 10/13:  inf HK, mod LVH, EF 20%, mild AI, mod MR, mod TR, mod BAE, PASP 46  . Atrial fibrillation 11/13/2011    coumadin and amiodarone; s/p DCCV November 2013; repeat DCCV Feb 13, 2012  . Left atrial thrombus     TEE 11/2011  . Iron deficiency anemia    Past Surgical History  Procedure Date  . Coronary angioplasty with stent placement 2000    BMS to the LAD  . Mastoidectomy   . Coronary angioplasty with stent placement March 2012    DES to LAD and Ramus intermdius  . Tee without cardioversion 12/06/2011    Procedure: TRANSESOPHAGEAL ECHOCARDIOGRAM (TEE);   Surgeon: Pricilla Riffle, MD;  Location: [facility] ENDOSCOPY;  Service: Cardiovascular;  Laterality: N/A;  . Cardioversion 01/06/2012    Procedure: CARDIOVERSION;  Surgeon: Wendall Stade, MD;  Location: [facility] ENDOSCOPY;  Service: Cardiovascular;  Laterality: N/A;  call anesthesia  maybe able to do cardio. early at 12:00  . Cardioversion 02/13/2012    Procedure: CARDIOVERSION;  Surgeon: Cassell Clement, MD;  Location: [facility] ENDOSCOPY;  Service: Cardiovascular;  Laterality: N/A;   Prior to Admission medications   Medication Sig Start Date End Date Taking? Authorizing Provider  amiodarone (PACERONE) 400 MG tablet Take 1 tablet (400 mg total) by mouth daily. 12/07/11   Jessica A Hope, PA-C  amLODipine (NORVASC) 10 MG tablet Take 10 mg by mouth daily.    Historical Provider, MD  aspirin EC 81 MG tablet Take 1 tablet (81 mg total) by mouth daily. 11/13/11   Peter M [place], MD  carvedilol (COREG) 25 MG tablet Take 1 tablet (25 mg total) by mouth 2 (two) times daily with a meal. 12/07/11   Jessica A Hope, PA-C  ferrous sulfate 325 (65 FE) MG tablet Take 1 tablet (325 mg total) by mouth daily with breakfast. 12/07/11   Jessica A Hope, PA-C  furosemide (  LASIX) 40 MG tablet Take 80-120 mg by mouth 2 (two) times daily. Taking 3 pills in the am and 2 pills in the pm 12/27/11   Peter M [place], MD  hydrALAZINE (APRESOLINE) 50 MG tablet Take 1.5 tablets (75 mg total) by mouth 3 (three) times daily. 12/07/11 12/06/12  Jessica A Hope, PA-C  nitroGLYCERIN (NITROSTAT) 0.4 MG SL tablet Place 0.4 mg under the tongue every 5 (five) minutes as needed. For chest pain    Historical Provider, MD  olmesartan (BENICAR) 40 MG tablet Take 40 mg by mouth daily.     Historical Provider, MD  potassium chloride SA (K-DUR,KLOR-CON) 20 MEQ tablet Take 2 tablets (40 mEq total) by mouth 2 (two) times daily. 12/09/11   Peter M [place], MD  rosuvastatin (CRESTOR) 10 MG tablet Take 10 mg by mouth daily.     Historical Provider, MD  warfarin (COUMADIN) 5 MG  tablet Take 5 mg by mouth See admin instructions. Take 1 1/2 tablet (7.5 mg) on Wednesday and take 1 tablet (5 mg) on all other days    Historical Provider, MD   Allergies  Allergen Reactions  . Ace Inhibitors Other (See Comments)    Unknown reaction  . Clonidine Derivatives Other (See Comments)    Unknown reaction    FAMILY HISTORY:  Family History  Problem Relation Age of Onset  . Heart disease Father    SOCIAL HISTORY:  reports that he quit smoking about 38 years ago. He has never used smokeless tobacco. He reports that he does not drink alcohol or use illicit drugs.  REVIEW OF SYSTEMS:  N  INTERVAL HISTORY: intubated  VITAL SIGNS: Temp:  [101.5 F (38.6 C)] 101.5 F (38.6 C) (01/16 1356) Pulse Rate:  [74] 74  (01/16 1419) Resp:  [22-24] 24  (01/16 1419) BP: (142)/(103) 142/103 mmHg (01/16 1419) SpO2:  [94 %-98 %] 98 % (01/16 1419) Weight:  [81.647 kg (180 lb)] 81.647 kg (180 lb) (01/16 1356) HEMODYNAMICS:   VENTILATOR SETTINGS:   INTAKE / OUTPUT: Intake/Output    None     PHYSICAL EXAMINATION: General:  WNWDWF on bipap Neuro:  Slight confusion HEENT:  No LAN Cardiovascular:  HSIR RR Lungs:  Decreased bs bases Abdomen:  Soft + bs Musculoskeletal:  intact Skin:  warm   LABS: Cbc  Lab 02/27/12 1345  WBC 10.8*  HGB 10.4*  HCT 33.6*  PLT 307    Chemistry  No results found for this basename: NA:3,K:3,CL:3,CO2:3,BUN:3,CREATININE:3,CALCIUM:3,MG:3,PHOS:3,GLUCOSE:3 in the last 168 hours  Liver fxn No results found for this basename: AST:3,ALT:3,ALKPHOS:3,BILITOT:3,PROT:3,ALBUMIN:3 in the last 168 hours coags No results found for this basename: APTT:3,INR:3 in the last 168 hours Sepsis markers No results found for this basename: LATICACIDVEN:3,PROCALCITON:3 in the last 168 hours Cardiac markers No results found for this basename: CKTOTAL:3,CKMB:3,TROPONINI:3 in the last 168 hours BNP No results found for this basename: PROBNP:3 in the last 168  hours ABG No results found for this basename: PHART:3,PCO2ART:3,PO2ART:3,HCO3:3,TCO2:3 in the last 168 hours  CBG trend  Lab 02/27/12 1346  GLUCAP 136*    IMAGING: Dg Chest Portable 1 View  02/27/2012  *RADIOLOGY REPORT*  Clinical Data: Shortness of breath and fatigue.  PORTABLE CHEST - 1 VIEW  Comparison: Two-view chest 12/04/2011.  Findings: The heart is mildly enlarged.  Pulmonary vascular congestion and mild edema has increased, somewhat worse right than left.  Underlying airspace disease is also present on the right. The visualized soft tissues and bony thorax are unremarkable.  IMPRESSION:  1.  Borderline cardiomegaly with increasing interstitial edema and pulmonary vascular congestion compatible with congestive heart failure. 2.  Progressive right middle or right lower lobe airspace disease may represent atelectasis, but is worrisome for infection.   Original Report Authenticated By: Marin Roberts, M.D.     ECG: No acute changes  DIAGNOSES: Active Problems:  HYPERTENSION  CAD (coronary artery disease)  HTN (hypertension)  CKD (chronic kidney disease) stage 3, GFR 30-59 ml/min  S/P coronary artery stent placement  Heart failure, acute on chronic, systolic and diastolic  Atrial fibrillation  SOB (shortness of breath)  ARDS (adult respiratory distress syndrome)  Respiratory failure   ASSESSMENT / PLAN:  PULMONARY  ASSESSMENT: Acute resp failure with suspected pna and questionable flu. R/o chf component, likely, r.o ARDS PLAN:   -Intubation -ARDS protocol until we eval cvp -caution significant volume resusciation -BD as needed, limit if ards dx -see flow sheets for abx and antivirals -to goal 7 cc/kg, likely will need to start peep 10  CARDIOVASCULAR ASSESSMENT:  Htn Afib CAD R/o stress ischemia PLAN:  -Hold antihypertensives -place cvl, assess volume with cvp for now -check CE if + heparin drip if inr less 2 + asa -May need cards  consulte echo  RENAL Lab Results  Component Value Date   CREATININE 1.9* 02/18/2012   CREATININE 1.8* 02/11/2012   CREATININE 1.85* 01/03/2012   CREATININE 1.55* 12/07/2011    ASSESSMENT: ARF, r/o ATN CTI with base cret 1.06 PLAN:   -fluids, cvp -chem trend, may need renal US  GASTROINTESTINAL  ASSESSMENT:  No acute issue PLAN:  ppi T start early in sepsis oxepa  HEMATOLOGIC  ASSESSMENT:   On coumadin inr therepetic PLAN:  -use heparin drip if inr less 2 coags in am  cbc  INFECTIOUS  ASSESSMENT:   Presumed pna/ r/o flu PLAN:   -see flows -send sputum -atypical coverage until flu back? isolate  ENDOCRINE  ASSESSMENT:  R/o rel AI PLAN:   Cortisol if drops bp  NEUROLOGIC  ASSESSMENT: No acute issue PLAN:   Avoid benzo May need fent drip   CLINICAL SUMMARY: 67 yo with ARDS pattern and dense rt infiltrate. Intubated for safety. May be + for cardia event  I have personally obtained a history, examined the patient, evaluated laboratory and imaging results, formulated the assessment and plan and placed orders. CRITICAL CARE: The patient is critically ill with multiple organ systems failure and requires high complexity decision making for assessment and support, frequent evaluation and titration of therapies, application of advanced monitoring technologies and extensive interpretation of multiple databases. Critical Care Time devoted to patient care services described in this note is  .    Brett Canales Minor ACNP [facility] PCCM Pager [phone] till 3 pm If no answer page [phone] 02/27/2012, 2:42 PM  I have fully examined this patient and agree with above findings.    And edited in full  Mcarthur Rossetti. Tyson Alias, MD, FACP Pgr: [phone] [facility]

## 2012-02-27 NOTE — Procedures (Signed)
Intubation Procedure Note Erik Adams [id number] [birth year]  Procedure: Intubation Indications: Airway protection and maintenance  Procedure Details Consent: Risks of procedure as well as the alternatives and risks of each were explained to the (patient/caregiver).  Consent for procedure obtained. Time Out: Verified patient identification, verified procedure, site/side was marked, verified correct patient position, special equipment/implants available, medications/allergies/relevent history reviewed, required imaging and test results available.  Performed  3 Medications:  Fentanyl 100 mcg Etomidate 20 mg Versed 2 mg NMB 75 mg roc   Evaluation Hemodynamic Status: BP stable throughout; O2 sats: stable throughout Patient's Current Condition: stable Complications: No apparent complications Patient did tolerate procedure well. Chest X-ray ordered to verify placement.  CXR: pending.   Erik Adams ACNP [facility] PCCM Pager [phone] till 3 pm If no answer page [phone] 02/27/2012, 3:47 PM   Glide Tolerated well  Mcarthur Rossetti. Tyson Alias, MD, FACP Pgr: [phone] [facility]

## 2012-02-28 ENCOUNTER — Inpatient Hospital Stay (HOSPITAL_COMMUNITY): Payer: Medicare Other

## 2012-02-28 DIAGNOSIS — I059 Rheumatic mitral valve disease, unspecified: Secondary | ICD-10-CM

## 2012-02-28 DIAGNOSIS — I5043 Acute on chronic combined systolic (congestive) and diastolic (congestive) heart failure: Secondary | ICD-10-CM

## 2012-02-28 DIAGNOSIS — I5023 Acute on chronic systolic (congestive) heart failure: Secondary | ICD-10-CM

## 2012-02-28 LAB — BLOOD GAS, ARTERIAL
Bicarbonate: 27.3 mEq/L — ABNORMAL HIGH (ref 20.0–24.0)
PEEP: 5 cmH2O
Patient temperature: 99.8
TCO2: 24.4 mmol/L (ref 0–100)
pH, Arterial: 7.466 — ABNORMAL HIGH (ref 7.350–7.450)
pO2, Arterial: 72.9 mmHg — ABNORMAL LOW (ref 80.0–100.0)

## 2012-02-28 LAB — BASIC METABOLIC PANEL
BUN: 26 mg/dL — ABNORMAL HIGH (ref 6–23)
Calcium: 8.2 mg/dL — ABNORMAL LOW (ref 8.4–10.5)
Chloride: 102 mEq/L (ref 96–112)
GFR calc Af Amer: 45 mL/min — ABNORMAL LOW (ref >90)
GFR calc Af Amer: 48 mL/min — ABNORMAL LOW (ref >90)
GFR calc non Af Amer: 39 mL/min — ABNORMAL LOW (ref >90)
GFR calc non Af Amer: 41 mL/min — ABNORMAL LOW (ref >90)
Glucose, Bld: 95 mg/dL (ref 70–99)
Potassium: 2.7 mEq/L — CL (ref 3.5–5.1)
Potassium: 3.1 mEq/L — ABNORMAL LOW (ref 3.5–5.1)
Sodium: 138 mEq/L (ref 135–145)
Sodium: 139 mEq/L (ref 135–145)

## 2012-02-28 LAB — INFLUENZA PANEL BY PCR (TYPE A & B)
Influenza A By PCR: NEGATIVE
Influenza B By PCR: NEGATIVE

## 2012-02-28 LAB — LEGIONELLA ANTIGEN, URINE: Legionella Antigen, Urine: NEGATIVE

## 2012-02-28 LAB — TROPONIN I
Troponin I: 4.08 ng/mL (ref <0.30)
Troponin I: 3.06 ng/mL (ref <0.30)
Troponin I: 2.64 ng/mL (ref <0.30)

## 2012-02-28 LAB — CBC
MCH: 24.6 pg — ABNORMAL LOW (ref 26.0–34.0)
MCHC: 31.1 g/dL (ref 30.0–36.0)
RDW: 16.7 % — ABNORMAL HIGH (ref 11.5–15.5)

## 2012-02-28 LAB — PROTIME-INR: INR: 2.59 — ABNORMAL HIGH (ref 0.00–1.49)

## 2012-02-28 LAB — HEPARIN LEVEL (UNFRACTIONATED): Heparin Unfractionated: 0.12 IU/mL — ABNORMAL LOW (ref 0.30–0.70)

## 2012-02-28 MED ORDER — ASPIRIN 325 MG PO TABS: 325.0000 mg | ORAL_TABLET | Freq: Every day | ORAL | Status: DC

## 2012-02-28 MED ORDER — FUROSEMIDE 10 MG/ML IJ SOLN
80.0000 mg | Freq: Three times a day (TID) | INTRAMUSCULAR | Status: DC
  Administered 2012-02-28 – 2012-02-29 (×2): 80 mg via INTRAVENOUS
  Filled 2012-02-28 (×5): qty 8

## 2012-02-28 MED ORDER — BIOTENE DRY MOUTH MT LIQD
15.0000 mL | Freq: Four times a day (QID) | OROMUCOSAL | Status: DC
  Administered 2012-02-28 – 2012-03-03 (×11): 15 mL via OROMUCOSAL

## 2012-02-28 MED ORDER — CARVEDILOL 25 MG PO TABS
25.0000 mg | ORAL_TABLET | Freq: Two times a day (BID) | ORAL | Status: DC
  Administered 2012-02-28 (×2): 25 mg
  Filled 2012-02-28 (×3): qty 1

## 2012-02-28 MED ORDER — ATORVASTATIN CALCIUM 20 MG PO TABS
20.0000 mg | ORAL_TABLET | Freq: Every day | ORAL | Status: DC
  Administered 2012-02-28 – 2012-03-02 (×4): 20 mg
  Filled 2012-02-28 (×5): qty 1

## 2012-02-28 MED ORDER — AMIODARONE HCL 200 MG PO TABS
400.0000 mg | ORAL_TABLET | Freq: Every day | ORAL | Status: DC
  Administered 2012-02-28: 400 mg
  Filled 2012-02-28 (×2): qty 2

## 2012-02-28 MED ORDER — CARVEDILOL 12.5 MG PO TABS
12.5000 mg | ORAL_TABLET | Freq: Two times a day (BID) | ORAL | Status: DC
  Administered 2012-02-29: 12.5 mg
  Filled 2012-02-28 (×4): qty 1

## 2012-02-28 MED ORDER — HYDRALAZINE HCL 50 MG PO TABS
50.0000 mg | ORAL_TABLET | Freq: Three times a day (TID) | ORAL | Status: DC
  Administered 2012-02-28 – 2012-03-03 (×12): 50 mg via ORAL
  Filled 2012-02-28 (×14): qty 1

## 2012-02-28 MED ORDER — OXEPA PO LIQD
1000.0000 mL | ORAL | Status: DC
  Administered 2012-02-28 – 2012-02-29 (×2): 1000 mL
  Filled 2012-02-28 (×2): qty 1000

## 2012-02-28 MED ORDER — METOPROLOL TARTRATE 1 MG/ML IV SOLN
5.0000 mg | Freq: Three times a day (TID) | INTRAVENOUS | Status: DC
  Administered 2012-02-28: 5 mg via INTRAVENOUS
  Filled 2012-02-28: qty 5

## 2012-02-28 MED ORDER — POTASSIUM CHLORIDE 20 MEQ/15ML (10%) PO LIQD
40.0000 meq | ORAL | Status: AC
  Administered 2012-02-28 (×2): 40 meq
  Filled 2012-02-28 (×2): qty 30

## 2012-02-28 MED ORDER — ASPIRIN 325 MG PO TABS
325.0000 mg | ORAL_TABLET | Freq: Every day | ORAL | Status: DC
  Filled 2012-02-28: qty 1

## 2012-02-28 MED ORDER — HEPARIN (PORCINE) IN NACL 100-0.45 UNIT/ML-% IJ SOLN
1000.0000 [IU]/h | INTRAMUSCULAR | Status: DC
  Administered 2012-02-28: 1000 [IU]/h via INTRAVENOUS
  Filled 2012-02-28: qty 250

## 2012-02-28 MED ORDER — CARVEDILOL 25 MG PO TABS
25.0000 mg | ORAL_TABLET | Freq: Two times a day (BID) | ORAL | Status: DC
  Filled 2012-02-28 (×2): qty 1

## 2012-02-28 MED ORDER — ISOSORBIDE DINITRATE 20 MG PO TABS
20.0000 mg | ORAL_TABLET | Freq: Three times a day (TID) | ORAL | Status: DC
  Administered 2012-02-28 – 2012-03-03 (×12): 20 mg via ORAL
  Filled 2012-02-28 (×14): qty 1

## 2012-02-28 MED ORDER — HEPARIN (PORCINE) IN NACL 100-0.45 UNIT/ML-% IJ SOLN
800.0000 [IU]/h | INTRAMUSCULAR | Status: DC
  Administered 2012-02-28: 800 [IU]/h via INTRAVENOUS
  Filled 2012-02-28: qty 250

## 2012-02-28 MED ORDER — AMIODARONE HCL 200 MG PO TABS
400.0000 mg | ORAL_TABLET | Freq: Every day | ORAL | Status: DC
  Filled 2012-02-28: qty 2

## 2012-02-28 MED ORDER — POTASSIUM CHLORIDE 10 MEQ/50ML IV SOLN
10.0000 meq | INTRAVENOUS | Status: AC
  Administered 2012-02-28 (×4): 10 meq via INTRAVENOUS
  Filled 2012-02-28: qty 200

## 2012-02-28 MED ORDER — VANCOMYCIN HCL 1000 MG IV SOLR
750.0000 mg | Freq: Two times a day (BID) | INTRAVENOUS | Status: DC
  Administered 2012-02-28 – 2012-02-29 (×3): 750 mg via INTRAVENOUS
  Filled 2012-02-28 (×3): qty 750

## 2012-02-28 MED ORDER — FUROSEMIDE 10 MG/ML IJ SOLN
80.0000 mg | Freq: Two times a day (BID) | INTRAMUSCULAR | Status: DC
  Administered 2012-02-28: 80 mg via INTRAVENOUS
  Filled 2012-02-28 (×2): qty 8

## 2012-02-28 MED ORDER — CHLORHEXIDINE GLUCONATE 0.12 % MT SOLN
15.0000 mL | Freq: Two times a day (BID) | OROMUCOSAL | Status: DC
  Administered 2012-02-28 – 2012-03-03 (×9): 15 mL via OROMUCOSAL
  Filled 2012-02-28 (×12): qty 15

## 2012-02-28 MED ORDER — ASPIRIN 81 MG PO CHEW
81.0000 mg | CHEWABLE_TABLET | Freq: Every day | ORAL | Status: DC
  Administered 2012-02-29 – 2012-03-03 (×4): 81 mg via ORAL
  Filled 2012-02-28 (×4): qty 1

## 2012-02-28 NOTE — Progress Notes (Signed)
PULMONARY  / CRITICAL CARE MEDICINE  Name: Erik Adams MRN: [id number] DOB: 12/13/1945    LOS: 1  REFERRING MD :  EDP  PATIENT SUMMARY:  39 h/o cad/chf ef 20% presents Hypoxia/dyspena/fever, RML > L sided infiltrates, VDRF. Positive troponin, started on heparin, ASA, b-blockade 1/16.   LINES / TUBES: 1-16 et>> 1-16 rt cvl>>>  CULTURES: 1-16 bc x 2>> 1-16 uc>> 1-16 sputum>> 1-16 flu panel>>  ANTIBIOTICS: 1-16 vanc>> 1-16 zoysn>> 1/16 1/16 ceftriaxone >>  1-16 tamiflu>> 1/16 levofloxacin>>>  SIGNIFICANT EVENTS:   1/16 Intubated in ED 1/16 positive troponin, heparin started 1/16 TTE >>   LEVEL OF CARE: icu PRIMARY SERVICE:  pccm CONSULTANTS:   CODE STATUS: full DIET:  npo DVT Px:  Hep GI Px:  ppi  HISTORY OF PRESENT ILLNESS:   67 yo , non smoker , presents with 24 hours of increased sob, fever 101, profoundly hypoxic,and with dense infective process on rt lung via c x r. Requiring 100% bilevel. Recent exposure to flu. He has known EF or 20 % and followed by P. [place]. PCCM asked to evaluate and admit.  INTERVAL HISTORY:  Heparin started 1/16 for troponin positive  VITAL SIGNS: Temp:  [98.8 F (37.1 C)-101.5 F (38.6 C)] 98.8 F (37.1 C) (01/17 0000) Pulse Rate:  [58-81] 63  (01/17 1000) Resp:  [13-26] 14  (01/17 1000) BP: (125-161)/(68-103) 145/82 mmHg (01/17 1000) SpO2:  [94 %-100 %] 95 % (01/17 1000) FiO2 (%):  [40 %-100 %] 40 % (01/17 0832) Weight:  [79.7 kg (175 lb 11.3 oz)-81.647 kg (180 lb)] 79.7 kg (175 lb 11.3 oz) (01/16 2045) HEMODYNAMICS: CVP:  [9 mmHg-11 mmHg] 11 mmHg VENTILATOR SETTINGS: Vent Mode:  [-] CPAP FiO2 (%):  [40 %-100 %] 40 % Set Rate:  [14 bmp-26 bmp] 14 bmp Vt Set:  [440 mL-580 mL] 550 mL PEEP:  [5 cmH20-10 cmH20] 5 cmH20 Pressure Support:  [5 cmH20] 5 cmH20 Plateau Pressure:  [15 cmH20-24 cmH20] 16 cmH20 INTAKE / OUTPUT: Intake/Output      01/16 0701 - 01/17 0700 01/17 0701 - 01/18 0700   I.V. (mL/kg) 640 (8) 224 (2.8)     NG/GT 280 50   IV Piggyback 10    Total Intake(mL/kg) 930 (11.7) 274 (3.4)   Urine (mL/kg/hr) 1945 (1) 200   Total Output 1945 200   Net -1015 +74          PHYSICAL EXAMINATION: General:  WNWDWF on bipap Neuro:  Slight confusion HEENT:  No LAN Cardiovascular:  HSIR RR Lungs:  Decreased bs bases Abdomen:  Soft + bs Musculoskeletal:  intact Skin:  warm  LABS: Cbc  Lab 02/28/12 0425 02/27/12 1345  WBC 7.0 --  HGB 9.0* 10.4*  HCT 28.9* 33.6*  PLT 197 307    Chemistry  Lab 02/28/12 0425 02/27/12 1630 02/27/12 1345  NA 138 -- 137  K 2.7* -- 4.0  CL 101 -- 97  CO2 29 -- 24  BUN 26* -- 28*  CREATININE 1.73* -- 2.02*  CALCIUM 8.2* -- 9.3  MG -- 2.1 --  PHOS -- 3.9 --  GLUCOSE 95 -- 138*    Liver fxn  Lab 02/27/12 1345  AST 36  ALT 33  ALKPHOS 93  BILITOT 1.0  PROT 6.6  ALBUMIN 3.4*   coags  Lab 02/27/12 1630 02/27/12 1345  APTT 48* --  INR -- 2.46*   Sepsis markers  Lab 02/27/12 1630 02/27/12 1430  LATICACIDVEN -- 1.5  PROCALCITON 0.51 --  Cardiac markers  Lab 02/28/12 0425 02/27/12 2200 02/27/12 1345  CKTOTAL -- -- --  CKMB -- -- --  TROPONINI 5.18* 4.37* 0.37*   BNP  Lab 02/27/12 1345  PROBNP 18223.0*   ABG  Lab 02/28/12 0358 02/27/12 2318 02/27/12 1550  PHART 7.466* 7.459* 7.474*  PCO2ART 38.7 37.8 32.8*  PO2ART 72.9* 74.8* 133.0*  HCO3 27.3* 26.3* 23.8  TCO2 24.4 24.3 21.9    CBG trend  Lab 02/27/12 1346  GLUCAP 136*    IMAGING: Portable Chest Xray In Am  02/28/2012  *RADIOLOGY REPORT*  Clinical Data: ARDS.  PORTABLE CHEST - 1 VIEW  Comparison: 02/27/2012  Findings: Endotracheal tube tip measures 5.4 cm above the carina. Enteric tube tip is not visible but is below the left hemidiaphragm consistent with location or distal to the stomach.  Right central venous catheter with tip  over the upper SVC region.  Cardiac enlargement with pulmonary vascular congestion and perihilar infiltration suggesting edema or ARDS.  Probable  small pleural effusions.  No pneumothorax. No definite change since previous study.  IMPRESSION: Appliances appear in satisfactory position.  Cardiac enlargement with pulmonary vascular congestion and small bilateral pleural effusions.  Bilateral perihilar infiltration may represent edema or ARDS.  No significant change.   Original Report Authenticated By: Burman Nieves, M.D.    Dg Chest Portable 1 View  02/27/2012  *RADIOLOGY REPORT*  Clinical Data: Shortness of breath and fatigue.  PORTABLE CHEST - 1 VIEW  Comparison: Two-view chest 12/04/2011.  Findings: The heart is mildly enlarged.  Pulmonary vascular congestion and mild edema has increased, somewhat worse right than left.  Underlying airspace disease is also present on the right. The visualized soft tissues and bony thorax are unremarkable.  IMPRESSION:  1.  Borderline cardiomegaly with increasing interstitial edema and pulmonary vascular congestion compatible with congestive heart failure. 2.  Progressive right middle or right lower lobe airspace disease may represent atelectasis, but is worrisome for infection.   Original Report Authenticated By: Marin Roberts, M.D.    Dg Chest Port 1v Same Day  02/27/2012  *RADIOLOGY REPORT*  Clinical Data: Right IJ line placement.  PORTABLE CHEST - 1 VIEW SAME DAY  Comparison: 02/27/2012  Findings: Cardiomegaly with bilateral airspace opacities, most compatible with edema.  This appears to worsened slightly since prior study.  Endotracheal tube is 6 cm above the carina.  Right central line tip is in the SVC.  No pneumothorax.  No definite effusions.  IMPRESSION: Support devices as above.  No pneumothorax.  Mild worsening in bilateral airspace disease, presumably worsening edema/CHF.   Original Report Authenticated By: Charlett Nose, M.D.     ECG: No acute changes  DIAGNOSES: Active Problems:  HYPERTENSION  CAD (coronary artery disease)  HTN (hypertension)  CKD (chronic kidney disease) stage 3, GFR  30-59 ml/min  S/P coronary artery stent placement  Heart failure, acute on chronic, systolic and diastolic  Atrial fibrillation  SOB (shortness of breath)  ARDS (adult respiratory distress syndrome)  Respiratory failure   ASSESSMENT / PLAN:  PULMONARY  ASSESSMENT: Acute resp failure with suspected RML PNA vs cardiogenic edema, and questionable flu.  Consider ARDS PLAN:   -ARDS protocol initiated, has quickly progressed to PSV 5/PEEP5 on 1/17 -consider extubation 1/17 but B infiltrates persist, do not believe he would tolerate long -careful with volume given hx CHF, contribution to this presentation -see flow sheets for abx and antivirals  CARDIOVASCULAR ASSESSMENT:  Htn Afib CAD, s/p Acute NSTEMI with troponin still climbing CHF -  suspect acute pulmonary edema (vs ARDS) given low Pct and absence leukocytosis PLAN:  - heparin gtt + ASA - diuresis as he can tolerate, restart lasix and follow renal fxn - restart amiodarone coreg, stop IV metoprolol - follow TTE results - will ask cardiology to see  RENAL Lab Results  Component Value Date   CREATININE 1.73* 02/28/2012   CREATININE 2.02* 02/27/2012   CREATININE 1.9* 02/18/2012   CREATININE 1.85* 01/03/2012    ASSESSMENT: ARF, likely ATN CTI with base cret 1.06 PLAN:   -fluids, cvp -follow renal panel and UOP  GASTROINTESTINAL  ASSESSMENT:  No acute issue PLAN:  ppi oxepa  HEMATOLOGIC  ASSESSMENT:   Coumadin use for A Fib PLAN:  -use heparin drip -follow CBC and INR  INFECTIOUS  ASSESSMENT:   Possible CAP, Possible Flu with pneumonitis PLAN:   -abx per flowsheet -tamiflu until Flu panel returns  ENDOCRINE  ASSESSMENT: PLAN:   Cortisol if drops bp  NEUROLOGIC  ASSESSMENT: Sedation PLAN:   - intermittent sedation protocol   CLINICAL SUMMARY: 67 yo hx CAD, CHF, admitted with B infiltrates focal in RML, possible PNA vs CHF. ? Flu with pneumonitis/ARDS. VDRF. Acute NSTEMI.   I have personally  obtained a history, examined the patient, evaluated laboratory and imaging results, formulated the assessment and plan and placed orders.  CRITICAL CARE: The patient is critically ill with multiple organ systems failure and requires high complexity decision making for assessment and support, frequent evaluation and titration of therapies, application of advanced monitoring technologies and extensive interpretation of multiple databases. Critical Care Time devoted to patient care services described in this note is 45 minutes.   Levy Pupa, MD, PhD 02/28/2012, 10:53 AM [facility] [phone] or if no answer [phone]

## 2012-02-28 NOTE — Progress Notes (Signed)
Echocardiogram 2D Echocardiogram has been performed.  Erik Adams 02/28/2012, 9:17 AM

## 2012-02-28 NOTE — Progress Notes (Signed)
ANTICOAGULATION CONSULT NOTE - Initial Consult  Pharmacy Consult for IV Heparin Indication: Elevated Troponin (4.37) in pt with history of CAD/Stents  Allergies  Allergen Reactions  . Ace Inhibitors Other (See Comments)    Unknown reaction  . Clonidine Derivatives Other (See Comments)    Unknown reaction    Patient Measurements: Height: 5\' 10"  (177.8 cm) Weight: 175 lb 11.3 oz (79.7 kg) IBW/kg (Calculated) : 73  Heparin Dosing Weight: 73  Vital Signs: Temp: 98.8 F (37.1 C) (01/17 0000) Temp src: Oral (01/17 0000) BP: 141/74 mmHg (01/17 0000) Pulse Rate: 64  (01/17 0000)  Labs:  Basename 02/27/12 2200 02/27/12 1630 02/27/12 1345  HGB -- -- 10.4*  HCT -- -- 33.6*  PLT -- -- 307  APTT -- 48* --  LABPROT -- -- 25.5*  INR -- -- 2.46*  HEPARINUNFRC -- -- --  CREATININE -- -- 2.02*  CKTOTAL -- -- --  CKMB -- -- --  TROPONINI 4.37* -- 0.37*    Estimated Creatinine Clearance: 36.6 ml/min (by C-G formula based on Cr of 2.02).   Medical History: Past Medical History  Diagnosis Date  . CAD (coronary artery disease)     a. s/p BMS to mLAD 2000;  b. LHC 3/12:  sig ISR mLAD, RI 80-90%, Dx1 90% (too small for PCI);  PCI:  ION DES to mLAD, ION DES to RI  . HTN (hypertension)   . Hypercholesteremia   . CVA (cerebral infarction)   . CRI (chronic renal insufficiency)     Baseline creatinine of 1.4 to 1.7  . 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 EF is 45 to 50%;  b. echo 10/13:  inf HK, mod LVH, EF 20%, mild AI, mod MR, mod TR, mod BAE, PASP 46  . Atrial fibrillation 11/13/2011    coumadin and amiodarone; s/p DCCV November 2013; repeat DCCV Feb 13, 2012  . Left atrial thrombus     TEE 11/2011  . Iron deficiency anemia     Medications:  Scheduled:    . antiseptic oral rinse  15 mL Mouth Rinse QID  . aspirin  300 mg Rectal Daily  . [COMPLETED] cefTRIAXone (ROCEPHIN)  IV  1 g Intravenous Once  . cefTRIAXone (ROCEPHIN)  IV  1 g Intravenous Q24H  .  chlorhexidine  15 mL Mouth Rinse BID  . [COMPLETED] etomidate  0.3 mg/kg Intravenous Once  . [COMPLETED] fentaNYL  100 mcg Intravenous Once  . [COMPLETED] furosemide  80 mg Intravenous Once  . levofloxacin (LEVAQUIN) IV  750 mg Intravenous Q48H  . lidocaine (cardiac) 100 mg/84ml      . [COMPLETED] midazolam      . [COMPLETED] midazolam  2 mg Intravenous Once  . midazolam  2 mg Intravenous Once  . oseltamivir  150 mg Oral BID  . pantoprazole (PROTONIX) IV  40 mg Intravenous QHS  . [COMPLETED] piperacillin-tazobactam (ZOSYN)  IV  3.375 g Intravenous Once  . [COMPLETED] rocuronium      . rocuronium  75 mg Intravenous Once  . [COMPLETED] sodium chloride  250 mL Intravenous Once  . succinylcholine      . [COMPLETED] vancomycin  1,000 mg Intravenous Once  . vancomycin  1,000 mg Intravenous Q24H  . [DISCONTINUED] cefTRIAXone (ROCEPHIN) IM  1 g Intramuscular Q24H  . [DISCONTINUED] heparin subcutaneous  5,000 Units Subcutaneous Q8H  . [DISCONTINUED] oseltamivir  150 mg Oral BID  . [DISCONTINUED] oseltamivir  75 mg Oral Once  . [DISCONTINUED] piperacillin-tazobactam (  ZOSYN)  IV  3.375 g Intravenous Once   Infusions:    . sodium chloride 50 mL/hr at 02/27/12 1630  . feeding supplement (OXEPA) 1,000 mL (02/28/12 0030)  . fentaNYL infusion INTRAVENOUS    . norepinephrine (LEVOPHED) Adult infusion      Assessment: 67 yo admitted with respiratory failure, PNA/Flu with history of CAD/Stents now with elevated troponin.  Pt on Warfarin outpatient for A-fib.  Last INR = 2.46.  MD aware elevated INR wants to start IV Heparin. Goal of Therapy:  Heparin level 0.3-0.7 units/ml Monitor platelets by anticoagulation protocol: Yes   Plan:   Start heparin drip @ 800 units/hr.  No bolus.  Check daily CBC/heparin level.  Check 1st HL in 6 hours.  Susanne Greenhouse R 02/28/2012,1:46 AM

## 2012-02-28 NOTE — Progress Notes (Signed)
eLink Physician-Brief Progress Note Patient Name: Erik Adams DOB: January 20, 1946 MRN: [id number]  Date of Service  02/28/2012   HPI/Events of Note  Bleeding in urine on heparin drip  eICU Interventions  D/c heparin drip, start SCDs Await cardiology evaluation   Intervention Category Intermediate Interventions: Bleeding - evaluation and treatment with blood products  Shan Levans 02/28/2012, 4:45 PM

## 2012-02-28 NOTE — Progress Notes (Signed)
eLink Physician-Brief Progress Note Patient Name: Erik Adams DOB: 26-Dec-1945 MRN: [id number]  Date of Service  02/28/2012   HPI/Events of Note  Patient with h/o of CAD/stents admitted with resp failure concern for PNA/flu syndrome.  Now with elevations in trop to greater than 4.  Started on ASA and ECHO ordered for AM.   eICU Interventions  Plan: Given increase in trop will start heparin gtt per pharmacy.  INR is already out to greater than 2.0 as paitent with AF on outpatient anticoagulation.   Intervention Category Intermediate Interventions: Diagnostic test evaluation  DETERDING,ELIZABETH 02/28/2012, 1:39 AM

## 2012-02-28 NOTE — Progress Notes (Signed)
eLink Physician-Brief Progress Note Patient Name: Erik Adams DOB: [birth year] MRN: [id number]  Date of Service  02/28/2012   HPI/Events of Note   K low  eICU Interventions  IV KCL   Intervention Category Major Interventions: Electrolyte abnormality - evaluation and management  Shan Levans 02/28/2012, 6:21 PM

## 2012-02-28 NOTE — Progress Notes (Signed)
eLink Physician-Brief Progress Note Patient Name: Erik Adams DOB: 1945-08-28 MRN: [id number]  Date of Service  02/28/2012   HPI/Events of Note   hypokalemia  eICU Interventions  Potassium replaced   Intervention Category Intermediate Interventions: Electrolyte abnormality - evaluation and management  Anaise Sterbenz 02/28/2012, 5:39 AM

## 2012-02-28 NOTE — Progress Notes (Signed)
ANTIBIOTIC CONSULT NOTE - FOLLOW UP  Pharmacy Consult for Vancomycin, Levaquin Indication: r/o HCAP  Allergies  Allergen Reactions  . Ace Inhibitors Other (See Comments)    Unknown reaction  . Clonidine Derivatives Other (See Comments)    Unknown reaction    Patient Measurements: Height: 5\' 10"  (177.8 cm) Weight: 175 lb 11.3 oz (79.7 kg) IBW/kg (Calculated) : 73   Labs:  Basename 02/28/12 0425 02/27/12 1345  WBC 7.0 10.8*  HGB 9.0* 10.4*  PLT 197 307  LABCREA -- --  CREATININE 1.73* 2.02*    Assessment:  9 yom with refractory afib and CHF s/p successful cardioversion 02/13/12 presented 1/16 with c/o weakness, fatigue, SOB. Found febrile, CXR worrisome for infection and O2 Sat = 60%, required intubation in the ED. Pt with recent exposure to the flu (family member).  Today is day #2 of Vanc, Levaquin, Rocephin for suspected HCAP.  Verified with NP (no additional pseudomonal coverage needed at this time).  Today is D1/5 Tamiflu.  Patient is afebrile since this AM, Tmax of 101.5 yesterday.  Scr improving for CG CrCl 43 ml/min (same as normalized CrCl).  PCT was 0.51 on 1/16 and lactic acid wnl.    Cultures are as follow ...   1/16 blood x 2 >> pending 1/16 urine >> pending 1/16 mrsa pcr >> negative 1/16 influenza panel >> in process 1/16 strep pneumo/legionella ag >> negative/pending 1/17 sputum >> pending   Goal of Therapy:  Vancomycin trough level 15-20 mcg/ml Renal adjustment of antibiotics  Plan:   Change Vancomycin to 750 mg IV q12h  Continue Zosyn and Rocephin as ordered  Pharmacy will f/u  Geoffry Paradise, PharmD, BCPS Pager: [phone] 9:07 AM Pharmacy #: [id number]

## 2012-02-28 NOTE — Consult Note (Signed)
CARDIOLOGY CONSULT NOTE  Patient ID: Erik Adams MRN: [id number] DOB/AGE: October 13, 1945 67 y.o.  Admit date: 02/27/2012  Primary Cardiologist: [place] Reason for Consultation: CHF, elevated troponin  HPI:  67 yo with history of CAD, ischemic cardiomyopathy with chronic systolic CHF, paroxysmal atrial fibrillation, and CKD presented to [facility] on 1/16 with increasing dyspnea.  He was found to be febrile to 101.5 and hypoxic. He was intubated.  CXR shows pulmonary edema +/- possible infiltrate.  Influenza test was negative.  He has been treated for PNA with antibiotics and has been started on IV Lasix for diuresis.  Troponin was noted to be elevated and he was started on heparin gtt initially but this was stopped due to hematuria (INR is actually therapeutic).  ECG showed no change from the prior ECG.   Patient is awake/alert this afternoon and still intubated.  He indicates that he was short of breath when he came to the ER but had not had chest pain.  He remains in NSR. He has been started on IV Lasix with good diuresis so far today.  His CVP currently is 10-11 (measured just now).   Review of systems complete and found to be negative unless listed above in HPI  Past Medical History: 1. Atrial fibrillation: Paroxysmal.  DCCV to NSR in 1/14, on amiodarone. Has h/o LAA thrombus (seen on TEE 10/13).  2. Chronic systolic CHF: Ischemic cardiomyopathy.  Most recent echo (1/14) with EF 25%, restrictive diastolic function, diffuse hypokinesis, normal RV size and systolic function, moderate MR, PA systoilc pressure 52-57 mmHg.  3. CAD: BMS to mLAD in 2000.  DES to mLAD and DES to ramus in 2012.  4. HTN 5. H/o CVA 6. Fe deficiency anemia 7. CKD: Most recent creatinine prior to hospitalization was 1.9.   Family History  Problem Relation Age of Onset  . Heart disease Father     History   Social History  . Marital Status: Widowed    Spouse Name: N/A    Number of Children: 5  . Years of  Education: N/A   Occupational History  . insurance     Social History Main Topics  . Smoking status: Former Smoker    Types: Cigarettes    Quit date: 02/11/1974  . Smokeless tobacco: Never Used  . Alcohol Use: No  . Drug Use: No  . Sexually Active: Not Currently   Other Topics Concern  . Not on file   Social History Narrative  . No narrative on file     Prescriptions prior to admission  Medication Sig Dispense Refill  . amiodarone (PACERONE) 400 MG tablet Take 1 tablet (400 mg total) by mouth daily.  30 tablet  6  . amLODipine (NORVASC) 10 MG tablet Take 10 mg by mouth daily.      Marland Kitchen aspirin EC 81 MG tablet Take 1 tablet (81 mg total) by mouth daily.  90 tablet  3  . carvedilol (COREG) 25 MG tablet Take 1 tablet (25 mg total) by mouth 2 (two) times daily with a meal.  60 tablet  6  . ferrous sulfate 325 (65 FE) MG tablet Take 1 tablet (325 mg total) by mouth daily with breakfast.  30 tablet  1  . furosemide (LASIX) 40 MG tablet Take 80-120 mg by mouth 2 (two) times daily. Taking 3 pills in the am and 2 pills in the pm      . hydrALAZINE (APRESOLINE) 50 MG tablet Take 1.5 tablets (75  mg total) by mouth 3 (three) times daily.  180 tablet  6  . nitroGLYCERIN (NITROSTAT) 0.4 MG SL tablet Place 0.4 mg under the tongue every 5 (five) minutes as needed. For chest pain      . olmesartan (BENICAR) 40 MG tablet Take 40 mg by mouth daily.       . potassium chloride SA (K-DUR,KLOR-CON) 20 MEQ tablet Take 2 tablets (40 mEq total) by mouth 2 (two) times daily.  180 tablet  6  . rosuvastatin (CRESTOR) 10 MG tablet Take 10 mg by mouth daily.       Marland Kitchen warfarin (COUMADIN) 5 MG tablet Take 5 mg by mouth See admin instructions. Take 1 1/2 tablet (7.5 mg) on Wednesday and take 1 tablet (5 mg) on all other days         Current Medications . amiodarone  400 mg Per Tube Daily  . antiseptic oral rinse  15 mL Mouth Rinse QID  . aspirin  325 mg Per Tube Daily  . carvedilol  25 mg Per Tube BID WC  .  cefTRIAXone (ROCEPHIN)  IV  1 g Intravenous Q24H  . chlorhexidine  15 mL Mouth Rinse BID  . furosemide  80 mg Intravenous BID  . levofloxacin (LEVAQUIN) IV  750 mg Intravenous Q48H  . midazolam  2 mg Intravenous Once  . oseltamivir  150 mg Oral BID  . pantoprazole (PROTONIX) IV  40 mg Intravenous QHS  . rocuronium  75 mg Intravenous Once  . vancomycin  750 mg Intravenous Q12H    Physical exam Blood pressure 140/73, pulse 59, temperature 98.8 F (37.1 C), temperature source Oral, resp. rate 15, height 5\' 10"  (1.778 m), weight 175 lb 11.3 oz (79.7 kg), SpO2 100.00%. General: NAD Neck: No JVD, no thyromegaly or thyroid nodule.  Lungs: Clear to auscultation bilaterally with normal respiratory effort. CV: Nondisplaced PMI.  Heart regular S1/S2, no S3/S4, no murmur.  No peripheral edema.  No carotid bruit.  Normal pedal pulses.  Abdomen: Soft, nontender, no hepatosplenomegaly, no distention.  Skin: Intact without lesions or rashes.  Neurologic: Alert and oriented x 3.  Psych: Normal affect. Extremities: No clubbing or cyanosis.  HEENT: Normal.   Labs:   Lab Results  Component Value Date   WBC 7.0 02/28/2012   HGB 9.0* 02/28/2012   HCT 28.9* 02/28/2012   MCV 79.0 02/28/2012   PLT 197 02/28/2012    Lab 02/28/12 0425 02/27/12 1345  NA 138 --  K 2.7* --  CL 101 --  CO2 29 --  BUN 26* --  CREATININE 1.73* --  CALCIUM 8.2* --  PROT -- 6.6  BILITOT -- 1.0  ALKPHOS -- 93  ALT -- 33  AST -- 36  GLUCOSE 95 --   TnI 0.37 > 4.37 > 5.2 > 4 BNP 18,223  INR 2.59   Radiology: - CXR with bilateral vascular congestion and pulmonary edema.  EKG: NSR, LVH with anterolateral ST depression.  Similar to prior ECG (no acute changes).   ASSESSMENT AND PLAN:  67 yo with history of CAD, ischemic cardiomyopathy with chronic systolic CHF, paroxysmal atrial fibrillation, and CKD presented to [facility] [place] on 1/16 with increasing dyspnea.  He was found to be febrile to 101.5 and hypoxic. He was  intubated.  1. Pulmonary: Given fever, He certainly may have PNA.  Influenza test was negative.  PCT was elevated but was not markedly high.  He is volume overloaded on exam and I think that pulmonary edema does  play a major role in his presentation.  Suspect he had some pre-existing volume overload and a pulmonary infection tipped him over into decompensation.  - Continue antibiotics as per CCM.  - Continue diuresis as below.  2. Acute on chronic systolic CHF: Patient is volume overloaded on exam.  CVP 10-11.  Pulmonary edema on CXR.  Very high BNP.   - Increase Lasix to 80 mg IV every 8 hours - hydralazine 50 mg every 8 hrs and isordil 20 mg every 8 hours for afterload reduction.  - Hold olmesartan given CKD and ongoing diuresis. - Cut Coreg in half to 12.5 mg bid with acute pulmonary edema.  - BP is normal to high.  If he does not diurese well or creatinine rises suggestive of a cardiorenal presentation, could start a nesiritide gtt for afterload reduction.  3. Paroxysmal atrial fibrillation: He remains in NSR.  Continue amiodarone.  4. CAD: No chest pain but troponin was elevated.  No change on ECG when compared to most recent office ECG.  It is possible that this could represent demand ischemia in the setting of acute systolic CHF and CKD.  Troponin is trending down.  Echo showed EF 25% and global hypokinesis, which is consistent with past echoes.  For now, treat with ASA and restart his statin.  He was on heparin but developed hematuria.  His INR is 2.59.  I would leave him off heparin for now but would try to restart it when INR < 2 (especially given history of CVA and LA thrombus in setting of atrial fibrillation).  5. CKD: Creatinine actually lower than most recent prior creatinine from the office.  Follow carefully with diuresis.    Marca Ancona 02/28/2012 6:07 PM

## 2012-02-28 NOTE — Progress Notes (Signed)
CRITICAL VALUE ALERT  Critical value received:  K+ 2.7  Date of notification:  02/28/12  Time of notification:  0536  Critical value read back:yes  Nurse who received alert:  M. Daryn Hicks  MD notified (1st page):  Dr. Darrick Penna  Time of first page:  [phone]  MD notified (2nd page):  Time of second page:  Responding MD:    Time MD responded:

## 2012-02-28 NOTE — Progress Notes (Signed)
CRITICAL VALUE ALERT  Critical value received:  Troponin 4.37  Date of notification:  02/28/12  Time of notification:  0005  Critical value read back:yes  Nurse who received alert:  B. White  MD notified (1st page):  Dr. Darrick Penna  Time of first page:  0100  MD notified (2nd page):  Time of second page:  Responding MD:  Dr. Darrick Penna  Time MD responded:

## 2012-02-28 NOTE — Progress Notes (Signed)
eLink Physician-Brief Progress Note Patient Name: Erik Adams DOB: [birth year] MRN: [id number]  Date of Service  02/28/2012   HPI/Events of Note  Elevated trop in the setting of CAD/stent - off pressors   eICU Interventions  Plan: Heparin BB ECHO ordered for AM already Continue to cycle enzymes   Intervention Category Intermediate Interventions: Other:  Latiqua Daloia 02/28/2012, 3:58 AM

## 2012-02-28 NOTE — Progress Notes (Signed)
INITIAL NUTRITION ASSESSMENT  DOCUMENTATION CODES Per approved criteria  -Not Applicable   INTERVENTION: 1.  Enteral nutrition; Advance Oxepa by 15 mL to 55 mL/hr goal to provide 1980 kcal, 82g protein, 1036 mL free water.  NUTRITION DIAGNOSIS: Inadequate oral intake related to inability to eat as evidenced by pt intubated, NPO.   Monitor:  1.  Enteral nutrition; advancement to goal rate with tolerance to meet >/=90% of estimated needs.  Reason for Assessment: consult  67 y.o. male  Admitting Dx: shortness of breath  ASSESSMENT: Pt admitted with shortness of breath, fever, and profound hypoxia. Patient is currently intubated on ventilator support.  MV: 11.7 L/min Temp:Temp (24hrs), Avg:99.7 F (37.6 C), Min:98.8 F (37.1 C), Max:101.5 F (38.6 C)  Pt currently receiving Oxepa @ 40 mL/hr which provides 1440 kcal, 60g protein, and 753 mL free water. Pt is awake and alert on vent.  Able to communicate tolerance of TF without nausea or abdominal pain.   Height: Ht Readings from Last 1 Encounters:  02/27/12 5\' 10"  (1.778 m)    Weight: Wt Readings from Last 1 Encounters:  02/27/12 175 lb 11.3 oz (79.7 kg)    Ideal Body Weight: 166 lbs  % Ideal Body Weight: 105%  Wt Readings from Last 10 Encounters:  02/27/12 175 lb 11.3 oz (79.7 kg)  02/18/12 185 lb (83.915 kg)  02/11/12 186 lb (84.369 kg)  01/20/12 178 lb 6.4 oz (80.922 kg)  12/27/11 180 lb (81.647 kg)  12/07/11 177 lb 1.6 oz (80.332 kg)  12/07/11 177 lb 1.6 oz (80.332 kg)  12/04/11 193 lb 9.6 oz (87.816 kg)  11/13/11 180 lb 6.4 oz (81.829 kg)  05/17/11 180 lb 3.2 oz (81.738 kg)    Usual Body Weight: 180 lbs  % Usual Body Weight: 97%  BMI:  Body mass index is 25.21 kg/(m^2).  Estimated Nutritional Needs: Kcal: 2112 Protein: 80-95g Fluid: ~2.0 L/day  Skin: edema to lower extremities  Diet Order:  NPO  EDUCATION NEEDS: -Education not appropriate at this time   Intake/Output Summary (Last 24  hours) at 02/28/12 0955 Last data filed at 02/28/12 0900  Gross per 24 hour  Intake   1116 ml  Output   1945 ml  Net   -829 ml    Last BM: 1/15  Labs:   Lab 02/28/12 0425 02/27/12 1630 02/27/12 1345  NA 138 -- 137  K 2.7* -- 4.0  CL 101 -- 97  CO2 29 -- 24  BUN 26* -- 28*  CREATININE 1.73* -- 2.02*  CALCIUM 8.2* -- 9.3  MG -- 2.1 --  PHOS -- 3.9 --  GLUCOSE 95 -- 138*    CBG (last 3)   Basename 02/27/12 1346  GLUCAP 136*    Scheduled Meds:   . antiseptic oral rinse  15 mL Mouth Rinse QID  . aspirin  300 mg Rectal Daily  . cefTRIAXone (ROCEPHIN)  IV  1 g Intravenous Q24H  . chlorhexidine  15 mL Mouth Rinse BID  . levofloxacin (LEVAQUIN) IV  750 mg Intravenous Q48H  . metoprolol  5 mg Intravenous Q8H  . midazolam  2 mg Intravenous Once  . oseltamivir  150 mg Oral BID  . pantoprazole (PROTONIX) IV  40 mg Intravenous QHS  . rocuronium  75 mg Intravenous Once  . vancomycin  750 mg Intravenous Q12H    Continuous Infusions:   . sodium chloride 50 mL/hr at 02/27/12 1630  . feeding supplement (OXEPA) 1,000 mL (02/28/12 0030)  . fentaNYL  infusion INTRAVENOUS    . heparin 800 Units/hr (02/28/12 0240)  . norepinephrine (LEVOPHED) Adult infusion      Past Medical History  Diagnosis Date  . CAD (coronary artery disease)     a. s/p BMS to mLAD 2000;  b. LHC 3/12:  sig ISR mLAD, RI 80-90%, Dx1 90% (too small for PCI);  PCI:  ION DES to mLAD, ION DES to RI  . HTN (hypertension)   . Hypercholesteremia   . CVA (cerebral infarction)   . CRI (chronic renal insufficiency)     Baseline creatinine of 1.4 to 1.7  . 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 EF is 45 to 50%;  b. echo 10/13:  inf HK, mod LVH, EF 20%, mild AI, mod MR, mod TR, mod BAE, PASP 46  . Atrial fibrillation 11/13/2011    coumadin and amiodarone; s/p DCCV November 2013; repeat DCCV Feb 13, 2012  . Left atrial thrombus     TEE 11/2011  . Iron deficiency anemia     Past Surgical  History  Procedure Date  . Coronary angioplasty with stent placement 2000    BMS to the LAD  . Mastoidectomy   . Coronary angioplasty with stent placement March 2012    DES to LAD and Ramus intermdius  . Tee without cardioversion 12/06/2011    Procedure: TRANSESOPHAGEAL ECHOCARDIOGRAM (TEE);  Surgeon: Pricilla Riffle, MD;  Location: [facility] ENDOSCOPY;  Service: Cardiovascular;  Laterality: N/A;  . Cardioversion 01/06/2012    Procedure: CARDIOVERSION;  Surgeon: Wendall Stade, MD;  Location: [facility] ENDOSCOPY;  Service: Cardiovascular;  Laterality: N/A;  call anesthesia  maybe able to do cardio. early at 12:00  . Cardioversion 02/13/2012    Procedure: CARDIOVERSION;  Surgeon: Cassell Clement, MD;  Location: [facility] ENDOSCOPY;  Service: Cardiovascular;  Laterality: N/A;  . Tonsillectomy   . Cholecystectomy 1999  . Left inguinal hernia repair 02/2007  . Hernia repair     Loyce Dys, MS RD LDN Clinical Inpatient Dietitian Pager: [phone] Weekend/After hours pager: [phone]

## 2012-02-28 NOTE — Progress Notes (Signed)
ANTICOAGULATION CONSULT NOTE - Follow Up Consult  Pharmacy Consult for IV heparin Indication: elevated troponins  Allergies  Allergen Reactions  . Ace Inhibitors Other (See Comments)    Unknown reaction  . Clonidine Derivatives Other (See Comments)    Unknown reaction    Patient Measurements: Height: 5\' 10"  (177.8 cm) Weight: 175 lb 11.3 oz (79.7 kg) IBW/kg (Calculated) : 73  Heparin Dosing Weight: 73 kg   Vital Signs: Temp: 98.8 F (37.1 C) (01/17 0000) Temp src: Oral (01/17 0000) BP: 145/82 mmHg (01/17 1000) Pulse Rate: 63  (01/17 1000)  Labs:  Basename 02/28/12 0935 02/28/12 0425 02/27/12 2200 02/27/12 1630 02/27/12 1345  HGB -- 9.0* -- -- 10.4*  HCT -- 28.9* -- -- 33.6*  PLT -- 197 -- -- 307  APTT -- -- -- 48* --  LABPROT -- -- -- -- 25.5*  INR -- -- -- -- 2.46*  HEPARINUNFRC 0.12* -- -- -- --  CREATININE -- 1.73* -- -- 2.02*  CKTOTAL -- -- -- -- --  CKMB -- -- -- -- --  TROPONINI -- 5.18* 4.37* -- 0.37*    Estimated Creatinine Clearance: 42.8 ml/min (by C-G formula based on Cr of 1.73).   Assessment:  75 yom with refractory afib, L atrial thrombus in 11/2011 on Coumadin PTA. Patient is s/p cardioversion 02/13/2012. INR 2.46 on admit. Patient with significant cardiac history (including CAD s/p DES) and troponin inpatient continues to rise > 4. MD order for IV heparin to start 1/16 despite therapeutic INR.   IV heparin currently running at 800 units/hr and heparin level returns subtherapeutic at 0.12.  Troponin further increased to 5.18 today.  Cardiology consult pending.  BB/ASA started. Hgb 9, platelets okay - no bleeding documented. No repeat PT/INR today.  Goal of Therapy:  INR 2-3 Monitor platelets by anticoagulation protocol: Yes   Plan:   Increase IV heparin to 1000 units/hr, repeat heparin level at 1700 today.  Will repeat PT/INR this AM to confirm that INR is trending down while on therapeutic heparin.  Daily heparin level and CBC  Geoffry Paradise, PharmD, BCPS Pager: [phone] 10:38 AM Pharmacy #: [phone]

## 2012-02-29 ENCOUNTER — Inpatient Hospital Stay (HOSPITAL_COMMUNITY): Payer: Medicare Other

## 2012-02-29 DIAGNOSIS — J189 Pneumonia, unspecified organism: Secondary | ICD-10-CM

## 2012-02-29 DIAGNOSIS — I5033 Acute on chronic diastolic (congestive) heart failure: Secondary | ICD-10-CM

## 2012-02-29 DIAGNOSIS — I214 Non-ST elevation (NSTEMI) myocardial infarction: Secondary | ICD-10-CM

## 2012-02-29 DIAGNOSIS — R509 Fever, unspecified: Secondary | ICD-10-CM

## 2012-02-29 LAB — PRO B NATRIURETIC PEPTIDE: Pro B Natriuretic peptide (BNP): 6251 pg/mL — ABNORMAL HIGH (ref 0–125)

## 2012-02-29 LAB — PROTIME-INR: INR: 1.95 — ABNORMAL HIGH (ref 0.00–1.49)

## 2012-02-29 LAB — BASIC METABOLIC PANEL
CO2: 30 mEq/L (ref 19–32)
Calcium: 8.5 mg/dL (ref 8.4–10.5)
Chloride: 101 mEq/L (ref 96–112)
Potassium: 3.3 mEq/L — ABNORMAL LOW (ref 3.5–5.1)
Sodium: 139 mEq/L (ref 135–145)

## 2012-02-29 LAB — CBC
MCH: 24.3 pg — ABNORMAL LOW (ref 26.0–34.0)
Platelets: 192 10*3/uL (ref 150–400)
RBC: 3.79 MIL/uL — ABNORMAL LOW (ref 4.22–5.81)
WBC: 7.9 10*3/uL (ref 4.0–10.5)

## 2012-02-29 LAB — URINE CULTURE
Colony Count: NO GROWTH
Culture: NO GROWTH

## 2012-02-29 LAB — PHOSPHORUS: Phosphorus: 2.3 mg/dL (ref 2.3–4.6)

## 2012-02-29 LAB — MAGNESIUM: Magnesium: 2.1 mg/dL (ref 1.5–2.5)

## 2012-02-29 LAB — TROPONIN I: Troponin I: 1.78 ng/mL (ref <0.30)

## 2012-02-29 MED ORDER — FENTANYL CITRATE 0.05 MG/ML IJ SOLN: 12.5000 ug | INTRAMUSCULAR | Status: DC | PRN

## 2012-02-29 MED ORDER — POTASSIUM CHLORIDE CRYS ER 20 MEQ PO TBCR
40.0000 meq | EXTENDED_RELEASE_TABLET | Freq: Four times a day (QID) | ORAL | Status: DC
  Filled 2012-02-29 (×2): qty 1

## 2012-02-29 MED ORDER — SODIUM CHLORIDE 0.9 % IV SOLN
INTRAVENOUS | Status: DC
  Administered 2012-02-29: 500 mL via INTRAVENOUS
  Administered 2012-03-02: 12:00:00 via INTRAVENOUS

## 2012-02-29 MED ORDER — HEPARIN (PORCINE) IN NACL 100-0.45 UNIT/ML-% IJ SOLN: 1000.0000 [IU]/h | INTRAMUSCULAR | Status: DC

## 2012-02-29 MED ORDER — OSELTAMIVIR PHOSPHATE 6 MG/ML PO SUSR
75.0000 mg | Freq: Two times a day (BID) | ORAL | Status: DC
  Administered 2012-02-29 – 2012-03-01 (×2): 75 mg via ORAL
  Filled 2012-02-29 (×3): qty 12.5

## 2012-02-29 MED ORDER — POTASSIUM CHLORIDE 20 MEQ/15ML (10%) PO LIQD
40.0000 meq | Freq: Four times a day (QID) | ORAL | Status: DC
  Administered 2012-02-29: 40 meq
  Filled 2012-02-29: qty 30

## 2012-02-29 MED ORDER — AMIODARONE HCL 200 MG PO TABS
200.0000 mg | ORAL_TABLET | Freq: Every day | ORAL | Status: DC
  Administered 2012-03-01 – 2012-03-03 (×3): 200 mg via ORAL
  Filled 2012-02-29 (×3): qty 1

## 2012-02-29 MED ORDER — CARVEDILOL 12.5 MG PO TABS
12.5000 mg | ORAL_TABLET | Freq: Two times a day (BID) | ORAL | Status: DC
  Administered 2012-02-29: 12.5 mg via ORAL
  Filled 2012-02-29 (×4): qty 1

## 2012-02-29 MED ORDER — HEPARIN (PORCINE) IN NACL 100-0.45 UNIT/ML-% IJ SOLN
1000.0000 [IU]/h | INTRAMUSCULAR | Status: DC
  Filled 2012-02-29: qty 250

## 2012-02-29 MED ORDER — FUROSEMIDE 10 MG/ML IJ SOLN
80.0000 mg | Freq: Two times a day (BID) | INTRAMUSCULAR | Status: DC
  Administered 2012-02-29: 80 mg via INTRAVENOUS
  Filled 2012-02-29 (×3): qty 8

## 2012-02-29 MED ORDER — AMIODARONE HCL 200 MG PO TABS
200.0000 mg | ORAL_TABLET | Freq: Every day | ORAL | Status: DC
  Administered 2012-02-29: 200 mg
  Filled 2012-02-29: qty 1

## 2012-02-29 MED ORDER — POTASSIUM CHLORIDE 20 MEQ/15ML (10%) PO LIQD: 40.0000 meq | Freq: Four times a day (QID) | ORAL | Status: AC

## 2012-02-29 MED ORDER — POTASSIUM CHLORIDE CRYS ER 20 MEQ PO TBCR
EXTENDED_RELEASE_TABLET | ORAL | Status: AC
  Administered 2012-02-29: 40 meq
  Filled 2012-02-29: qty 26

## 2012-02-29 MED ORDER — SODIUM CHLORIDE 0.9 % IV SOLN
INTRAVENOUS | Status: DC
  Administered 2012-02-29: 500 mL via INTRAVENOUS

## 2012-02-29 NOTE — Progress Notes (Addendum)
ANTICOAGULATION CONSULT NOTE - Follow Up Consult  Pharmacy Consult for IV heparin Indication: NSTEMI  Allergies  Allergen Reactions  . Ace Inhibitors Other (See Comments)    Unknown reaction  . Clonidine Derivatives Other (See Comments)    Unknown reaction    Patient Measurements: Height: 5\' 10"  (177.8 cm) Weight: 171 lb 11.8 oz (77.9 kg) IBW/kg (Calculated) : 73  Heparin Dosing Weight: 73 kg   Vital Signs: Temp: 96.9 F (36.1 C) (01/18 0400) Temp src: Axillary (01/18 0400) BP: 141/70 mmHg (01/18 0600) Pulse Rate: 58  (01/18 0600)  Labs:  Basename 02/29/12 0435 02/28/12 2230 02/28/12 1700 02/28/12 0935 02/28/12 0425 02/27/12 1630 02/27/12 1345  HGB 9.2* -- -- -- 9.0* -- --  HCT 30.0* -- -- -- 28.9* -- 33.6*  PLT 192 -- -- -- 197 -- 307  APTT -- -- -- -- -- 48* --  LABPROT 21.5* -- -- 26.5* -- -- 25.5*  INR 1.95* -- -- 2.59* -- -- 2.46*  HEPARINUNFRC -- -- 0.10* 0.12* -- -- --  CREATININE 1.60* -- 1.66* -- 1.73* -- --  CKTOTAL -- -- -- -- -- -- --  CKMB -- -- -- -- -- -- --  TROPONINI 1.78* 2.64* 3.06* -- -- -- --    Estimated Creatinine Clearance: 46.3 ml/min (by C-G formula based on Cr of 1.6).   Assessment:  33 yom with refractory afib, L atrial thrombus in 11/2011 on Coumadin PTA. Patient is s/p cardioversion 02/13/2012. INR 2.46 on admit. Patient with significant cardiac history (including CAD s/p DES) and troponin inpatient continues to rise > 4. MD order for IV heparin to start 1/16 despite therapeutic INR.  On 1/17 PM, IV heparin was discontinued given +blood in urine.  INR AM of 1/18 = 1.95, cardiology ordered to re-start IV heparin.  Per RN, urine looks pink.  Communicated this with Theodore Demark (Cardiology PA) and agreed to resume IV heparin with close follow up.  Troponins trending down.    Hgb 9.2, plts wnl.    Goal of Therapy:  INR 2-3 Monitor platelets by anticoagulation protocol: Yes   Plan:   Resume IV heparin at 1000 units/hr  Heparin level  at 1500 today  Daily Heparin level and CBC   Will follow up with bleeding (risk vs benefit of continuing IV heparin)  Geoffry Paradise, PharmD, BCPS Pager: [phone] 7:57 AM Pharmacy #: [id number]    Addendum:  Received a call from Theodore Demark, upon communicated with Dr. Jens Som - would continue to hold IV heparin for today and resume tomorrow at 0800.  Rhonda ordered for IV heparin with start time of tomorrow but pharmacy will discontinue this order from [facility] to lessen confusion.  Pharmacy will f/u with RN in AM re: bleeding prior to re-entering order to start at 0800.    D/C ordered heparin levels for now.  Geoffry Paradise, PharmD, BCPS Pager: [phone] 8:44 AM Pharmacy #: [id number]'

## 2012-02-29 NOTE — Progress Notes (Signed)
Patient extubated to 2 LPM [place] and doing well at this time. No stridor noted. O2 sats are 98%. RR 18. Will continue to monitor.

## 2012-02-29 NOTE — Progress Notes (Signed)
Per nursing staff report doing well with pills and liquids; would like to order dinner.   Advance diet as tolerated;

## 2012-02-29 NOTE — Progress Notes (Signed)
   Subjective:  Intubated and alert; Denies CP or dyspnea   Objective:  Filed Vitals:   02/29/12 0344 02/29/12 0400 02/29/12 0500 02/29/12 0600  BP: 141/69 143/72  141/70  Pulse: 53 52  58  Temp:  96.9 F (36.1 C)    TempSrc:  Axillary    Resp: 15 14  14   Height:      Weight:   171 lb 11.8 oz (77.9 kg)   SpO2: 99% 100%  99%    Intake/Output from previous day:  Intake/Output Summary (Last 24 hours) at 02/29/12 0714 Last data filed at 02/29/12 0600  Gross per 24 hour  Intake 1991.33 ml  Output   3910 ml  Net -1918.67 ml    Physical Exam: Physical exam: Well-developed well-nourished in no acute distress. Intubated Skin is warm and dry.  HEENT is normal.  Neck is supple.  Chest with mildly reduced BS Cardiovascular exam is regular rate and rhythm.  Abdominal exam nontender or distended. No masses palpated. Extremities show no edema. neuro alert, moves ext    Lab Results: Basic Metabolic Panel:  Basename 02/29/12 0435 02/28/12 1700 02/27/12 1630  NA 139 139 --  K 3.3* 3.1* --  CL 101 102 --  CO2 30 30 --  GLUCOSE 112* 102* --  BUN 29* 28* --  CREATININE 1.60* 1.66* --  CALCIUM 8.5 8.3* --  MG 2.1 -- 2.1  PHOS 2.3 -- 3.9   CBC:  Basename 02/29/12 0435 02/28/12 0425 02/27/12 1345  WBC 7.9 7.0 --  NEUTROABS -- -- 9.2*  HGB 9.2* 9.0* --  HCT 30.0* 28.9* --  MCV 79.2 79.0 --  PLT 192 197 --   Cardiac Enzymes:  Basename 02/29/12 0435 02/28/12 2230 02/28/12 1700  CKTOTAL -- -- --  CKMB -- -- --  CKMBINDEX -- -- --  TROPONINI 1.78* 2.64* 3.06*     Assessment/Plan:  1 non-ST elevation myocardial infarction-the patient has ruled in for an infarct. Continue aspirin, beta blocker and statin. INR is now less than 2. Begin IV heparin. She will ultimately require further ischemia evaluation. Given his renal insufficiency we may start with a functional study. Further recommendations based on followup BUN and creatinine. 2 acute on chronic systolic congestive  heart failure-plan continue IV diuresis and follow renal function. 3 ischemic cardiomyopathy-continue carvedilol, hydralazine and nitrates. We'll resume ARB later once it is clear renal function is stable. 4 VDRF-possible extubation today. Critical care following. 5 question pneumonia-continue antibiotics. 6 history of paroxysmal atrial fibrillation with H/O CVA-remains in sinus; continue amiodarone. Resume Coumadin once it is clear he will not require procedures. 7 hypertension-adjust on followup blood pressures 8 chronic renal failure-follow renal function with diuresis. 9 hypokalemia-supplement  Olga Millers 02/29/2012, 7:14 AM

## 2012-02-29 NOTE — Progress Notes (Signed)
Diet clear liquids ordered; we will advance as tolerated

## 2012-02-29 NOTE — Progress Notes (Signed)
PULMONARY  / CRITICAL CARE MEDICINE  Name: Erik Adams MRN: [id number] DOB: 07-04-1945    LOS: 2  REFERRING MD :  EDP  PATIENT SUMMARY:  45 h/o cad/chf ef 20% presents Hypoxia/dyspena/fever, RML > L sided infiltrates, VDRF. Positive troponin, started on heparin, ASA, b-blockade 1/16.   SIGNIFICANT EVENTS:   1/16 positive troponin, heparin started 1/16 TTE: EF 25% 1/18 Heparin held due to hematuria. Foley D/C'd  LINES / TUBES: 1-16 et>> 1/19 1-16 rt cvl>>>   CULTURES: 1-16 bc x 2>>  1-16 uc>> 1-16 sputum>> 1-16 flu panel>>  ANTIBIOTICS: 1-16 vanc>> 1/19 1-16 zoysn>> 1/16 1/16 ceftriaxone >>  1-16 tamiflu>> 1/16 levofloxacin>> 1/19    INTERVAL HISTORY:  RASS 0. Passed SBT. Extubated and tolerating well.  VITAL SIGNS: Temp:  [96.9 F (36.1 C)-98.5 F (36.9 C)] 97 F (36.1 C) (01/18 1600) Pulse Rate:  [50-65] 65  (01/18 1600) Resp:  [11-20] 17  (01/18 1600) BP: (132-157)/(69-85) 150/76 mmHg (01/18 1600) SpO2:  [95 %-100 %] 97 % (01/18 1600) FiO2 (%):  [40 %] 40 % (01/18 0801) Weight:  [77.9 kg (171 lb 11.8 oz)] 77.9 kg (171 lb 11.8 oz) (01/18 0500) HEMODYNAMICS: CVP:  [4 mmHg-10 mmHg] 6 mmHg VENTILATOR SETTINGS: Vent Mode:  [-] CPAP FiO2 (%):  [40 %] 40 % Set Rate:  [14 bmp] 14 bmp Vt Set:  [550 mL] 550 mL PEEP:  [5 cmH20] 5 cmH20 Pressure Support:  [5 cmH20] 5 cmH20 Plateau Pressure:  [14 cmH20-15 cmH20] 15 cmH20 INTAKE / OUTPUT: Intake/Output      01/17 0701 - 01/18 0700 01/18 0701 - 01/19 0700   P.O.  240   I.V. (mL/kg) 652.3 (8.4)    Other 40 360   NG/GT 810 445   IV Piggyback 584    Total Intake(mL/kg) 2086.3 (26.8) 1045 (13.4)   Urine (mL/kg/hr) 3910 (2.1) 975 (1.3)   Total Output 3910 975   Net -1823.7 +70          PHYSICAL EXAMINATION: General: NAD Neuro: Intact HEENT: WNL Cardiovascular: RRR s M Lungs: Clear Abdomen: NABS, soft Ext: nno edema  LABS: Cbc  Lab 02/29/12 0435 02/28/12 0425 02/27/12 1345  WBC 7.9 -- --  HGB  9.2* 9.0* 10.4*  HCT 30.0* 28.9* 33.6*  PLT 192 197 307    Chemistry  Lab 02/29/12 0435 02/28/12 1700 02/28/12 0425 02/27/12 1630  NA 139 139 138 --  K 3.3* 3.1* 2.7* --  CL 101 102 101 --  CO2 30 30 29  --  BUN 29* 28* 26* --  CREATININE 1.60* 1.66* 1.73* --  CALCIUM 8.5 8.3* 8.2* --  MG 2.1 -- -- 2.1  PHOS 2.3 -- -- 3.9  GLUCOSE 112* 102* 95 --    Liver fxn  Lab 02/27/12 1345  AST 36  ALT 33  ALKPHOS 93  BILITOT 1.0  PROT 6.6  ALBUMIN 3.4*   coags  Lab 02/29/12 0435 02/28/12 0935 02/27/12 1630 02/27/12 1345  APTT -- -- 48* --  INR 1.95* 2.59* -- 2.46*   Sepsis markers  Lab 02/27/12 1630 02/27/12 1430  LATICACIDVEN -- 1.5  PROCALCITON 0.51 --   Cardiac markers  Lab 02/29/12 1120 02/29/12 0435 02/28/12 2230  CKTOTAL -- -- --  CKMB -- -- --  TROPONINI 2.42* 1.78* 2.64*   BNP  Lab 02/29/12 0435 02/27/12 1345  PROBNP 6251.0* 18223.0*   ABG  Lab 02/28/12 0358 02/27/12 2318 02/27/12 1550  PHART 7.466* 7.459* 7.474*  PCO2ART 38.7 37.8 32.8*  PO2ART 72.9* 74.8* 133.0*  HCO3 27.3* 26.3* 23.8  TCO2 24.4 24.3 21.9    CBG trend  Lab 02/27/12 1346  GLUCAP 136*    IMAGING: CXR: mild edema pattern with RLL confluence vs effusion  ECG: No new  DIAGNOSES: Active Problems:  HYPERTENSION  CAD (coronary artery disease)  HTN (hypertension)  CKD (chronic kidney disease) stage 3, GFR 30-59 ml/min  S/P coronary artery stent placement  Heart failure, acute on chronic, systolic and diastolic  Atrial fibrillation  SOB (shortness of breath)  ARDS (adult respiratory distress syndrome)  Respiratory failure  Acute myocardial infarction, subendocardial infarction, initial episode of care   ASSESSMENT / PLAN:  PULMONARY  ASSESSMENT: Acute resp failure with suspected PNA Pylmonary edema,  Possible flu (known encounter, nasal swab neg) PLAN:   Extubate Monitor in ICU post extubation  CARDIOVASCULAR ASSESSMENT:  Htn Afib >> NSR NSTEMI CHF  PLAN:   Cards managing Holding heparin for now due to hematuria   RENAL ASSESSMENT: ARF, likely ATN, resolved PLAN:   -monitor renal panel intermittently    HEMATOLOGIC  ASSESSMENT:   Coumadin use for A Fib PLAN:  -Likely to resume heparin 1/19   INFECTIOUS  ASSESSMENT:   Possible CAP,  Possible Flu with pneumonitis PLAN:   Narrow abx to ceftrx and oseltamivir 1/19. Consider early D/C Recheck PCT   30 mins CCM time  Billy Fischer, MD ; [facility] [phone].  After 5:30 PM or weekends, call [phone]

## 2012-02-29 NOTE — Progress Notes (Signed)
Rec'd a call re: Hematuria  Pt heparin d/c'd yest secondary to hematuria, urine still a little pink today. He is high risk for events with hx CVA and LAA thrombus. Spoke with [facility] and will hold heparin today, start tomorrow AM. Call for worsening hematuria.

## 2012-03-01 LAB — PROTIME-INR: INR: 1.43 (ref 0.00–1.49)

## 2012-03-01 LAB — BASIC METABOLIC PANEL
GFR calc Af Amer: 49 mL/min — ABNORMAL LOW (ref >90)
GFR calc non Af Amer: 43 mL/min — ABNORMAL LOW (ref >90)
Glucose, Bld: 88 mg/dL (ref 70–99)
Potassium: 3.6 mEq/L (ref 3.5–5.1)
Sodium: 140 mEq/L (ref 135–145)

## 2012-03-01 LAB — HEPARIN LEVEL (UNFRACTIONATED): Heparin Unfractionated: 0.13 IU/mL — ABNORMAL LOW (ref 0.30–0.70)

## 2012-03-01 LAB — CBC
Hemoglobin: 9.2 g/dL — ABNORMAL LOW (ref 13.0–17.0)
MCHC: 30.2 g/dL (ref 30.0–36.0)
WBC: 9.2 10*3/uL (ref 4.0–10.5)

## 2012-03-01 LAB — CULTURE, RESPIRATORY W GRAM STAIN: Special Requests: NORMAL

## 2012-03-01 MED ORDER — WARFARIN - PHARMACIST DOSING INPATIENT: Freq: Every day | Status: DC

## 2012-03-01 MED ORDER — HEPARIN (PORCINE) IN NACL 100-0.45 UNIT/ML-% IJ SOLN
1250.0000 [IU]/h | INTRAMUSCULAR | Status: DC
  Administered 2012-03-01: 1000 [IU]/h via INTRAVENOUS
  Filled 2012-03-01 (×2): qty 250

## 2012-03-01 MED ORDER — CARVEDILOL 25 MG PO TABS
25.0000 mg | ORAL_TABLET | Freq: Two times a day (BID) | ORAL | Status: DC
  Administered 2012-03-01 – 2012-03-03 (×5): 25 mg via ORAL
  Filled 2012-03-01 (×7): qty 1

## 2012-03-01 MED ORDER — WARFARIN SODIUM 6 MG PO TABS
6.0000 mg | ORAL_TABLET | Freq: Once | ORAL | Status: AC
  Administered 2012-03-01: 6 mg via ORAL
  Filled 2012-03-01: qty 1

## 2012-03-01 MED ORDER — SODIUM CHLORIDE 0.9 % IJ SOLN
3.0000 mL | Freq: Two times a day (BID) | INTRAMUSCULAR | Status: DC
  Administered 2012-03-01 – 2012-03-03 (×4): 3 mL via INTRAVENOUS

## 2012-03-01 MED ORDER — FUROSEMIDE 80 MG PO TABS
120.0000 mg | ORAL_TABLET | Freq: Every morning | ORAL | Status: DC
  Administered 2012-03-01 – 2012-03-03 (×3): 120 mg via ORAL
  Filled 2012-03-01 (×3): qty 1

## 2012-03-01 MED ORDER — POTASSIUM CHLORIDE CRYS ER 20 MEQ PO TBCR
40.0000 meq | EXTENDED_RELEASE_TABLET | Freq: Once | ORAL | Status: AC
  Administered 2012-03-01: 40 meq via ORAL
  Filled 2012-03-01: qty 2

## 2012-03-01 MED ORDER — FUROSEMIDE 80 MG PO TABS
80.0000 mg | ORAL_TABLET | Freq: Every evening | ORAL | Status: DC
  Administered 2012-03-01 – 2012-03-02 (×2): 80 mg via ORAL
  Filled 2012-03-01 (×3): qty 1

## 2012-03-01 NOTE — Progress Notes (Signed)
ANTICOAGULATION CONSULT NOTE - Follow Up Consult  Pharmacy Consult for IV heparin, Coumadin Indication: atrial fib s/p cardioversion, L atrial thrombus, NSTEMI  Allergies  Allergen Reactions  . Ace Inhibitors Other (See Comments)    Unknown reaction  . Clonidine Derivatives Other (See Comments)    Unknown reaction    Patient Measurements: Height: 5\' 10"  (177.8 cm) Weight: 171 lb 11.8 oz (77.9 kg) IBW/kg (Calculated) : 73  Heparin Dosing Weight: 73 kg   Vital Signs: Temp: 99 F (37.2 C) (01/19 0800) Temp src: Oral (01/19 0800) BP: 158/78 mmHg (01/19 0800) Pulse Rate: 66  (01/19 0800)  Labs:  Basename 03/01/12 0815 03/01/12 0425 02/29/12 1120 02/29/12 0435 02/28/12 2230 02/28/12 1700 02/28/12 0935 02/28/12 0425 02/27/12 1630  HGB -- 9.2* -- 9.2* -- -- -- -- --  HCT -- 30.5* -- 30.0* -- -- -- 28.9* --  PLT -- 212 -- 192 -- -- -- 197 --  APTT -- -- -- -- -- -- -- -- 48*  LABPROT 17.1* -- -- 21.5* -- -- 26.5* -- --  INR 1.43 -- -- 1.95* -- -- 2.59* -- --  HEPARINUNFRC -- -- -- -- -- 0.10* 0.12* -- --  CREATININE -- 1.61* -- 1.60* -- 1.66* -- -- --  CKTOTAL -- -- -- -- -- -- -- -- --  CKMB -- -- -- -- -- -- -- -- --  TROPONINI -- -- 2.42* 1.78* 2.64* -- -- -- --    Estimated Creatinine Clearance: 46 ml/min (by C-G formula based on Cr of 1.61).   Assessment:  26 yom with refractory afib, L atrial thrombus in 11/2011 on Coumadin PTA. Patient is s/p cardioversion 02/13/2012. INR 2.46 on admit. Patient with significant cardiac history (including CAD s/p DES) and troponin inpatient continues to rise > 4.  IV heparin was started 1/17 despite elevated INR per MD.  IV heparin stopped 1/17PM given blood in urine.    INR today 1.43, cardiology ordered to resume IV heparin and Coumadin.  Per communication with RN, patient's urine is still red with blood clots.  This information was relayed to cardiology (PA Bjorn Loser Barrett and Dr. Jens Som).  Given patient history/risk, Dr. Jens Som  feels it's necessary to resume anticoagulation.  Pharmacy will not give IV heparin bolus, will aim for lower end of therapeutic, monitor closely for bleeding and will stop IV heparin when INR > 2 on Coumadin.    Per [facility] anticoagulation clinic's notes on 1/7, patient was receiving Coumadin 5mg  po daily except 2.5mg  on Monday.  [facility] pharmacy technician unable to obtain accurate med rec for this patient s/p extubation today.    Of note, patient was on amiodarone 400 mg po daily PTA, this was reduced to 200mg  po daily during this admission.  Giving known drug-drug interaction between amiodarone and Coumadin, Coumadin dose may need to be increased slightly in the next couple of weeks  Goal of Therapy:  Heparin level 0.3 - 0.7 (aim for lowe end of therapeutic) INR 2-3 Monitor platelets by anticoagulation protocol: Yes   Plan:   Resume IV heparin at 1000 units/hr (RN aware of situation)  Heparin level at 1800.   Daily Heparin level and CBC   Coumadin 6mg  po x 1 tonight  Will f/u closely   Geoffry Paradise, PharmD, BCPS Pager: [phone] 12:03 PM Pharmacy #: [id number]

## 2012-03-01 NOTE — Progress Notes (Signed)
   Subjective:  Extubated; Denies CP or dyspnea   Objective:  Filed Vitals:   03/01/12 0000 03/01/12 0010 03/01/12 0335 03/01/12 0400  BP:  143/75 151/79   Pulse:  70 74   Temp: 98.7 F (37.1 C)   97.3 F (36.3 C)  TempSrc: Oral   Oral  Resp:  18 19   Height:      Weight:      SpO2:  95% 91%     Intake/Output from previous day:  Intake/Output Summary (Last 24 hours) at 03/01/12 4098 Last data filed at 03/01/12 0500  Gross per 24 hour  Intake   1958 ml  Output   2327 ml  Net   -369 ml    Physical Exam: Physical exam: Well-developed well-nourished in no acute distress. Intubated Skin is warm and dry.  HEENT is normal.  Neck is supple.  Chest CTA Cardiovascular exam is regular rate and rhythm.  Abdominal exam nontender or distended. No masses palpated. Extremities show no edema. neuro alert, moves ext    Lab Results: Basic Metabolic Panel:  Basename 03/01/12 0425 02/29/12 0435 02/27/12 1630  NA 140 139 --  K 3.6 3.3* --  CL 102 101 --  CO2 30 30 --  GLUCOSE 88 112* --  BUN 29* 29* --  CREATININE 1.61* 1.60* --  CALCIUM 8.6 8.5 --  MG -- 2.1 2.1  PHOS -- 2.3 3.9   CBC:  Basename 03/01/12 0425 02/29/12 0435 02/27/12 1345  WBC 9.2 7.9 --  NEUTROABS -- -- 9.2*  HGB 9.2* 9.2* --  HCT 30.5* 30.0* --  MCV 79.6 79.2 --  PLT 212 192 --   Cardiac Enzymes:  Basename 02/29/12 1120 02/29/12 0435 02/28/12 2230  CKTOTAL -- -- --  CKMB -- -- --  CKMBINDEX -- -- --  TROPONINI 2.42* 1.78* 2.64*     Assessment/Plan:  1 non-ST elevation myocardial infarction-the patient has ruled in for an infarct. Continue aspirin, beta blocker and statin. INR is now less than 2. Begin IV heparin. MI may have been related to demand ischemia; plan myoview in AM to risk stratify. 2 acute on chronic systolic congestive heart failure-change lasix to preadmission PO dose. 3 ischemic cardiomyopathy-continue carvedilol (increase to preadmission dose of 25 mg po BID), hydralazine  and nitrates. We'll resume ARB later once it is clear renal function is stable. 4 VDRF-resolved 5 Pneumonia-continue antibiotics. 6 history of paroxysmal atrial fibrillation with H/O CVA and recent DCCV 02/13/12-remains in sinus; continue amiodarone. Resume heparin (watch for recurrent hematuria) and Coumadin; continue heparin until INR > 2. 7 hypertension-BP mildly elevated; increase coreg to preadmission dose; resume ARB tomorrow if renal function stable 8 chronic renal failure-follow renal function with diuresis. 9 hypokalemia-supplement  Erik Adams 03/01/2012, 6:39 AM

## 2012-03-01 NOTE — Progress Notes (Signed)
PULMONARY  / CRITICAL CARE MEDICINE  Name: Erik Adams MRN: [id number] DOB: [birth year]    LOS: 3  REFERRING MD :  EDP  PATIENT SUMMARY:  33 h/o cad/chf ef 20% presents Hypoxia/dyspena/fever, RML > L sided infiltrates, VDRF. Positive troponin, started on heparin, ASA, b-blockade 1/16.   SIGNIFICANT EVENTS:   1/16 positive troponin, heparin started 1/16 TTE: EF 25% 1/18 Heparin held due to hematuria. Foley D/C'd  LINES / TUBES: 1-16 et>> 1/18 1-16 rt cvl>>> 1/19  CULTURES: 1-16 bc x 2>>  1-16 uc>> 1-16 sputum>> 1-16 flu panel>>  ANTIBIOTICS: 1-16 vanc>> 1/19 1-16 zoysn>> 1/16 1/16 ceftriaxone >> 1/19  1-16 tamiflu>> 1/19 1/16 levofloxacin>> 1/19    INTERVAL HISTORY:  Tolerating extubation. No distress. No complaints.  VITAL SIGNS: Temp:  [97 F (36.1 C)-99 F (37.2 C)] 99 F (37.2 C) (01/19 0800) Pulse Rate:  [62-74] 66  (01/19 0800) Resp:  [11-19] 13  (01/19 0800) BP: (116-158)/(71-79) 158/78 mmHg (01/19 0800) SpO2:  [91 %-99 %] 98 % (01/19 0800) HEMODYNAMICS:   VENTILATOR SETTINGS:   INTAKE / OUTPUT: Intake/Output      01/18 0701 - 01/19 0700 01/19 0701 - 01/20 0700   P.O. 900    I.V. (mL/kg) 60 (0.8)    Other 400    NG/GT 445    IV Piggyback 58    Total Intake(mL/kg) 1863 (23.9)    Urine (mL/kg/hr) 2425 (1.3) 300 (0.7)   Stool 2    Total Output 2427 300   Net -564 -300        Urine Occurrence 3 x 1 x   Stool Occurrence 3 x      PHYSICAL EXAMINATION: General: NAD Neuro: Intact HEENT: WNL Cardiovascular: RRR s M Lungs: Clear anteriorly Abdomen: NABS, soft Ext: nno edema  LABS: Cbc  Lab 03/01/12 0425 02/29/12 0435 02/28/12 0425  WBC 9.2 -- --  HGB 9.2* 9.2* 9.0*  HCT 30.5* 30.0* 28.9*  PLT 212 192 197    Chemistry  Lab 03/01/12 0425 02/29/12 0435 02/28/12 1700 02/27/12 1630  NA 140 139 139 --  K 3.6 3.3* 3.1* --  CL 102 101 102 --  CO2 30 30 30  --  BUN 29* 29* 28* --  CREATININE 1.61* 1.60* 1.66* --  CALCIUM 8.6 8.5  8.3* --  MG -- 2.1 -- 2.1  PHOS -- 2.3 -- 3.9  GLUCOSE 88 112* 102* --    Liver fxn  Lab 02/27/12 1345  AST 36  ALT 33  ALKPHOS 93  BILITOT 1.0  PROT 6.6  ALBUMIN 3.4*   coags  Lab 03/01/12 0815 02/29/12 0435 02/28/12 0935 02/27/12 1630  APTT -- -- -- 48*  INR 1.43 1.95* 2.59* --   Sepsis markers  Lab 02/27/12 1630 02/27/12 1430  LATICACIDVEN -- 1.5  PROCALCITON 0.51 --   Cardiac markers  Lab 02/29/12 1120 02/29/12 0435 02/28/12 2230  CKTOTAL -- -- --  CKMB -- -- --  TROPONINI 2.42* 1.78* 2.64*   BNP  Lab 02/29/12 0435 02/27/12 1345  PROBNP 6251.0* 18223.0*   ABG  Lab 02/28/12 0358 02/27/12 2318 02/27/12 1550  PHART 7.466* 7.459* 7.474*  PCO2ART 38.7 37.8 32.8*  PO2ART 72.9* 74.8* 133.0*  HCO3 27.3* 26.3* 23.8  TCO2 24.4 24.3 21.9    CBG trend  Lab 02/27/12 1346  GLUCAP 136*    IMAGING: No new CXR  ECG: No new  DIAGNOSES: Active Problems:  HYPERTENSION  CAD (coronary artery disease)  HTN (hypertension)  CKD (chronic  kidney disease) stage 3, GFR 30-59 ml/min  S/P coronary artery stent placement  Heart failure, acute on chronic, systolic and diastolic  Atrial fibrillation  SOB (shortness of breath)  ARDS (adult respiratory distress syndrome)  Respiratory failure  Acute myocardial infarction, subendocardial infarction, initial episode of care   ASSESSMENT / PLAN:  PULMONARY  ASSESSMENT: Acute resp failure, suspect mostly due to pulmonary edema. Little evidence to support dx of PNA Pylmonary edema,  Doubt influenza  PLAN:   D/C Ceftrx and oseltamivir  CARDIOVASCULAR ASSESSMENT:  Htn Afib >> NSR NSTEMI CHF  Severe CM (EF 25%) PLAN:  Mgmt per Cards   RENAL ASSESSMENT: ARF, likely ATN, resolved  Hematuria, resolved PLAN:   -monitor renal panel intermittently -Agree with resumption of anticoagulation   HEMATOLOGIC  ASSESSMENT:   Coumadin use for A Fib PLAN:  -Cont anticoagulation    INFECTIOUS  ASSESSMENT:     Possible CAP - doubt Possible Flu - doubt PLAN:   Monitor off all abx   Transfer to Tele. Most likely should go to Cards service 1/20 if not going to be discharged in next day or two  Billy Fischer, MD ; [facility] [phone].  After 5:30 PM or weekends, call [phone]

## 2012-03-01 NOTE — Progress Notes (Signed)
Brief Anticoagulation Note  Pharmacy received consult to resume IV heparin and Coumadin for this patient @ 0730 this morning.  Per RN report, patient's urine is red with blood clots.  Per communication with Theodore Demark, she will get in touch with Dr. Jens Som and determine whether it's safe to resume IV heparin. An INR was ordered and returned as 1.43.  Pharmacy is aware this is a very high risk patient and anticoagulation is important.    Plan:  Awaiting call back from cardiology. If ok to resume IV heparin and coumadin despite bleeding - pharmacy will follow up with med orders.    Thanks  Geoffry Paradise, PharmD, BCPS Pager: [phone] 9:43 AM Pharmacy #: [phone]

## 2012-03-01 NOTE — Progress Notes (Signed)
ANTICOAGULATION CONSULT NOTE - Follow Up Consult  Pharmacy Consult for IV Heparin Indication: atrial fib s/p cardioversion, L atrial thrombus, NSTEMI  Labs:  Basename 03/01/12 1830 03/01/12 0815 03/01/12 0425 02/29/12 1120 02/29/12 0435 02/28/12 2230 02/28/12 1700 02/28/12 0935 02/28/12 0425  HGB -- -- 9.2* -- 9.2* -- -- -- --  HCT -- -- 30.5* -- 30.0* -- -- -- 28.9*  PLT -- -- 212 -- 192 -- -- -- 197  APTT -- -- -- -- -- -- -- -- --  LABPROT -- 17.1* -- -- 21.5* -- -- 26.5* --  INR -- 1.43 -- -- 1.95* -- -- 2.59* --  HEPARINUNFRC 0.13* -- -- -- -- -- 0.10* 0.12* --  CREATININE -- -- 1.61* -- 1.60* -- 1.66* -- --  CKTOTAL -- -- -- -- -- -- -- -- --  CKMB -- -- -- -- -- -- -- -- --  TROPONINI -- -- -- 2.42* 1.78* 2.64* -- -- --   Estimated Creatinine Clearance: 46 ml/min (by C-G formula based on Cr of 1.61).  Assessment:  41 yom with refractory A.fib on Coumadin PTA.  IV heparin was started 1/17 but stopped later that day due to blood in urine.    INR today 1.43 so IV heparin and Coumadin were resumed.   Heparin level is below target range. Per RN, no worsening of hematuria. Continues to have pink urine.  Goal of Therapy:  Heparin level 0.3 - 0.7 (aim for low end of therapeutic) Monitor platelets by anticoagulation protocol: Yes   Plan:   Increase IV heparin to 1250units/hr.  Heparin level at 3AM.   Will f/u closely.  Charolotte Eke, PharmD, pager [phone]. 03/01/2012,7:56 PM.

## 2012-03-02 ENCOUNTER — Other Ambulatory Visit: Payer: Self-pay

## 2012-03-02 ENCOUNTER — Inpatient Hospital Stay (HOSPITAL_COMMUNITY)
Admit: 2012-03-02 | Discharge: 2012-03-02 | Disposition: A | Discharge status: Home / Self Care | Payer: Medicare Other | Attending: Pulmonary Disease | Admitting: Pulmonary Disease

## 2012-03-02 ENCOUNTER — Ambulatory Visit (HOSPITAL_COMMUNITY)
Admit: 2012-03-02 | Discharge: 2012-03-02 | Disposition: A | Discharge status: Home / Self Care | Payer: Medicare Other | Attending: Pulmonary Disease | Admitting: Pulmonary Disease

## 2012-03-02 DIAGNOSIS — R079 Chest pain, unspecified: Secondary | ICD-10-CM

## 2012-03-02 DIAGNOSIS — I10 Essential (primary) hypertension: Secondary | ICD-10-CM

## 2012-03-02 LAB — HEPARIN LEVEL (UNFRACTIONATED): Heparin Unfractionated: 0.87 IU/mL — ABNORMAL HIGH (ref 0.30–0.70)

## 2012-03-02 LAB — BASIC METABOLIC PANEL
CO2: 28 mEq/L (ref 19–32)
Chloride: 98 mEq/L (ref 96–112)
GFR calc Af Amer: 44 mL/min — ABNORMAL LOW (ref >90)
Potassium: 3.3 mEq/L — ABNORMAL LOW (ref 3.5–5.1)

## 2012-03-02 LAB — CBC
HCT: 31.3 % — ABNORMAL LOW (ref 39.0–52.0)
Hemoglobin: 9.7 g/dL — ABNORMAL LOW (ref 13.0–17.0)
MCV: 79 fL (ref 78.0–100.0)
WBC: 7.9 10*3/uL (ref 4.0–10.5)

## 2012-03-02 LAB — PROTIME-INR
INR: 1.24 (ref 0.00–1.49)
Prothrombin Time: 15.4 seconds — ABNORMAL HIGH (ref 11.6–15.2)

## 2012-03-02 MED ORDER — POTASSIUM CHLORIDE CRYS ER 20 MEQ PO TBCR
40.0000 meq | EXTENDED_RELEASE_TABLET | Freq: Every day | ORAL | Status: DC
Start: 2012-03-02 — End: 2012-03-03
  Administered 2012-03-02: 40 meq via ORAL
  Filled 2012-03-02 (×2): qty 2

## 2012-03-02 MED ORDER — WARFARIN SODIUM 5 MG PO TABS
5.0000 mg | ORAL_TABLET | Freq: Once | ORAL | Status: AC
  Administered 2012-03-02: 5 mg via ORAL
  Filled 2012-03-02: qty 1

## 2012-03-02 MED ORDER — TECHNETIUM TC 99M SESTAMIBI GENERIC - CARDIOLITE
30.0000 | Freq: Once | INTRAVENOUS | Status: AC | PRN
  Administered 2012-03-02: 30 via INTRAVENOUS

## 2012-03-02 MED ORDER — HEPARIN (PORCINE) IN NACL 100-0.45 UNIT/ML-% IJ SOLN
1200.0000 [IU]/h | INTRAMUSCULAR | Status: DC
  Administered 2012-03-03: 1200 [IU]/h via INTRAVENOUS
  Filled 2012-03-02 (×2): qty 250

## 2012-03-02 MED ORDER — REGADENOSON 0.4 MG/5ML IV SOLN
0.4000 mg | Freq: Once | INTRAVENOUS | Status: AC
  Administered 2012-03-02: 0.4 mg via INTRAVENOUS

## 2012-03-02 MED ORDER — HEPARIN (PORCINE) IN NACL 100-0.45 UNIT/ML-% IJ SOLN
1500.0000 [IU]/h | INTRAMUSCULAR | Status: DC
  Administered 2012-03-02: 1500 [IU]/h via INTRAVENOUS
  Filled 2012-03-02 (×2): qty 250

## 2012-03-02 MED ORDER — TECHNETIUM TC 99M SESTAMIBI GENERIC - CARDIOLITE
10.0000 | Freq: Once | INTRAVENOUS | Status: AC | PRN
  Administered 2012-03-02: 10 via INTRAVENOUS

## 2012-03-02 MED ORDER — HEPARIN (PORCINE) IN NACL 100-0.45 UNIT/ML-% IJ SOLN
1700.0000 [IU]/h | INTRAMUSCULAR | Status: DC
  Administered 2012-03-02: 1700 [IU]/h via INTRAVENOUS
  Filled 2012-03-02 (×3): qty 250

## 2012-03-02 MED ORDER — REGADENOSON 0.4 MG/5ML IV SOLN
INTRAVENOUS | Status: AC
  Filled 2012-03-02: qty 5

## 2012-03-02 NOTE — Progress Notes (Signed)
TELEMETRY: Reviewed telemetry pt in NSR: Filed Vitals:   03/01/12 1600 03/01/12 1720 03/01/12 2235 03/02/12 0519  BP:  157/88 148/71 163/88  Pulse:  65 83 69  Temp: 97.9 F (36.6 C) 98.5 F (36.9 C) 99.2 F (37.3 C) 98.3 F (36.8 C)  TempSrc: Oral Oral Oral Oral  Resp:  16 16 16   Height:  5\' 10"  (1.778 m)    Weight:  174 lb 14.4 oz (79.334 kg)    SpO2:  100% 94% 93%    Intake/Output Summary (Last 24 hours) at 03/02/12 0901 Last data filed at 03/02/12 0548  Gross per 24 hour  Intake 726.71 ml  Output   1650 ml  Net -923.29 ml    SUBJECTIVE Feels much better. Denies SOB, chest pain or cough.  LABS: Basic Metabolic Panel:  Basename 03/02/12 0327 03/01/12 0425 02/29/12 0435  NA 138 140 --  K 3.3* 3.6 --  CL 98 102 --  CO2 28 30 --  GLUCOSE 112* 88 --  BUN 34* 29* --  CREATININE 1.76* 1.61* --  CALCIUM 8.7 8.6 --  MG -- -- 2.1  PHOS -- -- 2.3   CBC:  Basename 03/02/12 0327 03/01/12 0425  WBC 7.9 9.2  NEUTROABS -- --  HGB 9.7* 9.2*  HCT 31.3* 30.5*  MCV 79.0 79.6  PLT 216 212   Cardiac Enzymes:  Basename 02/29/12 1120 02/29/12 0435 02/28/12 2230  CKTOTAL -- -- --  CKMB -- -- --  CKMBINDEX -- -- --  TROPONINI 2.42* 1.78* 2.64*    Radiology/Studies:  Dg Chest Port 1 View  02/29/2012  *RADIOLOGY REPORT*  Clinical Data: Evaluate endotracheal tube and lines.  PORTABLE CHEST - 1 VIEW  Comparison: 02/28/2012  Findings: Endotracheal tube tip measures about 5.8 cm above the carina.  Enteric tube tip is not visualized but is below the left hemidiaphragm consistent location in the distal to the stomach. Right central venous catheter is unchanged with tip overlying the mid SVC region.  Cardiac enlargement.  Pulmonary vascular congestion and perihilar infiltration appear to be improving. Probable small bilateral pleural effusions.  No pneumothorax.  IMPRESSION: Appliances remain unchanged in satisfactory apparent location. Perihilar infiltrates appear to be improving.    Original Report Authenticated By: Burman Nieves, M.D.    Portable Chest Xray In Am  02/28/2012  *RADIOLOGY REPORT*  Clinical Data: ARDS.  PORTABLE CHEST - 1 VIEW  Comparison: 02/27/2012  Findings: Endotracheal tube tip measures 5.4 cm above the carina. Enteric tube tip is not visible but is below the left hemidiaphragm consistent with location or distal to the stomach.  Right central venous catheter with tip  over the upper SVC region.  Cardiac enlargement with pulmonary vascular congestion and perihilar infiltration suggesting edema or ARDS.  Probable small pleural effusions.  No pneumothorax. No definite change since previous study.  IMPRESSION: Appliances appear in satisfactory position.  Cardiac enlargement with pulmonary vascular congestion and small bilateral pleural effusions.  Bilateral perihilar infiltration may represent edema or ARDS.  No significant change.   Original Report Authenticated By: Burman Nieves, M.D.    Dg Chest Portable 1 View  02/27/2012  *RADIOLOGY REPORT*  Clinical Data: Shortness of breath and fatigue.  PORTABLE CHEST - 1 VIEW  Comparison: Two-view chest 12/04/2011.  Findings: The heart is mildly enlarged.  Pulmonary vascular congestion and mild edema has increased, somewhat worse right than left.  Underlying airspace disease is also present on the right. The visualized soft tissues and bony thorax are unremarkable.  IMPRESSION:  1.  Borderline cardiomegaly with increasing interstitial edema and pulmonary vascular congestion compatible with congestive heart failure. 2.  Progressive right middle or right lower lobe airspace disease may represent atelectasis, but is worrisome for infection.   Original Report Authenticated By: Marin Roberts, M.D.    Dg Chest Port 1v Same Day  02/27/2012  *RADIOLOGY REPORT*  Clinical Data: Right IJ line placement.  PORTABLE CHEST - 1 VIEW SAME DAY  Comparison: 02/27/2012  Findings: Cardiomegaly with bilateral airspace opacities, most  compatible with edema.  This appears to worsened slightly since prior study.  Endotracheal tube is 6 cm above the carina.  Right central line tip is in the SVC.  No pneumothorax.  No definite effusions.  IMPRESSION: Support devices as above.  No pneumothorax.  Mild worsening in bilateral airspace disease, presumably worsening edema/CHF.   Original Report Authenticated By: Charlett Nose, M.D.    ECG:27-Feb-2012 16:10:96 [facility] ICU ROUTINE RECORD Normal sinus rhythm Possible Left atrial enlargement Left ventricular hypertrophy with QRS widening and repolarization abnormality   Transthoracic Echocardiography  Patient: Jorge, Retz MR #: [id number] Study Date: 02/28/2012 Gender: M Age: 67 Height: 177.8cm Weight: 79.5kg BSA: 1.68m^2 Pt. Status: Room: RESA  PERFORMING [place], [facility] ADMITTING Rory Percy ATTENDING Rory Percy SONOGRAPHER Nolon Rod, RDCS ORDERING Minor, Celesta Aver Minor, Chrissie Noa cc:  ------------------------------------------------------------ LV EF: 25%  ------------------------------------------------------------ Indications: CHF - 428.0.  ------------------------------------------------------------ History: PMH: Coronary artery disease. Congestive heart failure. Risk factors: Hypertension. Hypercholesterolemia.  ------------------------------------------------------------ Study Conclusions  - Left ventricle: The cavity size was mildly dilated. Wall thickness was increased in a pattern of mild LVH. The estimated ejection fraction was 25%. Diffuse hypokinesis. Doppler parameters are consistent with restrictive physiology, indicative of decreased left ventricular diastolic compliance and/or increased left atrial pressure. E/medial e' > 15 suggesting LV end diastolic pressure > 20 mmHg. - Aortic valve: Trileaflet; moderately calcified leaflets. There was no stenosis. Trivial regurgitation. - Mitral valve:  Moderately calcified annulus. The posterior leaflet is calcified with restricted excursion. Moderate regurgitation. - Left atrium: The atrium was moderately dilated. - Right ventricle: The cavity size was normal. Systolic function was normal. - Right atrium: The atrium was moderately dilated. - Tricuspid valve: Mild-moderate regurgitation. Peak RV-RA gradient: 41mm Hg (S). - Pulmonary arteries: PA systolic pressure 52-57 mmHg. - Systemic veins: IVC measured 2.2 cm with < 50% respirophasic variation, suggesting RA pressure 11-15 mmHg. - Pericardium, extracardiac: A trivial pericardial effusion was identified. Impressions:  - Mildly dilated LV with mild LV hypertrophy. EF 25% with diffuse hypokinesis. Restrictive diastolic function with evidence for elevated LV filling pressure. Normal RV size and systolic function. Moderate pulmonary hypertension. Moderate mitral regurgitation with restriction and calcification of posterior leaflet. Transthoracic echocardiography. M-mode, complete 2D, spectral Doppler, and color Doppler. Height: Height: 177.8cm. Height: 70in. Weight: Weight: 79.5kg. Weight: 175lb. Body mass index: BMI: 25.2kg/m^2. Body surface area: BSA: 1.78m^2. Blood pressure: 142/79. Patient status: Inpatient. Location: ICU/CCU  ------------------------------------------------------------  ------------------------------------------------------------ Left ventricle: The cavity size was mildly dilated. Wall thickness was increased in a pattern of mild LVH. The estimated ejection fraction was 25%. Diffuse hypokinesis. Doppler parameters are consistent with restrictive physiology, indicative of decreased left ventricular diastolic compliance and/or increased left atrial pressure. E/medial e' > 15 suggesting LV end diastolic pressure > 20 mmHg.  ------------------------------------------------------------ Aortic valve: Trileaflet; moderately calcified leaflets. Doppler: There  was no stenosis. Trivial regurgitation.  ------------------------------------------------------------ Aorta: Aortic root: The aortic root was normal in size. Ascending aorta: The ascending aorta was normal  in size.  ------------------------------------------------------------ Mitral valve: Moderately calcified annulus. The posterior leaflet is calcified with restricted excursion. Doppler: There was no evidence for stenosis. Moderate regurgitation. Valve area by pressure half-time: 5.25cm^2. Indexed valve area by pressure half-time: 2.64cm^2/m^2. Mean gradient: 3mm Hg (D). Peak gradient: 8mm Hg (D).  ------------------------------------------------------------ Left atrium: The atrium was moderately dilated.  ------------------------------------------------------------ Right ventricle: The cavity size was normal. Systolic function was normal.  ------------------------------------------------------------ Pulmonic valve: Structurally normal valve. Cusp separation was normal. Doppler: Transvalvular velocity was within the normal range. Trivial regurgitation.  ------------------------------------------------------------ Tricuspid valve: Doppler: Mild-moderate regurgitation.  ------------------------------------------------------------ Pulmonary artery: PA systolic pressure 52-57 mmHg.  ------------------------------------------------------------ Right atrium: The atrium was moderately dilated.  ------------------------------------------------------------ Pericardium: A trivial pericardial effusion was identified.  ------------------------------------------------------------ Systemic veins: IVC measured 2.2 cm with < 50% respirophasic variation, suggesting RA pressure 11-15 mmHg.  ------------------------------------------------------------  2D measurements Normal Doppler measurements Norma Left ventricle l LVID ED, 59 mm 43-52 Left ventricle chord, Ea, lat 7.12 cm/s  ----- PLAX ann, tiss LVID ES, 53 mm 23-38 DP chord, E/Ea, lat 19.34 ----- PLAX ann, tiss FS, chord, 10 % >29 DP PLAX Ea, med 4.19 cm/s ----- LVPW, ED 14 mm ------ ann, tiss IVS/LVPW 1 <1.3 DP ratio, ED E/Ea, med 32.87 ----- Ventricular septum ann, tiss IVS, ED 14 mm ------ DP LVOT Mitral valve Diam, S 27 mm ------ Peak E 137.71 cm/s ----- Area 5.73 cm^2 ------ vel Aorta Mean vel, 69.4 cm/s ----- Root diam, 31 mm ------ D ED Decelerat 953.92 cm/s^2 ----- Left atrium ion slope AP dim 55 mm ------ Decelerat 144 ms 150-2 AP dim 2.76 cm/m^2 <2.2 ion time 30 index Pressure 42 ms ----- half-time Mean 3 mm Hg ----- gradient, D Peak 8 mm Hg ----- gradient, D Area 5.25 cm^2 ----- (PHT) Area 2.64 cm^2/m ----- index ^2 (PHT) Annulus 35.6 cm ----- VTI Max 493 cm/s ----- regurg vel Regurg 177 cm ----- VTI Tricuspid valve Regurg 322 cm/s ----- peak vel Peak 41 mm Hg ----- RV-RA gradient, S Pulmonic valve Regurg 111 cm/s ----- vel, ED  ------------------------------------------------------------ Prepared and Electronically Authenticated by  Marca Ancona [id number]:[id number]  PHYSICAL EXAM General: Well developed, well nourished, in no acute distress. Head: Normocephalic, atraumatic, sclera non-icteric, no xanthomas, nares are without discharge. Neck: Negative for carotid bruits. JVD not elevated. Lungs: Clear bilaterally to auscultation without wheezes, rales, or rhonchi. Breathing is unlabored. Heart: RRR S1 S2 without murmurs, rubs, or gallops.  Abdomen: Soft, non-tender, non-distended with normoactive bowel sounds. No hepatomegaly. No rebound/guarding. No obvious abdominal masses. Msk:  Strength and tone appears normal for age. Extremities: No clubbing, cyanosis or edema.  Distal pedal pulses are 2+ and equal bilaterally. Neuro: Alert and oriented X 3. Moves all extremities spontaneously. Psych:  Responds to questions appropriately with a normal  affect.  ASSESSMENT AND PLAN: 1. Acute on chronic systolic CHF- diuresing well. Weight down 6 lbs since admit. Edema resolved. Lungs clear. On po lasix. Continue carvedilol, hydralazine, nitrates. Hold ARB until creatinine stablilzed. Patient considered previously for biV/ICD but EP wanted to reassess LV function 3 months post restoration of NSR to see if it would improve. This would be the first of April.  2. 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  3. Hypokalemia-replete  4. NSTEMI. Probably demand due to recent CHF exacerbation. For lexiscan myoview today.  5. CAD s/p stenting of LAD and intermediate vessels in 3/12 with DES. LAD was restenosis of prior BMS in 2000. Severe residual disease  in diagonal and PDA that were too small and diffusely diseased for PCI.  6. Atrial fibrillation- maintaining NSR on amiodarone. S/p DCCV  01/06/12 and 02/13/12.  7. HTN severe long standing HTN. Will resume ARB when creatinine stable.   8. History of CVA  9. Hyperlipidemia- on lipitor  10. Iron deficiency anemia.  11. Chronic anticoagulation- on IV heparin. Coumadin resumed. INR 1.24 today. Per pharmacy.  12. CKD stage 3.  Active Problems:  HYPERTENSION  CAD (coronary artery disease)  HTN (hypertension)  CKD (chronic kidney disease) stage 3, GFR 30-59 ml/min  S/P coronary artery stent placement  Heart failure, acute on chronic, systolic and diastolic  Atrial fibrillation  SOB (shortness of breath)  ARDS (adult respiratory distress syndrome)  Respiratory failure  Acute myocardial infarction, subendocardial infarction, initial episode of care    Signed, Eldin Bonsell [place] MD,FACC 03/02/2012 9:21 AM

## 2012-03-02 NOTE — Care Management Note (Signed)
    Page 1 of 2   03/03/2012     2:15:47 PM   CARE MANAGEMENT NOTE 03/03/2012  Patient:  Erik Adams, Erik Adams   Account Number:  [id number]  Date Initiated:  02/28/2012  Documentation initiated by:  DAVIS,RHONDA  Subjective/Objective Assessment:   pt with hypoxia requiring mach. vent     Action/Plan:   from home   Anticipated DC Date:  03/03/2012   Anticipated DC Plan:  HOME/SELF CARE      DC Planning Services  Medication Assistance      Choice offered to / List presented to:             Status of service:  Completed, signed off Medicare Important Message given?   (If response is "NO", the following Medicare IM given date fields will be blank) Date Medicare IM given:   Date Additional Medicare IM given:    Discharge Disposition:  HOME/SELF CARE  Per UR Regulation:  Reviewed for med. necessity/level of care/duration of stay  If discussed at Long Length of Stay Meetings, dates discussed:    Comments:  03/03/12 Arita Severtson RN,BSN NCM [phone] PATIENT HAS ELECTED NOT TO RECEIVE PRESCRIPTION COVERAGE,THEREFORE HE ACCEPTS THE OBLIGATION TO PAY OUT OF POCKET FOR MEDS.HE SAYS HE CAN AFFORD HIS LOVENOX.HE COMPREHENDED.ENCOURAGED TO CONTACT CUST SERV[employer] & DISCUSS PRESCRIPTION COVERAGE.PROVIDED W/DISCOUNT CARD.TC [facility] SPOKE TO KIM(HIS DAUGHTER)WHO IS A PHARMACIST,SHE UNDERSTOOD,& ALSO INFORMED HIM THAT IF HE DID NOT ELECT FOR COVERAGE,HE WOULD HAVE TO PAY FOR HIS MEDS.COST OF LOVENOX-$432.00,HE CAN AFFORD IT.HE WILL GET IT @ [facility],THEY HAVE IT AVAILABLE.  03/02/12 Ednah Hammock RN,BSN,NCM [phone] MC FOR STRESS TEST TODAY.  [id number] Earlene Plater, RN, BSN, CCM:  CHART REVIEWED AND UPDATED.  Next chart review due on [id number]. NO DISCHARGE NEEDS PRESENT AT THIS TIME. CASE MANAGEMENT [phone]

## 2012-03-02 NOTE — Progress Notes (Addendum)
ANTICOAGULATION CONSULT NOTE - Follow Up Consult  Pharmacy Consult for Warfarin and IV Heparin Indication: atrial fib s/p cardioversion, L atrial thrombus, NSTEMI  Labs:  Basename 03/02/12 2137 03/02/12 1356 03/02/12 0327 03/01/12 0815 03/01/12 0425 02/29/12 1120 02/29/12 0435  HGB -- -- 9.7* -- 9.2* -- --  HCT -- -- 31.3* -- 30.5* -- 30.0*  PLT -- -- 216 -- 212 -- 192  APTT -- -- -- -- -- -- --  LABPROT -- -- 15.4* 17.1* -- -- 21.5*  INR -- -- 1.24 1.43 -- -- 1.95*  HEPARINUNFRC 0.92* 0.87* <0.10* -- -- -- --  CREATININE -- -- 1.76* -- 1.61* -- 1.60*  CKTOTAL -- -- -- -- -- -- --  CKMB -- -- -- -- -- -- --  TROPONINI -- -- -- -- -- 2.42* 1.78*   Estimated Creatinine Clearance: 42.1 ml/min (by C-G formula based on Cr of 1.76).  Assessment:  67 yo M with refractory A.fib on Coumadin PTA.  IV heparin was started 1/17 but stopped later that day due to blood in urine.  Heparin was resumed 1/19.  Home warfarin dose reported as 2.5mg  on Mon, 5mg  on all other days. Last taken 1/15 prior to admission.  INR subtherapeutic today (1.24) - expected after not receiving warfarin from 1/16-1/18. Per night shift RN, urine has been yellow, no pink tinge at all since 3am. HL high x2, no problems with bleeding or IV interuptions per RN.  Goal of Therapy:  Heparin level 0.3 - 0.7 (aim for low end of therapeutic) Monitor platelets by anticoagulation protocol: Yes   Plan:   Decrease heparin drip to 1200 units/hr  Recheck HL 0600 1/21  F/U recurrence of hematuria  Lorenza Evangelist 03/02/2012,11:06 PM.   Assessement:  HL= 0.56 units/hr  No problems reported per RN  Plan:  Continue Heparin @ 1200 units/hr  Recheck HL today to ensure stays therapeutic.  Lorenza Evangelist 03/03/2012 7:16 AM

## 2012-03-02 NOTE — Progress Notes (Signed)
Nuc results as below. Fixed defect without significant ischemia. IMPRESSION: 1. LV systolic dysfunction with EF 32% and inferior/inferolateral hypokinesis. 2. Fixed medium-sized, moderate basal inferior/inferolateral/inferoseptal and mid inferolateral perfusion defect suggestive of prior myocardial infarction without significant ischemia. 3. Intermediate risk study given depressed EF. Further plans per rounding MD in AM. Results briefly discussed with pt via telephone.  Kue Fox PA-C

## 2012-03-02 NOTE — Progress Notes (Signed)
PULMONARY  / CRITICAL CARE MEDICINE  Name: Erik Adams MRN: [id number] DOB: 08-Dec-1945    LOS: 4  REFERRING MD :  EDP  PATIENT SUMMARY:  55 h/o cad/chf ef 20% presents Hypoxia/dyspena/fever, RML > L sided infiltrates, VDRF. Positive troponin, started on heparin, ASA, b-blockade 1/16.   SIGNIFICANT EVENTS:   1/16 positive troponin, heparin started 1/16 TTE: EF 25% 1/18 Heparin held due to hematuria. Foley D/C'd 1/20 NM perfusion>>>  LINES / TUBES: 1-16 et>> 1/18 1-16 rt cvl>>> 1/19  CULTURES: 1-16 bc x 2>>  1-16 uc>> 1-16 sputum>> 1-16 flu panel>>  ANTIBIOTICS: 1-16 vanc>> 1/19 1-16 zoysn>> 1/16 1/16 ceftriaxone >> 1/19  1-16 tamiflu>> 1/19 1/16 levofloxacin>> 1/19  INTERVAL HISTORY:  Tolerating extubation. No distress. No complaints.  VITAL SIGNS: Temp:  [97.9 F (36.6 C)-99.2 F (37.3 C)] 98.3 F (36.8 C) (01/20 0519) Pulse Rate:  [64-83] 70  (01/20 1115) Resp:  [14-16] 16  (01/20 0519) BP: (137-164)/(68-91) 142/68 mmHg (01/20 1115) SpO2:  [93 %-100 %] 93 % (01/20 0519) Weight:  [79.334 kg (174 lb 14.4 oz)] 79.334 kg (174 lb 14.4 oz) (01/19 1720) Room air   Intake/Output Summary (Last 24 hours) at 03/02/12 1122 Last data filed at 03/02/12 0548  Gross per 24 hour  Intake 726.71 ml  Output   1600 ml  Net -873.29 ml     PHYSICAL EXAMINATION: General: NAD Neuro: Intact HEENT: WNL Cardiovascular: RRR s M Lungs: Clear anteriorly Abdomen: NABS, soft Ext: nno edema  LABS: Cbc  Lab 03/02/12 0327 03/01/12 0425 02/29/12 0435  WBC 7.9 -- --  HGB 9.7* 9.2* 9.2*  HCT 31.3* 30.5* 30.0*  PLT 216 212 192    Chemistry  Lab 03/02/12 0327 03/01/12 0425 02/29/12 0435 02/27/12 1630  NA 138 140 139 --  K 3.3* 3.6 3.3* --  CL 98 102 101 --  CO2 28 30 30  --  BUN 34* 29* 29* --  CREATININE 1.76* 1.61* 1.60* --  CALCIUM 8.7 8.6 8.5 --  MG -- -- 2.1 2.1  PHOS -- -- 2.3 3.9  GLUCOSE 112* 88 112* --   BNP  Lab 02/29/12 0435 02/27/12 1345  PROBNP  6251.0* 18223.0*   CBG trend  Lab 02/27/12 1346  GLUCAP 136*    IMAGING: No new CXR  ECG: No new  DIAGNOSES: Active Problems:  HYPERTENSION  CAD (coronary artery disease)  HTN (hypertension)  CKD (chronic kidney disease) stage 3, GFR 30-59 ml/min  S/P coronary artery stent placement  Heart failure, acute on chronic, systolic and diastolic  Atrial fibrillation  SOB (shortness of breath)  ARDS (adult respiratory distress syndrome)  Respiratory failure  Acute myocardial infarction, subendocardial infarction, initial episode of care   ASSESSMENT / PLAN:  Acute resp failure, suspect mostly due to pulmonary edema. Little evidence to support dx of PNA Pulmonary edema,  Doubt influenza  D/C'd Ceftrx and oseltamivir as per dashboard Plan F/u cxr in am   Htn Afib >> NSR NSTEMI CHF  Severe CM (EF 25%) PLAN:  Mgmt per Cards Awaiting final recs.   ARF, likely ATN, scr now holding at baseline in 1.6-1.7 range.  Hematuria, resolved Recent Labs  Basename 03/02/12 0327 03/01/12 0425 02/29/12 0435   CREATININE 1.76* 1.61* 1.60*  PLAN:   -monitor renal panel intermittently -Agree with resumption of anticoagulation  Coumadin use for A Fib Lab Results  Component Value Date   INR 1.24 03/02/2012   INR 1.43 03/01/2012   INR 1.95* 02/29/2012  PLAN:  -  Cont anticoagulation   Prob home first thing 1/21 depending on cards recs.   Sandrea Hughs, MD Pulmonary and Critical Care Medicine [facility] Cell [phone] After 5:30 PM or weekends, call [phone]

## 2012-03-02 NOTE — Progress Notes (Signed)
Patient has been monitored while in Nuclear Medicine. Sinus rhythm 60's, no ectopics.

## 2012-03-02 NOTE — Progress Notes (Signed)
ANTICOAGULATION CONSULT NOTE - Follow Up Consult  Pharmacy Consult for IV Heparin Indication: atrial fib s/p cardioversion, L atrial thrombus, NSTEMI  Labs:  Basename 03/02/12 0327 03/01/12 1830 03/01/12 0815 03/01/12 0425 02/29/12 1120 02/29/12 0435 02/28/12 2230 02/28/12 1700  HGB 9.7* -- -- 9.2* -- -- -- --  HCT 31.3* -- -- 30.5* -- 30.0* -- --  PLT 216 -- -- 212 -- 192 -- --  APTT -- -- -- -- -- -- -- --  LABPROT 15.4* -- 17.1* -- -- 21.5* -- --  INR 1.24 -- 1.43 -- -- 1.95* -- --  HEPARINUNFRC <0.10* 0.13* -- -- -- -- -- 0.10*  CREATININE 1.76* -- -- 1.61* -- 1.60* -- --  CKTOTAL -- -- -- -- -- -- -- --  CKMB -- -- -- -- -- -- -- --  TROPONINI -- -- -- -- 2.42* 1.78* 2.64* --   Estimated Creatinine Clearance: 42.1 ml/min (by C-G formula based on Cr of 1.76).  Assessment:  72 yom with refractory A.fib on Coumadin PTA.  IV heparin was started 1/17 but stopped later that day due to blood in urine.    INR today 1.43 so IV heparin and Coumadin were resumed.   Heparin level is below target range. Per RN since 0300 urine has been yellow, no pink tinge at all. No IV interuptions per RN.  Goal of Therapy:  Heparin level 0.3 - 0.7 (aim for low end of therapeutic) Monitor platelets by anticoagulation protocol: Yes   Plan:   Increase IV heparin to 1700 units/hr.  Recheck HL in 6 hours  Will f/u closely. Susanne Greenhouse R . 03/02/2012,5:45 AM.

## 2012-03-02 NOTE — Progress Notes (Addendum)
ANTICOAGULATION CONSULT NOTE - Follow Up Consult  Pharmacy Consult for Warfarin and IV Heparin Indication: atrial fib s/p cardioversion, L atrial thrombus, NSTEMI  Labs:  Basename 03/02/12 0327 03/01/12 1830 03/01/12 0815 03/01/12 0425 02/29/12 1120 02/29/12 0435 02/28/12 2230 02/28/12 1700  HGB 9.7* -- -- 9.2* -- -- -- --  HCT 31.3* -- -- 30.5* -- 30.0* -- --  PLT 216 -- -- 212 -- 192 -- --  APTT -- -- -- -- -- -- -- --  LABPROT 15.4* -- 17.1* -- -- 21.5* -- --  INR 1.24 -- 1.43 -- -- 1.95* -- --  HEPARINUNFRC <0.10* 0.13* -- -- -- -- -- 0.10*  CREATININE 1.76* -- -- 1.61* -- 1.60* -- --  CKTOTAL -- -- -- -- -- -- -- --  CKMB -- -- -- -- -- -- -- --  TROPONINI -- -- -- -- 2.42* 1.78* 2.64* --   Estimated Creatinine Clearance: 42.1 ml/min (by C-G formula based on Cr of 1.76).  Assessment:  67 yo M with refractory A.fib on Coumadin PTA.  IV heparin was started 1/17 but stopped later that day due to blood in urine.  Heparin was resumed 1/19.  Home warfarin dose reported as 2.5mg  on Mon, 5mg  on all other days. Last taken 1/15 prior to admission.  INR subtherapeutic today (1.24) - expected after not receiving warfarin from 1/16-1/18. Per night shift RN, urine has been yellow, no pink tinge at all since 3am. Heparin level scheduled for 12:00 noon today, 6 hours after rate change earlier today  Goal of Therapy:  Heparin level 0.3 - 0.7 (aim for low end of therapeutic) Monitor platelets by anticoagulation protocol: Yes   Plan:   Warfarin 5mg  PO x1 at 18:00  Will f/u heparin level at noon today (goal 0.3-0.7)  F/U recurrence of hematuria  Darrol Angel, PharmD Pager: [phone] 03/02/2012,10:12 AM.   Patient now back from [facility]. HL drawn at 14:00 supratherapeutic (0.87). No bleeding reported by RN. Will decrease heparin from 1700 units/hr to 1500 units/hr and recheck heparin level in 6 hours.  Darrol Angel, PharmD Pager: [phone] 03/02/2012 2:56 PM

## 2012-03-02 NOTE — Progress Notes (Signed)
Lexiscan myoview performed without complication. Await images. Abdulkadir Emmanuel PA-C

## 2012-03-03 ENCOUNTER — Telehealth: Payer: Self-pay | Admitting: Cardiology

## 2012-03-03 DIAGNOSIS — R0602 Shortness of breath: Secondary | ICD-10-CM

## 2012-03-03 LAB — BASIC METABOLIC PANEL
Chloride: 98 mEq/L (ref 96–112)
GFR calc Af Amer: 48 mL/min — ABNORMAL LOW (ref >90)
GFR calc non Af Amer: 41 mL/min — ABNORMAL LOW (ref >90)
Potassium: 3.1 mEq/L — ABNORMAL LOW (ref 3.5–5.1)
Sodium: 136 mEq/L (ref 135–145)

## 2012-03-03 LAB — HEPARIN LEVEL (UNFRACTIONATED): Heparin Unfractionated: 0.56 IU/mL (ref 0.30–0.70)

## 2012-03-03 LAB — PROTIME-INR
INR: 1.23 (ref 0.00–1.49)
Prothrombin Time: 15.3 seconds — ABNORMAL HIGH (ref 11.6–15.2)

## 2012-03-03 MED ORDER — ATORVASTATIN CALCIUM 20 MG PO TABS: 20.0000 mg | ORAL_TABLET | Freq: Every day | ORAL | Status: DC

## 2012-03-03 MED ORDER — ISOSORBIDE DINITRATE 20 MG PO TABS: 20.0000 mg | ORAL_TABLET | Freq: Three times a day (TID) | ORAL | Status: DC

## 2012-03-03 MED ORDER — ENOXAPARIN SODIUM 80 MG/0.8ML ~~LOC~~ SOLN: 70.0000 mg | Freq: Two times a day (BID) | SUBCUTANEOUS | Status: DC

## 2012-03-03 MED ORDER — ENOXAPARIN SODIUM 80 MG/0.8ML ~~LOC~~ SOLN
70.0000 mg | SUBCUTANEOUS | Status: AC
  Administered 2012-03-03: 70 mg via SUBCUTANEOUS
  Filled 2012-03-03: qty 0.8

## 2012-03-03 MED ORDER — POTASSIUM CHLORIDE CRYS ER 20 MEQ PO TBCR
60.0000 meq | EXTENDED_RELEASE_TABLET | Freq: Every day | ORAL | Status: DC
  Administered 2012-03-03: 60 meq via ORAL
  Filled 2012-03-03: qty 3

## 2012-03-03 MED ORDER — FUROSEMIDE 40 MG PO TABS: 120.0000 mg | ORAL_TABLET | Freq: Every morning | ORAL | Status: DC

## 2012-03-03 MED ORDER — ASPIRIN 81 MG PO CHEW: 81.0000 mg | CHEWABLE_TABLET | Freq: Every day | ORAL | Status: AC

## 2012-03-03 NOTE — Telephone Encounter (Signed)
Pt in [place], and dr [place] came by and told him he could have a lovanox bridge and be d/c from hospital and that's what he want to do, said he was told to call the office pt number [phone]

## 2012-03-03 NOTE — Telephone Encounter (Signed)
Pt being D/C today on coumadin and lovenox follow up appt made for 03/06/12.

## 2012-03-03 NOTE — Progress Notes (Signed)
TELEMETRY: Reviewed telemetry pt in NSR: Filed Vitals:   03/02/12 1336 03/02/12 2133 03/03/12 0500 03/03/12 0610  BP: 154/81 152/81  165/85  Pulse: 71 63  71  Temp: 97.8 F (36.6 C) 98.6 F (37 C)  98 F (36.7 C)  TempSrc: Oral Oral  Oral  Resp: 18 18  18   Height:      Weight:   159 lb (72.122 kg)   SpO2: 94% 96%  97%    Intake/Output Summary (Last 24 hours) at 03/03/12 0843 Last data filed at 03/03/12 0650  Gross per 24 hour  Intake 1730.52 ml  Output   1740 ml  Net  -9.48 ml    SUBJECTIVE Feels much better. Denies SOB, chest pain or cough.  LABS: Basic Metabolic Panel:  Basename 03/03/12 0530 03/02/12 0327  NA 136 138  K 3.1* 3.3*  CL 98 98  CO2 27 28  GLUCOSE 99 112*  BUN 26* 34*  CREATININE 1.66* 1.76*  CALCIUM 8.9 8.7  MG -- --  PHOS -- --   CBC:  Basename 03/02/12 0327 03/01/12 0425  WBC 7.9 9.2  NEUTROABS -- --  HGB 9.7* 9.2*  HCT 31.3* 30.5*  MCV 79.0 79.6  PLT 216 212   Cardiac Enzymes:  Basename 02/29/12 1120  CKTOTAL --  CKMB --  CKMBINDEX --  TROPONINI 2.42*    Radiology/Studies:  Dg Chest Port 1 View  02/29/2012  *RADIOLOGY REPORT*  Clinical Data: Evaluate endotracheal tube and lines.  PORTABLE CHEST - 1 VIEW  Comparison: 02/28/2012  Findings: Endotracheal tube tip measures about 5.8 cm above the carina.  Enteric tube tip is not visualized but is below the left hemidiaphragm consistent location in the distal to the stomach. Right central venous catheter is unchanged with tip overlying the mid SVC region.  Cardiac enlargement.  Pulmonary vascular congestion and perihilar infiltration appear to be improving. Probable small bilateral pleural effusions.  No pneumothorax.  IMPRESSION: Appliances remain unchanged in satisfactory apparent location. Perihilar infiltrates appear to be improving.   Original Report Authenticated By: Burman Nieves, M.D.    Portable Chest Xray In Am  02/28/2012  *RADIOLOGY REPORT*  Clinical Data: ARDS.  PORTABLE  CHEST - 1 VIEW  Comparison: 02/27/2012  Findings: Endotracheal tube tip measures 5.4 cm above the carina. Enteric tube tip is not visible but is below the left hemidiaphragm consistent with location or distal to the stomach.  Right central venous catheter with tip  over the upper SVC region.  Cardiac enlargement with pulmonary vascular congestion and perihilar infiltration suggesting edema or ARDS.  Probable small pleural effusions.  No pneumothorax. No definite change since previous study.  IMPRESSION: Appliances appear in satisfactory position.  Cardiac enlargement with pulmonary vascular congestion and small bilateral pleural effusions.  Bilateral perihilar infiltration may represent edema or ARDS.  No significant change.   Original Report Authenticated By: Burman Nieves, M.D.    Dg Chest Portable 1 View  02/27/2012  *RADIOLOGY REPORT*  Clinical Data: Shortness of breath and fatigue.  PORTABLE CHEST - 1 VIEW  Comparison: Two-view chest 12/04/2011.  Findings: The heart is mildly enlarged.  Pulmonary vascular congestion and mild edema has increased, somewhat worse right than left.  Underlying airspace disease is also present on the right. The visualized soft tissues and bony thorax are unremarkable.  IMPRESSION:  1.  Borderline cardiomegaly with increasing interstitial edema and pulmonary vascular congestion compatible with congestive heart failure. 2.  Progressive right middle or right lower lobe airspace disease  may represent atelectasis, but is worrisome for infection.   Original Report Authenticated By: Marin Roberts, M.D.    Dg Chest Port 1v Same Day  02/27/2012  *RADIOLOGY REPORT*  Clinical Data: Right IJ line placement.  PORTABLE CHEST - 1 VIEW SAME DAY  Comparison: 02/27/2012  Findings: Cardiomegaly with bilateral airspace opacities, most compatible with edema.  This appears to worsened slightly since prior study.  Endotracheal tube is 6 cm above the carina.  Right central line tip is in the  SVC.  No pneumothorax.  No definite effusions.  IMPRESSION: Support devices as above.  No pneumothorax.  Mild worsening in bilateral airspace disease, presumably worsening edema/CHF.   Original Report Authenticated By: Charlett Nose, M.D.    ECG:27-Feb-2012 16:10:96 [facility] ICU ROUTINE RECORD Normal sinus rhythm Possible Left atrial enlargement Left ventricular hypertrophy with QRS widening and repolarization abnormality   Transthoracic Echocardiography  Patient: Romin, Divita MR #: [id number] Study Date: 02/28/2012 Gender: M Age: 38 Height: 177.8cm Weight: 79.5kg BSA: 1.22m^2 Pt. Status: Room: RESA  PERFORMING [place] ADMITTING Rory Percy ATTENDING Rory Percy SONOGRAPHER Nolon Rod, RDCS ORDERING Minor, Celesta Aver Minor, Chrissie Noa cc:  ------------------------------------------------------------ LV EF: 25%  ------------------------------------------------------------ Indications: CHF - 428.0.  ------------------------------------------------------------ History: PMH: Coronary artery disease. Congestive heart failure. Risk factors: Hypertension. Hypercholesterolemia.  ------------------------------------------------------------ Study Conclusions  - Left ventricle: The cavity size was mildly dilated. Wall thickness was increased in a pattern of mild LVH. The estimated ejection fraction was 25%. Diffuse hypokinesis. Doppler parameters are consistent with restrictive physiology, indicative of decreased left ventricular diastolic compliance and/or increased left atrial pressure. E/medial e' > 15 suggesting LV end diastolic pressure > 20 mmHg. - Aortic valve: Trileaflet; moderately calcified leaflets. There was no stenosis. Trivial regurgitation. - Mitral valve: Moderately calcified annulus. The posterior leaflet is calcified with restricted excursion. Moderate regurgitation. - Left atrium: The atrium was moderately  dilated. - Right ventricle: The cavity size was normal. Systolic function was normal. - Right atrium: The atrium was moderately dilated. - Tricuspid valve: Mild-moderate regurgitation. Peak RV-RA gradient: 41mm Hg (S). - Pulmonary arteries: PA systolic pressure 52-57 mmHg. - Systemic veins: IVC measured 2.2 cm with < 50% respirophasic variation, suggesting RA pressure 11-15 mmHg. - Pericardium, extracardiac: A trivial pericardial effusion was identified. Impressions:  - Mildly dilated LV with mild LV hypertrophy. EF 25% with diffuse hypokinesis. Restrictive diastolic function with evidence for elevated LV filling pressure. Normal RV size and systolic function. Moderate pulmonary hypertension. Moderate mitral regurgitation with restriction and calcification of posterior leaflet. Transthoracic echocardiography. M-mode, complete 2D, spectral Doppler, and color Doppler. Height: Height: 177.8cm. Height: 70in. Weight: Weight: 79.5kg. Weight: 175lb. Body mass index: BMI: 25.2kg/m^2. Body surface area: BSA: 1.66m^2. Blood pressure: 142/79. Patient status: Inpatient. Location: ICU/CCU  ------------------------------------------------------------  ------------------------------------------------------------ Left ventricle: The cavity size was mildly dilated. Wall thickness was increased in a pattern of mild LVH. The estimated ejection fraction was 25%. Diffuse hypokinesis. Doppler parameters are consistent with restrictive physiology, indicative of decreased left ventricular diastolic compliance and/or increased left atrial pressure. E/medial e' > 15 suggesting LV end diastolic pressure > 20 mmHg.  ------------------------------------------------------------ Aortic valve: Trileaflet; moderately calcified leaflets. Doppler: There was no stenosis. Trivial regurgitation.  ------------------------------------------------------------ Aorta: Aortic root: The aortic root was normal in  size. Ascending aorta: The ascending aorta was normal in size.  ------------------------------------------------------------ Mitral valve: Moderately calcified annulus. The posterior leaflet is calcified with restricted excursion. Doppler: There was no evidence for stenosis. Moderate regurgitation. Valve area by pressure  half-time: 5.25cm^2. Indexed valve area by pressure half-time: 2.64cm^2/m^2. Mean gradient: 3mm Hg (D). Peak gradient: 8mm Hg (D).  ------------------------------------------------------------ Left atrium: The atrium was moderately dilated.  ------------------------------------------------------------ Right ventricle: The cavity size was normal. Systolic function was normal.  ------------------------------------------------------------ Pulmonic valve: Structurally normal valve. Cusp separation was normal. Doppler: Transvalvular velocity was within the normal range. Trivial regurgitation.  ------------------------------------------------------------ Tricuspid valve: Doppler: Mild-moderate regurgitation.  ------------------------------------------------------------ Pulmonary artery: PA systolic pressure 52-57 mmHg.  ------------------------------------------------------------ Right atrium: The atrium was moderately dilated.  ------------------------------------------------------------ Pericardium: A trivial pericardial effusion was identified.  ------------------------------------------------------------ Systemic veins: IVC measured 2.2 cm with < 50% respirophasic variation, suggesting RA pressure 11-15 mmHg.  ------------------------------------------------------------  2D measurements Normal Doppler measurements Norma Left ventricle l LVID ED, 59 mm 43-52 Left ventricle chord, Ea, lat 7.12 cm/s ----- PLAX ann, tiss LVID ES, 53 mm 23-38 DP chord, E/Ea, lat 19.34 ----- PLAX ann, tiss FS, chord, 10 % >29 DP PLAX Ea, med 4.19 cm/s ----- LVPW, ED 14 mm  ------ ann, tiss IVS/LVPW 1 <1.3 DP ratio, ED E/Ea, med 32.87 ----- Ventricular septum ann, tiss IVS, ED 14 mm ------ DP LVOT Mitral valve Diam, S 27 mm ------ Peak E 137.71 cm/s ----- Area 5.73 cm^2 ------ vel Aorta Mean vel, 69.4 cm/s ----- Root diam, 31 mm ------ D ED Decelerat 953.92 cm/s^2 ----- Left atrium ion slope AP dim 55 mm ------ Decelerat 144 ms 150-2 AP dim 2.76 cm/m^2 <2.2 ion time 30 index Pressure 42 ms ----- half-time Mean 3 mm Hg ----- gradient, D Peak 8 mm Hg ----- gradient, D Area 5.25 cm^2 ----- (PHT) Area 2.64 cm^2/m ----- index ^2 (PHT) Annulus 35.6 cm ----- VTI Max 493 cm/s ----- regurg vel Regurg 177 cm ----- VTI Tricuspid valve Regurg 322 cm/s ----- peak vel Peak 41 mm Hg ----- RV-RA gradient, S Pulmonic valve Regurg 111 cm/s ----- vel, ED  ------------------------------------------------------------ Prepared and Electronically Authenticated by  Marca Ancona [id number]  MYOCARDIAL IMAGING WITH SPECT (REST AND PHARMACOLOGIC-STRESS)  GATED LEFT VENTRICULAR WALL MOTION STUDY  LEFT VENTRICULAR EJECTION FRACTION  Technique: Standard myocardial SPECT imaging was performed after  resting intravenous injection of 10 mCi Tc-38m sestamibi.  Subsequently, intravenous infusion of regadenoson was performed  under the supervision of the Cardiology staff. At peak effect of  the drug, 30 mCi Tc-55m sestamibi was injected intravenously and  standard myocardial SPECT imaging was performed. Quantitative  gated imaging was also performed to evaluate left ventricular wall  motion, and estimate left ventricular ejection fraction.  Comparison: None.  Findings: ECG showed LV hypertrophy with repolarization  abnormalities, no changes with Lexiscan stress. EF 32% on gated  images with inferior and inferolateral hypokinesis. The left  ventricle appeared dilated. There was a medium-sized, moderate  basal  inferior/inferoseptal/inferolateral and mid inferolateral  perfusion defect that was similar at rest and with Lexiscan stress.  IMPRESSION:  1. LV systolic dysfunction with EF 32% and inferior/inferolateral  hypokinesis.  2. Fixed medium-sized, moderate basal  inferior/inferolateral/inferoseptal and mid inferolateral perfusion  defect suggestive of prior myocardial infarction without  significant ischemia.  3. Intermediate risk study given depressed EF.  Original Report Authenticated By: Marca Ancona   PHYSICAL EXAM General: Well developed, well nourished, in no acute distress. Head: Normocephalic, atraumatic, sclera non-icteric, no xanthomas, nares are without discharge. Neck: Negative for carotid bruits. JVD not elevated. Lungs: Clear bilaterally to auscultation without wheezes, rales, or rhonchi. Breathing is unlabored. Heart: RRR S1 S2 without murmurs, rubs, or gallops.  Abdomen: Soft, non-tender,  non-distended with normoactive bowel sounds. No hepatomegaly. No rebound/guarding. No obvious abdominal masses. Msk:  Strength and tone appears normal for age. Extremities: No clubbing, cyanosis or edema.  Distal pedal pulses are 2+ and equal bilaterally. Neuro: Alert and oriented X 3. Moves all extremities spontaneously. Psych:  Responds to questions appropriately with a normal affect.  ASSESSMENT AND PLAN: 1. Acute on chronic systolic CHF- diuresed well. Appears at dry weight. Edema resolved. Lungs clear. On po lasix. Continue carvedilol, hydralazine, nitrates. Hold ARB until creatinine stablilzed. Patient considered previously for biV/ICD but EP wanted to reassess LV function 3 months post restoration of NSR to see if it would improve. This would be the first of April.  2. 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  3. Hypokalemia-replete  4. NSTEMI. Probably demand due to recent CHF exacerbation. Myoview shows no ischemia. EF 32%  5. CAD s/p  stenting of LAD and intermediate vessels in 3/12 with DES. LAD was restenosis of prior BMS in 2000. Severe residual disease in diagonal and PDA that were too small and diffusely diseased for PCI.  6. Atrial fibrillation- maintaining NSR on amiodarone. S/p DCCV  01/06/12 and 02/13/12.  7. HTN severe long standing HTN. Will resume ARB when creatinine stable.   8. History of CVA  9. Hyperlipidemia- on lipitor  10. Iron deficiency anemia.  11. Chronic anticoagulation- on IV heparin. Coumadin resumed. INR 1.24 today. Per pharmacy.  12. CKD stage 3.  Plan: Overall patient is ready for discharge. The issue is his anticoagulation. Still subtherapeutic on coumadin. With recent DCCV on 02/13/12 and prior CVA he needs bridging. If he could do Lovenox at home he could be DC. We will look into this today.  Active Problems:  HYPERTENSION  CAD (coronary artery disease)  HTN (hypertension)  CKD (chronic kidney disease) stage 3, GFR 30-59 ml/min  S/P coronary artery stent placement  Heart failure, acute on chronic, systolic and diastolic  Atrial fibrillation  SOB (shortness of breath)  ARDS (adult respiratory distress syndrome)  Respiratory failure  Acute myocardial infarction, subendocardial infarction, initial episode of care    Signed, Cleveland Yarbro [place] MD,FACC 03/03/2012 8:43 AM

## 2012-03-03 NOTE — Telephone Encounter (Signed)
Spoke with pt. According to hospital records pt  needs to make arrangements for lovenox bridge to be discharged today. I will forward to CVRR for folllow-up.

## 2012-03-03 NOTE — Discharge Summary (Signed)
Physician Discharge Summary     Patient ID: Erik Adams MRN: [id number] DOB/AGE: 19-May-1945 67 y.o.  Admit date: 02/27/2012 Discharge date: 03/03/2012  Admission Diagnoses:  Discharge Diagnoses:  Active Problems:  HYPERTENSION  CAD (coronary artery disease)  HTN (hypertension)  CKD (chronic kidney disease) stage 3, GFR 30-59 ml/min  S/P coronary artery stent placement  Heart failure, acute on chronic, systolic and diastolic  Atrial fibrillation  SOB (shortness of breath)  ARDS (adult respiratory distress syndrome)  Respiratory failure  Acute myocardial infarction, subendocardial infarction, initial episode of care   Significant Hospital tests/ studies/ interventions and procedures   SIGNIFICANT EVENTS:  1/16 positive troponin, heparin started  1/16 TTE: EF 25%  1/18 Heparin held due to hematuria. Foley D/C'd  1/20 NM perfusion>>> LINES / TUBES:  1-16 et>> 1/18  1-16 rt cvl>>> 1/19  CULTURES:  1-16 bc x 2>>  1-16 uc>> neg 1-16 sputum>>  1-16 flu panel>> neg   ANTIBIOTICS:  1-16 vanc>> 1/19  1-16 zoysn>> 1/16  1/16 ceftriaxone >> 1/19  1-16 tamiflu>> 1/19  1/16 levofloxacin>> 1/19  PATIENT SUMMARY: 67 h/o cad/chf ef 20% presents Hypoxia/dyspena/fever, RML > L sided infiltrates, VDRF. Positive troponin, started on heparin, ASA, b-blockade 1/16.    Hospital Course:  Acute resp failure, suspect mostly due to pulmonary edema. Little evidence to support dx of PNA  Admitted 1/16, non smoker , presents with 24 hours of increased sob, fever 101, profoundly hypoxic,and with dense infective process on rt lung via c x r. Requiring 100% bilevel. Recent exposure to flu. He has known EF or 20 % and followed by P. [place]. PCCM asked to evaluate and admit. Failed attempt at NIPPV. Required intubation. We treated him empirically for potential flu and CAP. Culture negative, stopped abx. Therapy focused of diuresis. We were able to successfully wean and extubate him and he is now  on room air.   Htn  Afib >> NSR  NSTEMI  CHF  Severe CM (EF 25%)  Acute on chronic systolic CHF- diuresed well. Appears at dry weight. Edema resolved. Lungs clear. On po lasix. Continue carvedilol, hydralazine, nitrates. Hold ARB until creatinine stablilzed. Patient considered previously for biV/ICD but EP wanted to reassess LV function 3 months post restoration of NSR to see if it would improve. This would be the first of April. Overall patient is ready for discharge. The issue is his anticoagulation. Still subtherapeutic on coumadin. With recent DCCV on 02/13/12 and prior CVA he needs bridging.   plan: Home on above rx   Hematuria, resolved  CRF Recent Labs  [facility] 03/03/12 0530 03/02/12 0327 03/01/12 0425   CREATININE 1.66* 1.76* 1.61*   PLAN:  -monitor renal panel intermittently  -Agree with resumption of anticoagulation   Coumadin use for A Fib  Lab Results  Component Value Date   INR 1.23 03/03/2012   INR 1.24 03/02/2012   INR 1.43 03/01/2012   PLAN:  -Cont anticoagulation, f/u coumadin clinic    Discharge Exam: BP 170/107  Pulse 80  Temp 98 F (36.7 C) (Oral)  Resp 18  Ht 5\' 10"  (1.778 m)  Wt 72.122 kg (159 lb)  BMI 22.81 kg/m2  SpO2 97%  PHYSICAL EXAMINATION:  General: NAD  Neuro: Intact  HEENT: WNL  Cardiovascular: RRR s M  Lungs: Clear anteriorly  Abdomen: NABS, soft  Ext: nno edema   Labs at discharge Lab Results  Component Value Date   CREATININE 1.66* 03/03/2012   BUN 26* 03/03/2012   NA  136 03/03/2012   K 3.1* 03/03/2012   CL 98 03/03/2012   CO2 27 03/03/2012   Lab Results  Component Value Date   WBC 7.9 03/02/2012   HGB 9.7* 03/02/2012   HCT 31.3* 03/02/2012   MCV 79.0 03/02/2012   PLT 216 03/02/2012   Lab Results  Component Value Date   ALT 33 02/27/2012   AST 36 02/27/2012   ALKPHOS 93 02/27/2012   BILITOT 1.0 02/27/2012   Lab Results  Component Value Date   INR 1.23 03/03/2012   INR 1.24 03/02/2012   INR 1.43 03/01/2012    Current  radiology studies Nm Myocar Multi W/spect W/wall Motion / Ef  03/02/2012  *RADIOLOGY REPORT*  Clinical Data:  chest pain  MYOCARDIAL IMAGING WITH SPECT (REST AND PHARMACOLOGIC-STRESS) GATED LEFT VENTRICULAR WALL MOTION STUDY LEFT VENTRICULAR EJECTION FRACTION  Technique:  Standard myocardial SPECT imaging was performed after resting intravenous injection of 10 mCi Tc-85m sestamibi. Subsequently, intravenous infusion of regadenoson was performed under the supervision of the Cardiology staff.  At peak effect of the drug, 30 mCi Tc-68m sestamibi was injected intravenously and standard myocardial SPECT  imaging was performed.  Quantitative gated imaging was also performed to evaluate left ventricular wall motion, and estimate left ventricular ejection fraction.  Comparison:  None.  Findings: ECG showed LV hypertrophy with repolarization abnormalities, no changes with Lexiscan stress.  EF 32% on gated images with inferior and inferolateral hypokinesis.  The left ventricle appeared dilated.  There was a medium-sized, moderate basal inferior/inferoseptal/inferolateral and mid inferolateral perfusion defect that was similar at rest and with Lexiscan stress.  IMPRESSION: 1. LV systolic dysfunction with EF 32% and inferior/inferolateral hypokinesis.  2. Fixed medium-sized, moderate basal inferior/inferolateral/inferoseptal and mid inferolateral perfusion defect suggestive of prior myocardial infarction without significant ischemia.  3. Intermediate risk study given depressed EF.   Original Report Authenticated By: Marca Ancona     Disposition:  06-Home-Health Care Svc      Discharge Orders    Future Appointments: Provider: Department: Dept Phone: Center:   03/06/2012 3:30 PM [facility] [phone] None   03/18/2012 4:00 PM Peter M [place], MD [facility] Main Office [place]) [phone] [facility]     Future Orders Please Complete By Expires   Diet - low  sodium heart healthy      Increase activity slowly      (HEART FAILURE PATIENTS) Call MD:  Anytime you have any of the following symptoms: 1) 3 pound weight gain in 24 hours or 5 pounds in 1 week 2) shortness of breath, with or without a dry hacking cough 3) swelling in the hands, feet or stomach 4) if you have to sleep on extra pillows at night in order to breathe.          Medication List     As of 03/03/2012  1:04 PM    STOP taking these medications         amLODipine 10 MG tablet   Commonly known as: NORVASC      oseltamivir 75 MG capsule   Commonly known as: TAMIFLU      TAKE these medications         amiodarone 400 MG tablet   Commonly known as: PACERONE   Take 1 tablet (400 mg total) by mouth daily.      aspirin 81 MG chewable tablet   Chew 1 tablet (81 mg total) by mouth daily.      atorvastatin 20 MG  tablet   Commonly known as: LIPITOR   Place 1 tablet (20 mg total) into feeding tube daily at 6 PM.      carvedilol 25 MG tablet   Commonly known as: COREG   Take 1 tablet (25 mg total) by mouth 2 (two) times daily with a meal.      enoxaparin 80 MG/0.8ML injection   Commonly known as: LOVENOX   Inject 0.7 mLs (70 mg total) into the skin every 12 (twelve) hours.      furosemide 40 MG tablet   Commonly known as: LASIX   Take 3 tablets (120 mg total) by mouth every morning.      hydrALAZINE 50 MG tablet   Commonly known as: APRESOLINE   Take 1.5 tablets (75 mg total) by mouth 3 (three) times daily.      isosorbide dinitrate 20 MG tablet   Commonly known as: ISORDIL   Take 1 tablet (20 mg total) by mouth 3 (three) times daily.      nitroGLYCERIN 0.4 MG SL tablet   Commonly known as: NITROSTAT   Place 0.4 mg under the tongue every 5 (five) minutes x 3 doses as needed. For chest pain      potassium chloride SA 20 MEQ tablet   Commonly known as: K-DUR,KLOR-CON   Take 2 tablets (40 mEq total) by mouth 2 (two) times daily.      warfarin 5 MG tablet   Commonly  known as: COUMADIN   Take 2.5-5 mg by mouth daily. Take 1/2 tablet (2.5 mg) on Mondays and take 1 tablet (5 mg) on all other days         Follow-up Information    Follow up with [facility]. On 03/06/2012. (at 330pm )    Contact information:   [address] [place] [address] [phone]         Discharged Condition: {good Physician Statement:   The Patient was personally examined, the discharge assessment and plan has been personally reviewed and I agree with ACNP Babcock's assessment and plan. > 30 minutes of time have been dedicated to discharge assessment, planning and discharge instructions.   SignedAnders Simmonds 03/03/2012, 1:04 PM   Agree with above  Billy Fischer, MD

## 2012-03-04 LAB — CULTURE, BLOOD (ROUTINE X 2): Culture: NO GROWTH

## 2012-03-06 ENCOUNTER — Ambulatory Visit (INDEPENDENT_AMBULATORY_CARE_PROVIDER_SITE_OTHER): Payer: Medicare Other | Admitting: *Deleted

## 2012-03-06 DIAGNOSIS — I4891 Unspecified atrial fibrillation: Secondary | ICD-10-CM

## 2012-03-06 DIAGNOSIS — I5189 Other ill-defined heart diseases: Secondary | ICD-10-CM

## 2012-03-06 DIAGNOSIS — I513 Intracardiac thrombosis, not elsewhere classified: Secondary | ICD-10-CM

## 2012-03-06 DIAGNOSIS — Z7901 Long term (current) use of anticoagulants: Secondary | ICD-10-CM

## 2012-03-13 ENCOUNTER — Telehealth: Payer: Self-pay

## 2012-03-13 NOTE — Telephone Encounter (Signed)
Got a refill request for Erik Adams for his Isosorbide 20 MG Three (3) times a day. I called [facility] and verified that the refill sent in 03/03/2012 with Ninety (90) tablets with 5 refills was at the pharmacy. The refill is ready for the patient. Called Erik Adams and left a message to call back so I can verify he knows his medication is at [facility].

## 2012-03-18 ENCOUNTER — Ambulatory Visit (INDEPENDENT_AMBULATORY_CARE_PROVIDER_SITE_OTHER): Payer: Medicare Other | Admitting: Cardiology

## 2012-03-18 ENCOUNTER — Ambulatory Visit (INDEPENDENT_AMBULATORY_CARE_PROVIDER_SITE_OTHER): Payer: Medicare Other | Admitting: Pharmacist

## 2012-03-18 ENCOUNTER — Encounter: Payer: Self-pay | Admitting: Cardiology

## 2012-03-18 VITALS — BP 180/90 | HR 66 | Ht 70.0 in | Wt 170.8 lb

## 2012-03-18 DIAGNOSIS — I5189 Other ill-defined heart diseases: Secondary | ICD-10-CM

## 2012-03-18 DIAGNOSIS — I2510 Atherosclerotic heart disease of native coronary artery without angina pectoris: Secondary | ICD-10-CM

## 2012-03-18 DIAGNOSIS — I5042 Chronic combined systolic (congestive) and diastolic (congestive) heart failure: Secondary | ICD-10-CM

## 2012-03-18 DIAGNOSIS — I10 Essential (primary) hypertension: Secondary | ICD-10-CM

## 2012-03-18 DIAGNOSIS — R0602 Shortness of breath: Secondary | ICD-10-CM

## 2012-03-18 DIAGNOSIS — Z7901 Long term (current) use of anticoagulants: Secondary | ICD-10-CM

## 2012-03-18 DIAGNOSIS — I4891 Unspecified atrial fibrillation: Secondary | ICD-10-CM

## 2012-03-18 DIAGNOSIS — N183 Chronic kidney disease, stage 3 unspecified: Secondary | ICD-10-CM

## 2012-03-18 DIAGNOSIS — I513 Intracardiac thrombosis, not elsewhere classified: Secondary | ICD-10-CM

## 2012-03-18 LAB — POCT INR: INR: 1.9

## 2012-03-18 NOTE — Progress Notes (Signed)
Erik Adams Date of Birth: 1945/08/04 Medical Record #[id number]  History of Present Illness: [name] is seen back today for a followup visit. He was hospitalized in January with acute congestive heart failure requiring ventilator support. He was in sinus rhythm. Echocardiogram showed severe LV dysfunction with ejection fraction of 25% and restrictive physiology. A Myoview study demonstrated a fixed defect in the inferior wall without significant ischemia. Ejection fraction was 32%. His ARB was held during his hospital stay because of renal insufficiency. He was diuresed. He responded well to medical therapy. He reports that since discharge he has had no recurrent edema. His breathing is doing very well. His weight has been stable. He denies orthopnea. He's had no recurrent chest pain or palpitations. He states he has not taken his blood pressure medications yet today.   Current Outpatient Prescriptions on File Prior to Visit  Medication Sig Dispense Refill  . amiodarone (PACERONE) 400 MG tablet Take 1 tablet (400 mg total) by mouth daily.  30 tablet  6  . aspirin 81 MG chewable tablet Chew 1 tablet (81 mg total) by mouth daily.      Marland Kitchen atorvastatin (LIPITOR) 20 MG tablet Place 1 tablet (20 mg total) into feeding tube daily at 6 PM.  30 tablet  0  . carvedilol (COREG) 25 MG tablet Take 1 tablet (25 mg total) by mouth 2 (two) times daily with a meal.  60 tablet  6  . furosemide (LASIX) 40 MG tablet Take 3 tablets (120 mg total) by mouth every morning.  90 tablet  4  . hydrALAZINE (APRESOLINE) 50 MG tablet Take 1.5 tablets (75 mg total) by mouth 3 (three) times daily.  180 tablet  6  . isosorbide dinitrate (ISORDIL) 20 MG tablet Take 1 tablet (20 mg total) by mouth 3 (three) times daily.  90 tablet  5  . nitroGLYCERIN (NITROSTAT) 0.4 MG SL tablet Place 0.4 mg under the tongue every 5 (five) minutes x 3 doses as needed. For chest pain      . potassium chloride SA (K-DUR,KLOR-CON) 20 MEQ tablet  Take 2 tablets (40 mEq total) by mouth 2 (two) times daily.  180 tablet  6  . warfarin (COUMADIN) 5 MG tablet Take 2.5-5 mg by mouth daily. Take 1/2 tablet (2.5 mg) on Mondays and take 1 tablet (5 mg) on all other days        Allergies  Allergen Reactions  . Ace Inhibitors Other (See Comments)    Unknown reaction  . Clonidine Derivatives Other (See Comments)    Unknown reaction    Past Medical History  Diagnosis Date  . CAD (coronary artery disease)     a. s/p BMS to mLAD 2000;  b. LHC 3/12:  sig ISR mLAD, RI 80-90%, Dx1 90% (too small for PCI);  PCI:  ION DES to mLAD, ION DES to RI  . HTN (hypertension)   . Hypercholesteremia   . CVA (cerebral infarction)   . CRI (chronic renal insufficiency)     Baseline creatinine of 1.4 to 1.7  . 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 Atrial fibrillation 11/13/2011    coumadin and amiodarone; s/p DCCV November 2013; repeat DCCV Feb 13, 2012  . Left atrial thrombus     TEE 11/2011  . Iron deficiency anemia     Past Surgical History  Procedure Date  . Coronary angioplasty with stent placement 2000    BMS to the LAD  . Mastoidectomy   .  Coronary angioplasty with stent placement March 2012    DES to LAD and Ramus intermdius  . Tee without cardioversion 12/06/2011    Procedure: TRANSESOPHAGEAL ECHOCARDIOGRAM (TEE);  Surgeon: Pricilla Riffle, MD;  Location: [facility] ENDOSCOPY;  Service: Cardiovascular;  Laterality: N/A;  . Cardioversion 01/06/2012    Procedure: CARDIOVERSION;  Surgeon: Wendall Stade, MD;  Location: [facility] ENDOSCOPY;  Service: Cardiovascular;  Laterality: N/A;  call anesthesia  maybe able to do cardio. early at 12:00  . Cardioversion 02/13/2012    Procedure: CARDIOVERSION;  Surgeon: Cassell Clement, MD;  Location: [facility] ENDOSCOPY;  Service: Cardiovascular;  Laterality: N/A;  . Tonsillectomy   . Cholecystectomy 1999  . Left inguinal hernia repair 02/2007  . Hernia repair     History  Smoking status  . Former Smoker  .  Types: Cigarettes  . Quit date: 02/11/1974  Smokeless tobacco  . Never Used    History  Alcohol Use No    Family History  Problem Relation Age of Onset  . Heart disease Father     Review of Systems: The review of systems is per the HPI.  All other systems were reviewed and are negative.  Physical Exam: BP 180/90  Pulse 66  Ht 5\' 10"  (1.778 m)  Wt 170 lb 12.8 oz (77.474 kg)  BMI 24.51 kg/m2  SpO2 96% Patient is very pleasant and in no acute distress.  Skin is warm and dry. Color is normal.  HEENT is unremarkable. Normocephalic/atraumatic. PERRL. Sclera are nonicteric. Neck is supple. No masses. No JVD. Lungs are clear. Cardiac exam shows a regular rate and rhythm today. Abdomen is soft. Extremities are without edema bilaterally. Gait and ROM are intact. No gross neurologic deficits noted.  LABORATORY DATA:   Lab Results  Component Value Date   WBC 7.9 03/02/2012   HGB 9.7* 03/02/2012   HCT 31.3* 03/02/2012   PLT 216 03/02/2012   GLUCOSE 99 03/03/2012   CHOL 98 11/13/2011   TRIG 43.0 11/13/2011   HDL 34.50* 11/13/2011   LDLCALC 55 11/13/2011   ALT 33 02/27/2012   AST 36 02/27/2012   NA 136 03/03/2012   K 3.1* 03/03/2012   CL 98 03/03/2012   CREATININE 1.66* 03/03/2012   BUN 26* 03/03/2012   CO2 27 03/03/2012   TSH 6.34* 11/13/2011   INR 1.9 03/18/2012     Assessment / Plan: 1. PAF - remains in sinus rhythm. He is status post DC cardioversion on 01/06/2012 in 02/13/2012. Continue anticoagulation on Coumadin.  2. Chronic systolic heart failure - recent hospitalization with acute decompensation. Clinically improved. Continue Lasix 120 mg daily. Monitor weight and edema closely. We will check a basic metabolic panel and BNP level today. If renal function is stable we will resume ARB. Salt restriction is imperative. If the patient is able to maintain sinus rhythm we will reassess LV function  the first week of April. If EF remains less than 35% would proceed with ICD/CRT therapy.  3.  HTN - blood pressure is elevated. We will assess whether to resume his ARB therapy.  4. Coronary disease status post DES to the LAD and ramus intermediate branch is in March of 2012. He has significant residual disease in the diagonal and PDA branches that are diffusely diseased and small. He has no anginal symptoms and recent Myoview showed no ischemia.  5. Chronic kidney disease stage III.  6. Hyperlipidemia, on Lipitor.  7. History of CVA. Patient is on anticoagulation with Coumadin. INR is 1.9 today.  8. Iron deficiency anemia.

## 2012-03-18 NOTE — Patient Instructions (Signed)
Continue your current mediation  We will check blood work today- we will call with the results.  I will see you in one month.

## 2012-03-19 ENCOUNTER — Other Ambulatory Visit: Payer: Self-pay

## 2012-03-19 LAB — BASIC METABOLIC PANEL
BUN: 29 mg/dL — ABNORMAL HIGH (ref 6–23)
Calcium: 8.7 mg/dL (ref 8.4–10.5)
Creatinine, Ser: 1.7 mg/dL — ABNORMAL HIGH (ref 0.4–1.5)

## 2012-03-19 LAB — BRAIN NATRIURETIC PEPTIDE: Pro B Natriuretic peptide (BNP): 1711 pg/mL — ABNORMAL HIGH (ref 0.0–100.0)

## 2012-03-19 MED ORDER — OLMESARTAN MEDOXOMIL 20 MG PO TABS: 20.0000 mg | ORAL_TABLET | Freq: Every day | ORAL | Status: DC

## 2012-03-19 MED ORDER — ISOSORBIDE DINITRATE 20 MG PO TABS: 20.0000 mg | ORAL_TABLET | Freq: Three times a day (TID) | ORAL | Status: DC

## 2012-03-19 MED ORDER — AMIODARONE HCL 400 MG PO TABS: 400.0000 mg | ORAL_TABLET | Freq: Every day | ORAL | Status: DC

## 2012-03-19 NOTE — Telephone Encounter (Signed)
Spoke to patient was told refill for isosorbide sent to [facility].

## 2012-03-31 ENCOUNTER — Telehealth: Payer: Self-pay | Admitting: Cardiology

## 2012-03-31 NOTE — Telephone Encounter (Signed)
Per refill line   Kim at [facility] called in regards to prescription for Isosorbide Dinitrate and Benicar both of these meds are $140.00 or more. He is not taking them because he can not afford them, can we switch to something that would be cheaper for the patient. She said an Imdur product is cheaper on his insurance and a generic in the same class as Benicar would be cheaper.

## 2012-04-03 MED ORDER — ISOSORBIDE MONONITRATE ER 60 MG PO TB24: 60.0000 mg | ORAL_TABLET | Freq: Every day | ORAL | Status: DC

## 2012-04-03 MED ORDER — LOSARTAN POTASSIUM 50 MG PO TABS: 50.0000 mg | ORAL_TABLET | Freq: Every day | ORAL | Status: DC

## 2012-04-03 NOTE — Telephone Encounter (Signed)
Spoke to pharmacist at [facility] was told Dr.Jordan advised ok to stop isosorbide dinitrate and start imdur 60 mg daily and stop benicar and start lorsartan 50 mg daily.

## 2012-04-08 ENCOUNTER — Ambulatory Visit (INDEPENDENT_AMBULATORY_CARE_PROVIDER_SITE_OTHER): Payer: Medicare Other | Admitting: *Deleted

## 2012-04-08 DIAGNOSIS — I5189 Other ill-defined heart diseases: Secondary | ICD-10-CM

## 2012-04-08 DIAGNOSIS — I513 Intracardiac thrombosis, not elsewhere classified: Secondary | ICD-10-CM

## 2012-04-08 DIAGNOSIS — Z7901 Long term (current) use of anticoagulants: Secondary | ICD-10-CM

## 2012-04-08 DIAGNOSIS — I4891 Unspecified atrial fibrillation: Secondary | ICD-10-CM

## 2012-05-07 ENCOUNTER — Ambulatory Visit (INDEPENDENT_AMBULATORY_CARE_PROVIDER_SITE_OTHER): Payer: Medicare Other | Admitting: General Surgery

## 2012-05-07 ENCOUNTER — Encounter (INDEPENDENT_AMBULATORY_CARE_PROVIDER_SITE_OTHER): Payer: Self-pay | Admitting: General Surgery

## 2012-05-07 VITALS — BP 158/92 | HR 64 | Temp 97.2°F | Resp 16 | Ht 70.0 in | Wt 165.0 lb

## 2012-05-07 DIAGNOSIS — R1032 Left lower quadrant pain: Secondary | ICD-10-CM

## 2012-05-07 NOTE — Progress Notes (Signed)
Patient ID: Erik Adams, male   DOB: Nov 17, 1945, 67 y.o.   MRN: [id number]  Chief Complaint  Patient presents with  . New Evaluation    eval [facility]    HPI Erik Adams is a 67 y.o. male.   HPI  He is self-referred for left groin pain and concern for recurrent left inguinal hernia. I saw him for something similar December of 2011. He underwent a left inguinal hernia repair by Dr. Malvin Johns in 2007. He is still getting intermittent pains and is concerned he may have a recurrent hernia. He was recently in the hospital due to respiratory failure, pneumonia, and congestive heart failure.  Past Medical History  Diagnosis Date  . CAD (coronary artery disease)     a. s/p BMS to mLAD 2000;  b. LHC 3/12:  sig ISR mLAD, RI 80-90%, Dx1 90% (too small for PCI);  PCI:  ION DES to mLAD, ION DES to RI  . HTN (hypertension)   . Hypercholesteremia   . CVA (cerebral infarction)   . CRI (chronic renal insufficiency)     Baseline creatinine of 1.4 to 1.7  . 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 Atrial fibrillation 11/13/2011    coumadin and amiodarone; s/p DCCV November 2013; repeat DCCV Feb 13, 2012  . Left atrial thrombus     TEE 11/2011  . Iron deficiency anemia     Past Surgical History  Procedure Laterality Date  . Coronary angioplasty with stent placement  2000    BMS to the LAD  . Mastoidectomy    . Coronary angioplasty with stent placement  March 2012    DES to LAD and Ramus intermdius  . Tee without cardioversion  12/06/2011    Procedure: TRANSESOPHAGEAL ECHOCARDIOGRAM (TEE);  Surgeon: Pricilla Riffle, MD;  Location: [facility] ENDOSCOPY;  Service: Cardiovascular;  Laterality: N/A;  . Cardioversion  01/06/2012    Procedure: CARDIOVERSION;  Surgeon: Wendall Stade, MD;  Location: [facility] ENDOSCOPY;  Service: Cardiovascular;  Laterality: N/A;  call anesthesia  maybe able to do cardio. early at 12:00  . Cardioversion  02/13/2012    Procedure: CARDIOVERSION;  Surgeon: Cassell Clement, MD;   Location: [facility] ENDOSCOPY;  Service: Cardiovascular;  Laterality: N/A;  . Tonsillectomy    . Cholecystectomy  1999  . Left inguinal hernia repair  02/2007  . Hernia repair      Family History  Problem Relation Age of Onset  . Heart disease Father     Social History History  Substance Use Topics  . Smoking status: Former Smoker    Types: Cigarettes    Quit date: 02/11/1974  . Smokeless tobacco: Not on file  . Alcohol Use: No    Allergies  Allergen Reactions  . Ace Inhibitors Other (See Comments)    Unknown reaction  . Clonidine Derivatives Other (See Comments)    Unknown reaction    Current Outpatient Prescriptions  Medication Sig Dispense Refill  . amiodarone (PACERONE) 400 MG tablet Take 1 tablet (400 mg total) by mouth daily.  30 tablet  3  . aspirin 81 MG chewable tablet Chew 1 tablet (81 mg total) by mouth daily.      Marland Kitchen atorvastatin (LIPITOR) 20 MG tablet Place 1 tablet (20 mg total) into feeding tube daily at 6 PM.  30 tablet  0  . carvedilol (COREG) 25 MG tablet Take 1 tablet (25 mg total) by mouth 2 (two) times daily with a meal.  60 tablet  6  .  furosemide (LASIX) 40 MG tablet Take 3 tablets (120 mg total) by mouth every morning.  90 tablet  4  . hydrALAZINE (APRESOLINE) 50 MG tablet Take 1.5 tablets (75 mg total) by mouth 3 (three) times daily.  180 tablet  6  . isosorbide mononitrate (IMDUR) 60 MG 24 hr tablet Take 1 tablet (60 mg total) by mouth daily.  30 tablet  6  . losartan (COZAAR) 50 MG tablet Take 1 tablet (50 mg total) by mouth daily.  30 tablet  6  . nitroGLYCERIN (NITROSTAT) 0.4 MG SL tablet Place 0.4 mg under the tongue every 5 (five) minutes x 3 doses as needed. For chest pain      . potassium chloride SA (K-DUR,KLOR-CON) 20 MEQ tablet Take 2 tablets (40 mEq total) by mouth 2 (two) times daily.  180 tablet  6  . warfarin (COUMADIN) 5 MG tablet Take 2.5-5 mg by mouth daily. Take 1/2 tablet (2.5 mg) on Mondays and take 1 tablet (5 mg) on all other days        No current facility-administered medications for this visit.    Review of Systems Review of Systems  Gastrointestinal: Negative for abdominal pain.    Blood pressure 158/92, pulse 64, temperature 97.2 F (36.2 C), temperature source Temporal, resp. rate 16, height 5\' 10"  (1.778 m), weight 165 lb (74.844 kg).  Physical Exam Physical Exam  Constitutional: He appears well-developed and well-nourished. No distress.  Genitourinary:  Very small right inguinal bulge with a cough. Left inguinal scar. No bulge present. No testicular masses    Data Reviewed Old Chart  Assessment    Chronic left groin pain following left inguinal hernia repair. No evidence of recurrence. He does have a very small, asymptomatic right inguinal hernia. I did not feel this needs to be repaired.     Plan    Return visit as needed. I did discuss with him the fact that this intermittent pain would probably be something he needs to live with.        Keiarah Orlowski J 05/07/2012, 4:35 PM

## 2012-05-07 NOTE — Patient Instructions (Signed)
You do not have a recurrent left inguinal hernia.  You have a very small right inguinal hernia and I do not feel it needs to be repaired at this time.

## 2012-06-11 ENCOUNTER — Other Ambulatory Visit: Payer: Self-pay

## 2012-06-11 MED ORDER — CARVEDILOL 25 MG PO TABS: 25.0000 mg | ORAL_TABLET | Freq: Two times a day (BID) | ORAL | Status: DC

## 2012-06-11 NOTE — Telephone Encounter (Signed)
Patient Instructions    Continue your current mediation   We will check blood work today- we will call with the results.   I will see you in one month.                Previous Visit      Provider Department Encounter #    03/13/2012 11:52 AM Peter [place], MD [facility] [id number]

## 2012-07-02 ENCOUNTER — Ambulatory Visit (INDEPENDENT_AMBULATORY_CARE_PROVIDER_SITE_OTHER): Payer: Medicare Other | Admitting: Pharmacist Clinician (PhC)/ Clinical Pharmacy Specialist

## 2012-07-02 ENCOUNTER — Encounter: Payer: Self-pay | Admitting: Pharmacist Clinician (PhC)/ Clinical Pharmacy Specialist

## 2012-07-02 VITALS — BP 164/92 | HR 60

## 2012-07-02 DIAGNOSIS — I10 Essential (primary) hypertension: Secondary | ICD-10-CM

## 2012-07-02 NOTE — Patient Instructions (Addendum)
When BiDil runs out, switch to hydralazine 50mg  -2 tablets THREE times daily AND isosorbide 20mg  - 2 tabs THREE times daily.    Come back in 4 weeks for INR check.

## 2012-07-02 NOTE — Progress Notes (Signed)
HPI:  Pt here for a blood pressure follow up appointment.  He was originally on BiDil but switched to hydralazine + isosorbide for cost issues.  He then stated that the meds caused him to feel "lousy" and stopped.  At next BP check he was still elevated at 172/100.  He has been reluctant to try other meds and stated he would re-try the BiDil (2 tabs tid), as he still had multiple sample bottles at home.  Today he states compliant with med, taking at 6am, noonish and 9-10pm.  Some episodes of dizziness, but is tolerating.  Pt had elevated SCr, 2.16 after starting irbesartan, was d/c at April BP check.  Cannot tolerate amlodipine = swelling.     Current Outpatient Prescriptions  Medication Sig Dispense Refill  . amiodarone (PACERONE) 400 MG tablet Take 1 tablet (400 mg total) by mouth daily.  30 tablet  3  . aspirin 81 MG chewable tablet Chew 1 tablet (81 mg total) by mouth daily.      Marland Kitchen atorvastatin (LIPITOR) 20 MG tablet Place 1 tablet (20 mg total) into feeding tube daily at 6 PM.  30 tablet  0  . carvedilol (COREG) 25 MG tablet Take 1 tablet (25 mg total) by mouth 2 (two) times daily with a meal.  60 tablet  6  . furosemide (LASIX) 40 MG tablet Take 3 tablets (120 mg total) by mouth every morning.  90 tablet  4  . hydrALAZINE (APRESOLINE) 50 MG tablet Take 1.5 tablets (75 mg total) by mouth 3 (three) times daily.  180 tablet  6  . isosorbide mononitrate (IMDUR) 60 MG 24 hr tablet Take 1 tablet (60 mg total) by mouth daily.  30 tablet  6  . losartan (COZAAR) 50 MG tablet Take 1 tablet (50 mg total) by mouth daily.  30 tablet  6  . nitroGLYCERIN (NITROSTAT) 0.4 MG SL tablet Place 0.4 mg under the tongue every 5 (five) minutes x 3 doses as needed. For chest pain      . potassium chloride SA (K-DUR,KLOR-CON) 20 MEQ tablet Take 2 tablets (40 mEq total) by mouth 2 (two) times daily.  180 tablet  6  . warfarin (COUMADIN) 5 MG tablet Take 2.5-5 mg by mouth daily. Take 1/2 tablet (2.5 mg) on Mondays and  take 1 tablet (5 mg) on all other days       No current facility-administered medications for this visit.    Allergies  Allergen Reactions  . Ace Inhibitors Other (See Comments)    Unknown reaction  . Clonidine Derivatives Other (See Comments)    Unknown reaction    Past Medical History  Diagnosis Date  . CAD (coronary artery disease)     a. s/p BMS to mLAD 2000;  b. LHC 3/12:  sig ISR mLAD, RI 80-90%, Dx1 90% (too small for PCI);  PCI:  ION DES to mLAD, ION DES to RI  . HTN (hypertension)   . Hypercholesteremia   . CVA (cerebral infarction)   . CRI (chronic renal insufficiency)     Baseline creatinine of 1.4 to 1.7  . 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 Atrial fibrillation 11/13/2011    coumadin and amiodarone; s/p DCCV November 2013; repeat DCCV Feb 13, 2012  . Left atrial thrombus     TEE 11/2011  . Iron deficiency anemia     BP 164/92  Pulse 60 today  Vitals - 1 value per visit 07/02/2012 05/07/2012 03/18/2012 03/03/2012  SYSTOLIC  164 158 180 170  DIASTOLIC 92 92 90 107  PULSE 60 64 66 80    ASSESSMENT AND PLAN: Pt reluctant to try new meds, blood pressure slightly better than last month appointment, but still Stage II hypertension.  Will increase hydralazine to 100mg  tid.  Pt has follow up appointment with Dr.  Tresa Endo in several weeks.  Will continue to monitor BP at INR appointments

## 2012-07-20 ENCOUNTER — Telehealth: Payer: Self-pay | Admitting: Cardiovascular Disease

## 2012-07-20 NOTE — Telephone Encounter (Signed)
Please call-thinks he might have the defibilator!

## 2012-07-20 NOTE — Telephone Encounter (Signed)
Returned call.  Pt stated he thinks he wants to have a defibrillator.  Stated Dr. Tresa Endo has talked to him about this before, but he has put it off.  Pt has an appt tomorrow at 4pm w/ Dr. Tresa Endo and advised to keep that appt so he can discuss the defibrillator.  Pt verbalized understanding and agreed w/ plan.

## 2012-07-21 ENCOUNTER — Encounter: Payer: Self-pay | Admitting: Cardiovascular Disease

## 2012-07-21 ENCOUNTER — Encounter: Payer: Self-pay | Admitting: *Deleted

## 2012-07-21 ENCOUNTER — Ambulatory Visit (INDEPENDENT_AMBULATORY_CARE_PROVIDER_SITE_OTHER): Payer: Medicare Other | Admitting: Cardiovascular Disease

## 2012-07-21 VITALS — BP 178/100 | HR 65 | Ht 70.0 in | Wt 166.2 lb

## 2012-07-21 DIAGNOSIS — Z955 Presence of coronary angioplasty implant and graft: Secondary | ICD-10-CM

## 2012-07-21 DIAGNOSIS — N183 Chronic kidney disease, stage 3 unspecified: Secondary | ICD-10-CM

## 2012-07-21 DIAGNOSIS — R0602 Shortness of breath: Secondary | ICD-10-CM

## 2012-07-21 DIAGNOSIS — I10 Essential (primary) hypertension: Secondary | ICD-10-CM

## 2012-07-21 DIAGNOSIS — Z9861 Coronary angioplasty status: Secondary | ICD-10-CM

## 2012-07-21 DIAGNOSIS — I5042 Chronic combined systolic (congestive) and diastolic (congestive) heart failure: Secondary | ICD-10-CM

## 2012-07-21 DIAGNOSIS — I2510 Atherosclerotic heart disease of native coronary artery without angina pectoris: Secondary | ICD-10-CM

## 2012-07-21 DIAGNOSIS — E7800 Pure hypercholesterolemia, unspecified: Secondary | ICD-10-CM

## 2012-07-21 NOTE — Patient Instructions (Addendum)
Your physician has recommended you make the following change in your medication:TAKE THE ISOSORBIDE 60 mg 1 tablet twice daily.  TAKE THE HYDRALAZINE 50 MG TAKE 1 AND 1/2 TABLET 3 TIMES A DAY.  Your physician recommends that you schedule a follow-up appointment in: With Baxter Hire on June 12th @ 1:40. Bring all medications with you including over the counter medications.

## 2012-07-23 ENCOUNTER — Ambulatory Visit (INDEPENDENT_AMBULATORY_CARE_PROVIDER_SITE_OTHER): Payer: Medicare Other | Admitting: Pharmacist Clinician (PhC)/ Clinical Pharmacy Specialist

## 2012-07-23 ENCOUNTER — Encounter: Payer: Self-pay | Admitting: Pharmacist Clinician (PhC)/ Clinical Pharmacy Specialist

## 2012-07-23 VITALS — BP 150/92 | HR 64

## 2012-07-23 DIAGNOSIS — I10 Essential (primary) hypertension: Secondary | ICD-10-CM

## 2012-07-23 DIAGNOSIS — I4891 Unspecified atrial fibrillation: Secondary | ICD-10-CM

## 2012-07-23 DIAGNOSIS — I5189 Other ill-defined heart diseases: Secondary | ICD-10-CM

## 2012-07-23 DIAGNOSIS — Z7901 Long term (current) use of anticoagulants: Secondary | ICD-10-CM

## 2012-07-23 DIAGNOSIS — I513 Intracardiac thrombosis, not elsewhere classified: Secondary | ICD-10-CM

## 2012-07-23 DIAGNOSIS — Z79899 Other long term (current) drug therapy: Secondary | ICD-10-CM

## 2012-07-23 MED ORDER — CHLORTHALIDONE 25 MG PO TABS: ORAL_TABLET | ORAL | Status: DC

## 2012-07-23 NOTE — Progress Notes (Signed)
HPI:  Erik Adams is a 67 y.o. male with a PMH below who presents today for a blood pressure check. He was in to see Dr. Tresa Endo on Tuesday and his blood pressure was elevated at 170/98.  Unfortunately he could tell us very little about his meds, couldn't name any of them and only brought 2 to the office.  The 2 he brought were isosorbide mono ER 60mg  and hydralazine 50mg .  He stated at that time he was taking the isosorbide 2 tabs tid, although he had no complaints of headache.  He originally did not like the Bidil because of HA, so I'm not sure if he was really getting 360mg  of isosorbide daily.  He was previously on losartan then irbesartan, however he had a jump in creatinine from 1.7 to 2.3 so this was discontinued.  He states severe swelling with amlodipine, so we have few options for BP control.  At his visit on Tuesday he was told to drop the isosorbide to 1 tab twice daily and continue hydralazine 75mg  tid.     Current Outpatient Prescriptions  Medication Sig Dispense Refill  . amiodarone (PACERONE) 400 MG tablet Take 1 tablet (400 mg total) by mouth daily.  30 tablet  3  . aspirin 81 MG chewable tablet Chew 1 tablet (81 mg total) by mouth daily.      Marland Kitchen atorvastatin (LIPITOR) 20 MG tablet Place 1 tablet (20 mg total) into feeding tube daily at 6 PM.  30 tablet  0  . carvedilol (COREG) 25 MG tablet Take 1 tablet (25 mg total) by mouth 2 (two) times daily with a meal.  60 tablet  6  . chlorthalidone (HYGROTON) 25 MG tablet Take 1/2 tablet by mouth daily  15 tablet  0  . furosemide (LASIX) 40 MG tablet Take 3 tablets (120 mg total) by mouth every morning.  90 tablet  4  . hydrALAZINE (APRESOLINE) 50 MG tablet Take 50 mg by mouth 3 (three) times daily. Takes 2 tablets      . isosorbide mononitrate (IMDUR) 60 MG 24 hr tablet Take 60 mg by mouth daily. 2 tablets three times daily      . losartan (COZAAR) 50 MG tablet Take 1 tablet (50 mg total) by mouth daily.  30 tablet  6  . nitroGLYCERIN  (NITROSTAT) 0.4 MG SL tablet Place 0.4 mg under the tongue every 5 (five) minutes x 3 doses as needed. For chest pain      . potassium chloride SA (K-DUR,KLOR-CON) 20 MEQ tablet Take 2 tablets (40 mEq total) by mouth 2 (two) times daily.  180 tablet  6  . warfarin (COUMADIN) 5 MG tablet Take 2.5-5 mg by mouth daily. Take 1/2 tablet (2.5 mg) on Mondays and take 1 tablet (5 mg) on all other days       No current facility-administered medications for this visit.    Allergies  Allergen Reactions  . Ace Inhibitors Other (See Comments)    Unknown reaction  . Amlodipine Swelling  . Clonidine Derivatives Other (See Comments)    Unknown reaction    Past Medical History  Diagnosis Date  . CAD (coronary artery disease)     myoview 03/02/12-EF 32%, mod fixed defect in the inf wall without significant ischemia; a. s/p BMS to mLAD 2000;  b. LHC 3/12:  sig ISR mLAD, RI 80-90%, Dx1 90% (too small for PCI);  PCI:  ION DES to mLAD, ION DES to RI  . HTN (hypertension)   .  Hypercholesteremia   . CVA (cerebral infarction)   . CRI (chronic renal insufficiency)     Baseline creatinine of 1.4 to 1.7  . 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 Atrial fibrillation 11/13/2011    coumadin and amiodarone; s/p DCCV November 2013; repeat DCCV Feb 13, 2012  . Left atrial thrombus     TEE 11/2011  . Iron deficiency anemia   . Cardiomyopathy, ischemic     echo 02/28/12-Ef 25%, mod mr, mild-mod TR, mod pul htn; refuses ICD    BP 150/92  Pulse 64 (standing);   BP 164/84  Pulse 64  (seated)  ASSESSMENT AND PLAN:  We had a long discussion about the importance of knowing what his meds are for and making sure he understands why he must bring all med to every medical appointment.  He still doesn't seem to grasp that we cannot switch back to labetolol, which he is convinced will control his BP.  This was explained again today.  I have decreased the Isosorbide further to 1 tablet daly and will continue the  hydralazine 75mg  tid and carvedilol 25mg  bid.  We are aslo going to add chlorthalidone 12.5mg  daily and repeat BMP in 2 weeks to be sure creatinine is not affected.  May also consider trial of ACEI, although will wait to see how he responds to the chlorthalidone.  I will see him back in 4 weeks for another BP check.  Phillips Hay PharmD CPP [facility]

## 2012-07-23 NOTE — Patient Instructions (Signed)
Your blood pressure today is high at 162/88, (standing 150/92)  Go to the lab in 2 weeks and get your bloodwork done.  . Check your blood pressure at home about 3 times per week if able) and keep record of the readings.  Take your BP meds as follows:  AM: carvedilol 25mg  - 1 tablet         Hydralazine 50mg  - 1.5 tablets         Isosorbide 60mg  - 1 tablet until 6/16 then decrease to 1/2 tablet         Chlorthalidone 25mg  - 1/2 tablet   AFTERNOON: - hydralazine 50mg   - 1.5 tablets   PM: carcedilol 25mg  -1 tablet         Hydralazine 50mg   - 1.5 tablets         Isosorbide 60mg  - 1 tablet until 6/16 then decrease to 1/2 tablet  Bring all of your meds, your BP cuff and your record of home blood pressures to your next appointment.  Exercise as you're able, try to walk approximately 30 minutes per day.  Keep salt intake to a minimum, especially watch canned and prepared boxed foods.  Eat more fresh fruits and vegetables and fewer canned items.  Avoid eating in fast food restaurants.   HOW TO TAKE YOUR BLOOD PRESSURE:   Rest 5 minutes before taking your blood pressure.    Don't smoke or drink caffeinated beverages for at least 30 minutes before.   Take your blood pressure before (not after) you eat.   Sit comfortably with your back supported and both feet on the floor (don't cross your legs).   Elevate your arm to heart level on a table or a desk.   Use the proper sized cuff. It should fit smoothly and snugly around your bare upper arm. There should be enough room to slip a fingertip under the cuff. The bottom edge of the cuff should be 1 inch above the crease of the elbow.   Ideally, take 3 measurements at one sitting and record the average.

## 2012-08-11 ENCOUNTER — Other Ambulatory Visit: Payer: Self-pay

## 2012-08-11 ENCOUNTER — Other Ambulatory Visit: Payer: Self-pay | Admitting: Cardiology

## 2012-08-11 MED ORDER — AMIODARONE HCL 200 MG PO TABS: 200.0000 mg | ORAL_TABLET | Freq: Every day | ORAL | Status: DC

## 2012-08-11 NOTE — Telephone Encounter (Signed)
Rx was sent to pharmacy electronically. 

## 2012-08-11 NOTE — Telephone Encounter (Signed)
amiodarone (PACERONE) 400 MG tablet  Take 1 tablet (400 mg total) by mouth daily.   30 tablet   6   Patient Instructions  Continue your current mediation We will check blood work today- we will call with the results. I will see you in one month.

## 2012-08-12 ENCOUNTER — Other Ambulatory Visit: Payer: Self-pay

## 2012-08-12 LAB — BASIC METABOLIC PANEL WITH GFR
BUN: 42 mg/dL — ABNORMAL HIGH (ref 6–23)
Calcium: 9.2 mg/dL (ref 8.4–10.5)
GFR, Est African American: 37 mL/min — ABNORMAL LOW
Glucose, Bld: 113 mg/dL — ABNORMAL HIGH (ref 70–99)

## 2012-08-12 MED ORDER — NITROGLYCERIN 0.4 MG SL SUBL: 0.4000 mg | SUBLINGUAL_TABLET | SUBLINGUAL | Status: AC | PRN

## 2012-08-12 NOTE — Telephone Encounter (Signed)
Rx was sent to pharmacy electronically. 

## 2012-08-13 ENCOUNTER — Telehealth: Payer: Self-pay | Admitting: Pharmacist Clinician (PhC)/ Clinical Pharmacy Specialist

## 2012-08-13 DIAGNOSIS — I10 Essential (primary) hypertension: Secondary | ICD-10-CM

## 2012-08-13 NOTE — Telephone Encounter (Signed)
Reviewed labs with Nada Boozer NP - Will d/c chlorthalidone 12.5mg  and have patient take extra of potassium for 2 days.  Will mail lab order for pt to have BMET repeated in 10 days.  Spoke with patient who voiced understanding.

## 2012-08-17 ENCOUNTER — Other Ambulatory Visit: Payer: Self-pay | Admitting: *Deleted

## 2012-08-20 ENCOUNTER — Ambulatory Visit: Payer: Medicare Other | Admitting: Cardiovascular Disease

## 2012-08-24 ENCOUNTER — Ambulatory Visit (INDEPENDENT_AMBULATORY_CARE_PROVIDER_SITE_OTHER): Payer: Medicare Other | Admitting: Pharmacist Clinician (PhC)/ Clinical Pharmacy Specialist

## 2012-08-24 ENCOUNTER — Encounter: Payer: Self-pay | Admitting: Pharmacist Clinician (PhC)/ Clinical Pharmacy Specialist

## 2012-08-24 VITALS — BP 160/80 | HR 64

## 2012-08-24 DIAGNOSIS — I513 Intracardiac thrombosis, not elsewhere classified: Secondary | ICD-10-CM

## 2012-08-24 DIAGNOSIS — I5189 Other ill-defined heart diseases: Secondary | ICD-10-CM

## 2012-08-24 DIAGNOSIS — I4891 Unspecified atrial fibrillation: Secondary | ICD-10-CM

## 2012-08-24 DIAGNOSIS — I10 Essential (primary) hypertension: Secondary | ICD-10-CM

## 2012-08-24 DIAGNOSIS — Z7901 Long term (current) use of anticoagulants: Secondary | ICD-10-CM

## 2012-08-24 NOTE — Patient Instructions (Addendum)
Your blood pressure today is normal at 144/80. Check your blood pressure at home daily (if able) and keep record of the readings.  Record your heart rate as well.  Continue taking all of your meds as prescribed  Bring all of your meds, your BP cuff and your record of home blood pressures to your next appointment.  Exercise as you're able, try to walk approximately 30 minutes per day.  Keep salt intake to a minimum, especially watch canned and prepared boxed foods.  Eat more fresh fruits and vegetables and fewer canned items.  Avoid eating in fast food restaurants.    Go to the lab today to repeat BMP to check potassium level
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
## 2012-08-26 ENCOUNTER — Encounter: Payer: Self-pay | Admitting: Cardiovascular Disease

## 2012-08-26 NOTE — Progress Notes (Signed)
Patient ID: Erik Adams, male   DOB: [birth year], 67 y.o.   MRN: [id number]     HPI: Erik Adams, is a 67 y.o. male who presents to the office today in followup of his ischemic cardiomyopathy.  Erik Adams is a former patient of Dr. Peter Swaziland. In 2000 he underwent bare-metal stenting to his LAD in March 2012 underwent DES stenting to his LAD and ramus intermediate vessel. 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 In January 2014 he was hospitalized with increasing shortness of breath and was profoundly hypoxic, febrile and required high level support. Ejection fraction on Myoview imaging was 32% an echo Doppler assessment was 25% with restrictive physiology. I initially saw him in March 2014 to establish care with me I try to supervise his medications. That time, I did initiate by Erik Adams rather than the hydralazine that he was taking most likely incorrect. That time he was also intermittently only taking isosorbide dinitrate I saw him in followup on 06/04/2012 tried again to further consolidate his medications. At that time I further titrate his by Erik Adams to 2 tablets twice a day for 2 weeks and then recommended he increase this to 2 tablets 3 times a day. Also discusses potential need for potential future defibrillator insertion but at the time he was pretty adamant against having this. He presents to the office today June 10 for followup evaluation. However, he is confused with his medications and he only brought with him 2 of the pills that he is taking. Not sure he is if he is taking these correctly but he tells me he was taking an old prescription of imdur and he was taking this 3 times a day. He also is taking hydralazine. I do not beliecve that he was taking the BiDil.   Past Medical History  Diagnosis  Date  . CAD (coronary artery disease)     myoview 03/02/12-EF 32%, mod fixed defect in the inf wall without significant ischemia; a. s/p BMS to mLAD 2000;  b. LHC 3/12:  sig ISR mLAD, RI 80-90%, Dx1 90% (too small for PCI);  PCI:  ION DES to mLAD, ION DES to RI  . HTN (hypertension)   . Hypercholesteremia   . CVA (cerebral infarction)   . CRI (chronic renal insufficiency)     Baseline creatinine of 1.4 to 1.7  . 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 Atrial fibrillation 11/13/2011    coumadin and amiodarone; s/p DCCV November 2013; repeat DCCV Feb 13, 2012  . Left atrial thrombus     TEE 11/2011  . Iron deficiency anemia   . Cardiomyopathy, ischemic     echo 02/28/12-Ef 25%, mod mr, mild-mod TR, mod pul htn; refuses ICD    Past Surgical History  Procedure Laterality Date  . Coronary angioplasty with stent placement  2000    BMS to the LAD  . Mastoidectomy    . Coronary angioplasty with stent placement  March 2012    DES to LAD and Ramus intermdius  . Tee without cardioversion  12/06/2011    Procedure: TRANSESOPHAGEAL ECHOCARDIOGRAM (TEE);  Surgeon: Pricilla Riffle, MD;  Location: [facility] ENDOSCOPY;  Service: Cardiovascular;  Laterality: N/A;  . Cardioversion  01/06/2012    Procedure: CARDIOVERSION;  Surgeon: Wendall Stade, MD;  Location: [facility] ENDOSCOPY;  Service: Cardiovascular;  Laterality: N/A;  call anesthesia  maybe able to do cardio. early at 12:00  . Cardioversion  02/13/2012    Procedure: CARDIOVERSION;  Surgeon: Cassell Clement, MD;  Location: [facility] ENDOSCOPY;  Service: Cardiovascular;  Laterality: N/A;  . Tonsillectomy    . Cholecystectomy  1999  . Left inguinal hernia repair  02/2007  . Hernia repair    . Coronary angioplasty with stent placement  04/2000    DES to LAD and ramus intermediate vessel    Allergies  Allergen Reactions  . Ace Inhibitors Other (See Comments)    Unknown reaction  . Amlodipine Swelling  . Clonidine Derivatives Other (See Comments)     Unknown reaction    Current Outpatient Prescriptions  Medication Sig Dispense Refill  . aspirin 81 MG chewable tablet Chew 1 tablet (81 mg total) by mouth daily.      Marland Kitchen atorvastatin (LIPITOR) 20 MG tablet Place 1 tablet (20 mg total) into feeding tube daily at 6 PM.  30 tablet  0  . carvedilol (COREG) 25 MG tablet Take 1 tablet (25 mg total) by mouth 2 (two) times daily with a meal.  60 tablet  6  . furosemide (LASIX) 40 MG tablet Take 3 tablets (120 mg total) by mouth every morning.  90 tablet  4  . hydrALAZINE (APRESOLINE) 50 MG tablet Take 50 mg by mouth 3 (three) times daily. Takes 2 tablets      . isosorbide mononitrate (IMDUR) 60 MG 24 hr tablet Take 60 mg by mouth daily.       . potassium chloride SA (K-DUR,KLOR-CON) 20 MEQ tablet Take 2 tablets (40 mEq total) by mouth 2 (two) times daily.  180 tablet  6  . warfarin (COUMADIN) 5 MG tablet Take 2.5-5 mg by mouth daily. Take 1/2 tablet (2.5 mg) on Mondays and take 1 tablet (5 mg) on all other days      . amiodarone (PACERONE) 200 MG tablet Take 1 tablet (200 mg total) by mouth daily.  30 tablet  6  . nitroGLYCERIN (NITROSTAT) 0.4 MG SL tablet Place 1 tablet (0.4 mg total) under the tongue every 5 (five) minutes x 3 doses as needed. For chest pain  25 tablet  6   No current facility-administered medications for this visit.    Socially he is widowed has 5 children 9 grandchildren no great-grandchildren. His wife died of brain cancer. 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 His 5 brothers 2 sisters and 5 children.  ROS is negative for fevers, chills or night sweats. He denies recent chest tightness. He denies palpitations. At times has some mild shortness of breath. He is unaware of any palpitations or arrhythmias. He denies indigestion. Chest pain. He denies bleeding. He denies paresthesias. He denies significant edema.   Other system review is negative.  PE BP 178/100  Pulse 65  Ht 5\' 10"  (1.778 m)  Wt 166  lb 3.2 oz (75.388 kg)  BMI 23.85 kg/m2  General: Alert, oriented, no distress.  Skin: normal turgor, no rashes HEENT: Normocephalic, atraumatic. Pupils round and reactive; sclera anicteric;no lid lag.  Nose without nasal septal hypertrophy Mouth/Parynx benign; Mallinpatti scale 3  Neck: No JVD, no carotid briuts Lungs: clear to ausculatation and percussion; no wheezing or rales Heart: RRR, s1 s2 normal 1/6 systolic murmur. No S3 gallop. Abdomen: soft, nontender; no hepatosplenomehaly, BS+; abdominal aorta nontender and not dilated by palpation. Pulses 2+ Extremities: no clubbing cyanosis or edema, Homan's sign negative  Neurologic: grossly nonfocal  ECG normal sinus rhythm with first-degree AV block the there is evidence for LVH with QRS widening and repolarization changes. PR interval 210 ms QTc interval 499 ms.  LABS:  BMET    Component Value Date/Time   NA 138 08/11/2012 1625   K 3.3* 08/11/2012 1625   CL 98 08/11/2012 1625   CO2 31 08/11/2012 1625   GLUCOSE 113* 08/11/2012 1625   BUN 42* 08/11/2012 1625   CREATININE 2.08* 08/11/2012 1625   CREATININE 1.7* 03/18/2012 1641   CALCIUM 9.2 08/11/2012 1625   GFRNONAA 41* 03/03/2012 0530   GFRAA 48* 03/03/2012 0530     Hepatic Function Panel     Component Value Date/Time   PROT 6.6 02/27/2012 1345   ALBUMIN 3.4* 02/27/2012 1345   AST 36 02/27/2012 1345   ALT 33 02/27/2012 1345   ALKPHOS 93 02/27/2012 1345   BILITOT 1.0 02/27/2012 1345   BILIDIR 0.2 11/13/2011 1617     CBC    Component Value Date/Time   WBC 7.9 03/02/2012 0327   RBC 3.96* 03/02/2012 0327   HGB 9.7* 03/02/2012 0327   HCT 31.3* 03/02/2012 0327   PLT 216 03/02/2012 0327   MCV 79.0 03/02/2012 0327   MCH 24.5* 03/02/2012 0327   MCHC 31.0 03/02/2012 0327   RDW 17.0* 03/02/2012 0327   LYMPHSABS 0.4* 02/27/2012 1345   MONOABS 1.1* 02/27/2012 1345   EOSABS 0.1 02/27/2012 1345   BASOSABS 0.0 02/27/2012 1345     BNP    Component Value Date/Time   PROBNP 1711.0* 03/18/2012 1641     Lipid Panel     Component Value Date/Time   CHOL 98 11/13/2011 1617   TRIG 43.0 11/13/2011 1617   HDL 34.50* 11/13/2011 1617   CHOLHDL 3 11/13/2011 1617   VLDL 8.6 11/13/2011 1617   LDLCALC 55 11/13/2011 1617     RADIOLOGY: No results found.    ASSESSMENT AND PLAN: Erik Adams is a 67 year old gentleman with an ischemic myopathy status post stenting of his LAD and ramus intermediate vessel. Has a history of atrial fibrillation and remote documented LAD thrombus, renal insufficiency, hypertension, hyperlipidemia remote CIA. Has history of restrictive physiology with an EF of 25% an echo Doppler assessment has a moderate fixed defect in inferior wall without ischemia on Myoview imaging. He is completely confused about what medications he is taking. He is uncertain if he is taking BiDil if he is taking Imdur and if he is taking Imdur he states is taking this 2 tablets 3 times a day. Consequently, it is difficult to determine what changes are necessary to optimize her medical management in light of the uncertainty as to actually what he is taking.  However, I have suggested he increase his hydralazine to 75 mg and take this at least twice a day and asked him to return to the office in 2 days to see Penni Bombard.D. and we have asked him to bring with him all his medications that he is taking. We will then discuss this together and additional recommendations will be made at that time.      Lennette Bihari, MD, [facility]  08/26/2012 10:23 PM

## 2012-08-27 LAB — BASIC METABOLIC PANEL
CO2: 28 mEq/L (ref 19–32)
Calcium: 9.3 mg/dL (ref 8.4–10.5)
Creat: 1.81 mg/dL — ABNORMAL HIGH (ref 0.50–1.35)
Glucose, Bld: 90 mg/dL (ref 70–99)

## 2012-08-28 ENCOUNTER — Encounter: Payer: Self-pay | Admitting: *Deleted

## 2012-09-09 ENCOUNTER — Encounter: Payer: Self-pay | Admitting: *Deleted

## 2012-09-14 ENCOUNTER — Telehealth: Payer: Self-pay | Admitting: Cardiology

## 2012-09-14 NOTE — Telephone Encounter (Signed)
New Prob  Pt is calling wanting to speak with Erik Adams, he said he called earlier and has spoken with someone else. He asked if she could please call him today.

## 2012-09-14 NOTE — Telephone Encounter (Signed)
New Prob    Pt has some questions regarding his last visit. Please call.

## 2012-09-14 NOTE — Telephone Encounter (Signed)
I have advised him to speak to the billing people.

## 2012-09-14 NOTE — Telephone Encounter (Signed)
Returned call to patient he stated he wanted to speak to Norma Fredrickson NP about his 02/18/12 office visit that was $ 273.00.Patient was told [facility]cal does not set the prices for office visits.Patient was told will need to speak to someone in the billing department.Patient was told Norma Fredrickson NP in clinic today.Message sent to billing.

## 2012-09-14 NOTE — Telephone Encounter (Signed)
Returned call to patient he stated he wanted to talk to Norma Fredrickson NP.Patient was asked if I could help him and he insisted he needs to speak to Lawson Fiscal about being over charged.Patient was told he needs to speak to billing.He said no he needs to speak to [place].Patient was told Lawson Fiscal is in the middle of clinic and she will not be able to return call to the end of the day.

## 2012-09-23 ENCOUNTER — Emergency Department (HOSPITAL_COMMUNITY): Payer: Medicare Other

## 2012-09-23 ENCOUNTER — Emergency Department (HOSPITAL_COMMUNITY)
Admission: EM | Admit: 2012-09-23 | Discharge: 2012-09-23 | Disposition: A | Discharge status: Home / Self Care | Payer: Medicare Other | Attending: Emergency Medicine | Admitting: Emergency Medicine

## 2012-09-23 ENCOUNTER — Encounter (HOSPITAL_COMMUNITY): Payer: Self-pay | Admitting: Emergency Medicine

## 2012-09-23 DIAGNOSIS — Z8673 Personal history of transient ischemic attack (TIA), and cerebral infarction without residual deficits: Secondary | ICD-10-CM | POA: Insufficient documentation

## 2012-09-23 DIAGNOSIS — Z7982 Long term (current) use of aspirin: Secondary | ICD-10-CM | POA: Insufficient documentation

## 2012-09-23 DIAGNOSIS — E7800 Pure hypercholesterolemia, unspecified: Secondary | ICD-10-CM | POA: Insufficient documentation

## 2012-09-23 DIAGNOSIS — I5040 Unspecified combined systolic (congestive) and diastolic (congestive) heart failure: Secondary | ICD-10-CM | POA: Insufficient documentation

## 2012-09-23 DIAGNOSIS — N189 Chronic kidney disease, unspecified: Secondary | ICD-10-CM | POA: Insufficient documentation

## 2012-09-23 DIAGNOSIS — M542 Cervicalgia: Secondary | ICD-10-CM

## 2012-09-23 DIAGNOSIS — I4891 Unspecified atrial fibrillation: Secondary | ICD-10-CM | POA: Insufficient documentation

## 2012-09-23 DIAGNOSIS — Z7901 Long term (current) use of anticoagulants: Secondary | ICD-10-CM | POA: Insufficient documentation

## 2012-09-23 DIAGNOSIS — Z87891 Personal history of nicotine dependence: Secondary | ICD-10-CM | POA: Insufficient documentation

## 2012-09-23 DIAGNOSIS — Z8679 Personal history of other diseases of the circulatory system: Secondary | ICD-10-CM | POA: Insufficient documentation

## 2012-09-23 DIAGNOSIS — Z862 Personal history of diseases of the blood and blood-forming organs and certain disorders involving the immune mechanism: Secondary | ICD-10-CM | POA: Insufficient documentation

## 2012-09-23 DIAGNOSIS — I2510 Atherosclerotic heart disease of native coronary artery without angina pectoris: Secondary | ICD-10-CM | POA: Insufficient documentation

## 2012-09-23 DIAGNOSIS — Z79899 Other long term (current) drug therapy: Secondary | ICD-10-CM | POA: Insufficient documentation

## 2012-09-23 DIAGNOSIS — I129 Hypertensive chronic kidney disease with stage 1 through stage 4 chronic kidney disease, or unspecified chronic kidney disease: Secondary | ICD-10-CM | POA: Insufficient documentation

## 2012-09-23 DIAGNOSIS — Z9861 Coronary angioplasty status: Secondary | ICD-10-CM | POA: Insufficient documentation

## 2012-09-23 LAB — CBC WITH DIFFERENTIAL/PLATELET
Basophils Relative: 0 % (ref 0–1)
Eosinophils Absolute: 0.1 10*3/uL (ref 0.0–0.7)
HCT: 34.2 % — ABNORMAL LOW (ref 39.0–52.0)
Hemoglobin: 10.8 g/dL — ABNORMAL LOW (ref 13.0–17.0)
Lymphs Abs: 1 10*3/uL (ref 0.7–4.0)
MCH: 27.2 pg (ref 26.0–34.0)
MCHC: 31.6 g/dL (ref 30.0–36.0)
Monocytes Absolute: 1.1 10*3/uL — ABNORMAL HIGH (ref 0.1–1.0)
Monocytes Relative: 10 % (ref 3–12)
Neutro Abs: 9.1 10*3/uL — ABNORMAL HIGH (ref 1.7–7.7)
Neutrophils Relative %: 80 % — ABNORMAL HIGH (ref 43–77)
RBC: 3.97 MIL/uL — ABNORMAL LOW (ref 4.22–5.81)

## 2012-09-23 LAB — BASIC METABOLIC PANEL
BUN: 37 mg/dL — ABNORMAL HIGH (ref 6–23)
Chloride: 100 mEq/L (ref 96–112)
Creatinine, Ser: 1.69 mg/dL — ABNORMAL HIGH (ref 0.50–1.35)
Glucose, Bld: 115 mg/dL — ABNORMAL HIGH (ref 70–99)
Potassium: 4.7 mEq/L (ref 3.5–5.1)

## 2012-09-23 MED ORDER — PREDNISONE 20 MG PO TABS: ORAL_TABLET | ORAL | Status: DC

## 2012-09-23 MED ORDER — LORAZEPAM 2 MG/ML IJ SOLN
0.5000 mg | Freq: Once | INTRAMUSCULAR | Status: AC
  Administered 2012-09-23: 0.5 mg via INTRAVENOUS
  Filled 2012-09-23: qty 1

## 2012-09-23 MED ORDER — OXYCODONE-ACETAMINOPHEN 5-325 MG PO TABS: 1.0000 | ORAL_TABLET | ORAL | Status: DC | PRN

## 2012-09-23 MED ORDER — KETOROLAC TROMETHAMINE 30 MG/ML IJ SOLN
30.0000 mg | Freq: Once | INTRAMUSCULAR | Status: AC
  Administered 2012-09-23: 30 mg via INTRAVENOUS
  Filled 2012-09-23: qty 1

## 2012-09-23 NOTE — ED Notes (Signed)
C/o shoulder pain since last night with radiation into neck.  States pain with raising arms.  Took prescribed 4 K+ tabs (unk meq) and took 5 more through the night.  Appears in acute pain with movement.  Patient states "think my potassium is low".

## 2012-09-23 NOTE — ED Provider Notes (Signed)
CSN: [id number]     Arrival date & time 09/23/12  4098 History     First MD Initiated Contact with Patient 09/23/12 0751     Chief Complaint  Patient presents with  . Shoulder Pain   (Consider location/radiation/quality/duration/timing/severity/associated sxs/prior Treatment) HPI.... neck pain since 1900 yesterday.   No radicular symptoms into his arms.   Patient did some physical labor which may be related to pain. No meningeal signs, fever, chills, neuro deficits.  patient took several potassium pills thinking his potassium was low.   Severity is mild or moderate.  Movement and palpation makes symptoms worse  Past Medical History  Diagnosis Date  . CAD (coronary artery disease)     myoview 03/02/12-EF 32%, mod fixed defect in the inf wall without significant ischemia; a. s/p BMS to mLAD 2000;  b. LHC 3/12:  sig ISR mLAD, RI 80-90%, Dx1 90% (too small for PCI);  PCI:  ION DES to mLAD, ION DES to RI  . HTN (hypertension)   . Hypercholesteremia   . CVA (cerebral infarction)   . CRI (chronic renal insufficiency)     Baseline creatinine of 1.4 to 1.7  . 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 Atrial fibrillation 11/13/2011    coumadin and amiodarone; s/p DCCV November 2013; repeat DCCV Feb 13, 2012  . Left atrial thrombus     TEE 11/2011  . Iron deficiency anemia   . Cardiomyopathy, ischemic     echo 02/28/12-Ef 25%, mod mr, mild-mod TR, mod pul htn; refuses ICD   Past Surgical History  Procedure Laterality Date  . Coronary angioplasty with stent placement  2000    BMS to the LAD  . Mastoidectomy    . Coronary angioplasty with stent placement  March 2012    DES to LAD and Ramus intermdius  . Tee without cardioversion  12/06/2011    Procedure: TRANSESOPHAGEAL ECHOCARDIOGRAM (TEE);  Surgeon: Pricilla Riffle, MD;  Location: [facility] ENDOSCOPY;  Service: Cardiovascular;  Laterality: N/A;  . Cardioversion  01/06/2012    Procedure: CARDIOVERSION;  Surgeon: Wendall Stade, MD;   Location: [facility] ENDOSCOPY;  Service: Cardiovascular;  Laterality: N/A;  call anesthesia  maybe able to do cardio. early at 12:00  . Cardioversion  02/13/2012    Procedure: CARDIOVERSION;  Surgeon: Cassell Clement, MD;  Location: [facility] ENDOSCOPY;  Service: Cardiovascular;  Laterality: N/A;  . Tonsillectomy    . Cholecystectomy  1999  . Left inguinal hernia repair  02/2007  . Hernia repair    . Coronary angioplasty with stent placement  04/2000    DES to LAD and ramus intermediate vessel   Family History  Problem Relation Age of Onset  . Heart disease Father    History  Substance Use Topics  . Smoking status: Former Smoker    Types: Cigarettes    Quit date: 02/11/1974  . Smokeless tobacco: Not on file  . Alcohol Use: No    Review of Systems  All other systems reviewed and are negative.    Allergies  Ace inhibitors; Amlodipine; and Clonidine derivatives  Home Medications   Current Outpatient Rx  Name  Route  Sig  Dispense  Refill  . amiodarone (PACERONE) 200 MG tablet   Oral   Take 1 tablet (200 mg total) by mouth daily.   30 tablet   6   . aspirin 81 MG chewable tablet   Oral   Chew 1 tablet (81 mg total) by mouth daily.         Marland Kitchen  atorvastatin (LIPITOR) 20 MG tablet   Per Tube   Place 1 tablet (20 mg total) into feeding tube daily at 6 PM.   30 tablet   0   . carvedilol (COREG) 25 MG tablet   Oral   Take 1 tablet (25 mg total) by mouth 2 (two) times daily with a meal.   60 tablet   6   . furosemide (LASIX) 40 MG tablet   Oral   Take 3 tablets (120 mg total) by mouth every morning.   90 tablet   4   . hydrALAZINE (APRESOLINE) 50 MG tablet   Oral   Take 50 mg by mouth 3 (three) times daily. Takes 2 tablets         . isosorbide mononitrate (IMDUR) 60 MG 24 hr tablet   Oral   Take 60 mg by mouth daily.          . nitroGLYCERIN (NITROSTAT) 0.4 MG SL tablet   Sublingual   Place 1 tablet (0.4 mg total) under the tongue every 5 (five) minutes x 3 doses as  needed. For chest pain   25 tablet   6   . potassium chloride SA (K-DUR,KLOR-CON) 20 MEQ tablet   Oral   Take 2 tablets (40 mEq total) by mouth 2 (two) times daily.   180 tablet   6   . warfarin (COUMADIN) 5 MG tablet   Oral   Take 2.5-5 mg by mouth daily. Take 1/2 tablet (2.5 mg) on Mondays and take 1 tablet (5 mg) on all other days          BP 231/99  Pulse 68  Temp(Src) 98.9 F (37.2 C) (Oral)  SpO2 96% Physical Exam  Nursing note and vitals reviewed. Constitutional: He is oriented to person, place, and time. He appears well-developed and well-nourished.  HENT:  Head: Normocephalic and atraumatic.  Eyes: Conjunctivae and EOM are normal. Pupils are equal, round, and reactive to light.  Neck:  Gen. muscular tenderness posterior neck. No meningeal signs  Cardiovascular: Normal rate, regular rhythm and normal heart sounds.   Pulmonary/Chest: Effort normal and breath sounds normal.  Abdominal: Soft. Bowel sounds are normal.  Musculoskeletal: Normal range of motion.  Neurological: He is alert and oriented to person, place, and time.  Skin: Skin is warm and dry.  Psychiatric: He has a normal mood and affect.    ED Course   Procedures (including critical care time)  Labs Reviewed  CBC WITH DIFFERENTIAL - Abnormal; Notable for the following:    WBC 11.4 (*)    RBC 3.97 (*)    Hemoglobin 10.8 (*)    HCT 34.2 (*)    RDW 17.1 (*)    Neutrophils Relative % 80 (*)    Neutro Abs 9.1 (*)    Lymphocytes Relative 9 (*)    Monocytes Absolute 1.1 (*)    All other components within normal limits  PROTIME-INR - Abnormal; Notable for the following:    Prothrombin Time 25.4 (*)    INR 2.40 (*)    All other components within normal limits  BASIC METABOLIC PANEL   No results found. No diagnosis found.     Date: 09/23/2012  Rate:68  Rhythm: normal sinus rhythm  QRS Axis: normal  Intervals: normal  ST/T Wave abnormalities: normal  Conduction Disutrbances:first-degree  A-V block  and left bundle branch block  Narrative Interpretation:   Old EKG Reviewed: changes noted pvc MDM  No clinical evidence of meningitis. Suspect muscular  strain.Patient feels better after IV Toradol and IV Ativan.   Discharge meds Percocet and prednisone. Patient feels much better at discharge.    Donnetta Hutching, MD 09/23/12 1130

## 2012-09-24 ENCOUNTER — Ambulatory Visit (INDEPENDENT_AMBULATORY_CARE_PROVIDER_SITE_OTHER): Payer: Medicare Other | Admitting: Pharmacist Clinician (PhC)/ Clinical Pharmacy Specialist

## 2012-09-24 VITALS — BP 190/100 | HR 72

## 2012-09-24 DIAGNOSIS — Z7901 Long term (current) use of anticoagulants: Secondary | ICD-10-CM

## 2012-09-24 DIAGNOSIS — I513 Intracardiac thrombosis, not elsewhere classified: Secondary | ICD-10-CM

## 2012-09-24 DIAGNOSIS — I5189 Other ill-defined heart diseases: Secondary | ICD-10-CM

## 2012-09-24 DIAGNOSIS — I10 Essential (primary) hypertension: Secondary | ICD-10-CM

## 2012-09-24 DIAGNOSIS — I4891 Unspecified atrial fibrillation: Secondary | ICD-10-CM

## 2012-09-24 LAB — POCT INR: INR: 2.3

## 2012-09-24 MED ORDER — HYDRALAZINE HCL 100 MG PO TABS: 100.0000 mg | ORAL_TABLET | Freq: Three times a day (TID) | ORAL | Status: DC

## 2012-09-24 NOTE — Patient Instructions (Addendum)
Your blood pressure today is elevated at 190/100. Check your blood pressure at home 2-3 times per week (if able) and keep record of the readings.  Increase your hydralazine to 2 whole tablets three times each day - morning, lunch and evening, every day, don't miss any doses  Bring all of your meds, your BP cuff and your record of home blood pressures to your next appointment.  Exercise as you're able, try to walk approximately 30 minutes per day.  Keep salt intake to a minimum, especially watch canned and prepared boxed foods.  Eat more fresh fruits and vegetables and fewer canned items.  Avoid eating in fast food restaurants.

## 2012-09-28 ENCOUNTER — Encounter: Payer: Self-pay | Admitting: Pharmacist Clinician (PhC)/ Clinical Pharmacy Specialist

## 2012-09-28 NOTE — Progress Notes (Signed)
HPI:  Erik Adams is a 67 y.o. male with a PMH below who presents today for a follow up blood pressure check.  He has been having severe neck pain and went to ER yesterday after trying to self-medicate.  He believed that the neck pain was due to low potassium and took 9 of his Klor-Con tabs before going to ER.  States pain level is better today, but still present. States took all other meds as prescribed, no missed doses.  His blood pressure remains elevated today, probably in some part due to neck pain.  He was previously on losartan then irbesartan, however he had a jump in creatinine from 1.7 to 2.3 so this was discontinued. He states severe swelling with amlodipine, so we have few options for BP control.  Current Outpatient Prescriptions  Medication Sig Dispense Refill  . amiodarone (PACERONE) 200 MG tablet Take 1 tablet (200 mg total) by mouth daily.  30 tablet  6  . aspirin 81 MG chewable tablet Chew 1 tablet (81 mg total) by mouth daily.      . carvedilol (COREG) 25 MG tablet Take 1 tablet (25 mg total) by mouth 2 (two) times daily with a meal.  60 tablet  6  . furosemide (LASIX) 40 MG tablet Take 3 tablets (120 mg total) by mouth every morning.  90 tablet  4  . hydrALAZINE (APRESOLINE) 100 MG tablet Take 1 tablet (100 mg total) by mouth 3 (three) times daily.  90 tablet  4  . isosorbide mononitrate (IMDUR) 60 MG 24 hr tablet Take 60 mg by mouth daily.       . nitroGLYCERIN (NITROSTAT) 0.4 MG SL tablet Place 1 tablet (0.4 mg total) under the tongue every 5 (five) minutes x 3 doses as needed. For chest pain  25 tablet  6  . oxyCODONE-acetaminophen (PERCOCET) 5-325 MG per tablet Take 1 tablet by mouth every 4 (four) hours as needed for pain.  20 tablet  0  . potassium chloride SA (K-DUR,KLOR-CON) 20 MEQ tablet Take 2 tablets (40 mEq total) by mouth 2 (two) times daily.  180 tablet  6  . predniSONE (DELTASONE) 20 MG tablet 3 tabs po day one, then 2 po daily x 4 days  11 tablet  0  .  warfarin (COUMADIN) 5 MG tablet Take 2.5-5 mg by mouth daily. Take 1/2 tablet (2.5 mg) on Mondays and take 1 tablet (5 mg) on all other days       No current facility-administered medications for this visit.    Allergies  Allergen Reactions  . Amlodipine Shortness Of Breath, Swelling and Palpitations    "Caused  Heart condition-per patient"  . Ace Inhibitors Other (See Comments)    Unknown reaction  . Clonidine Derivatives Other (See Comments)    Unknown reaction    Past Medical History  Diagnosis Date  . CAD (coronary artery disease)     myoview 03/02/12-EF 32%, mod fixed defect in the inf wall without significant ischemia; a. s/p BMS to mLAD 2000;  b. LHC 3/12:  sig ISR mLAD, RI 80-90%, Dx1 90% (too small for PCI);  PCI:  ION DES to mLAD, ION DES to RI  . HTN (hypertension)   . Hypercholesteremia   . CVA (cerebral infarction)   . CRI (chronic renal insufficiency)     Baseline creatinine of 1.4 to 1.7  . 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 Atrial fibrillation 11/13/2011    coumadin and amiodarone;  s/p DCCV November 2013; repeat DCCV Feb 13, 2012  . Left atrial thrombus     TEE 11/2011  . Iron deficiency anemia   . Cardiomyopathy, ischemic     echo 02/28/12-Ef 25%, mod mr, mild-mod TR, mod pul htn; refuses ICD    BP today sitting :  190/100  ASSESSMENT AND PLAN:  We had a discussion on the importance of NEVER taking 9 tablets of ANYTHING.  Explained the dangers of overdosing on any meds and that he can always find an ER or on-call provider to help with his medical needs.  As for his blood pressure will increase hydralazine to 100mg  tid.  We will continue to follow blood pressures during INR checks in the future.  I have asked him to take his BP at home 3-4 times per week and to let us know if he has any problems.  Phillips Hay, RPH-CPP

## 2012-10-02 NOTE — Telephone Encounter (Signed)
Returned call to patient he stated he wanted to tell me the reason he changed Drs.Stated his last hospitalization was caused by amlodipine not being stopped.

## 2012-10-02 NOTE — Telephone Encounter (Signed)
New Problem  Pt states he wants to talk about medications// Didnt want to go into detail.

## 2012-10-22 ENCOUNTER — Ambulatory Visit (INDEPENDENT_AMBULATORY_CARE_PROVIDER_SITE_OTHER): Payer: Medicare Other | Admitting: Pharmacist Clinician (PhC)/ Clinical Pharmacy Specialist

## 2012-10-22 ENCOUNTER — Other Ambulatory Visit: Payer: Self-pay | Admitting: Pharmacist Clinician (PhC)/ Clinical Pharmacy Specialist

## 2012-10-22 ENCOUNTER — Encounter: Payer: Medicare Other | Admitting: Pharmacist Clinician (PhC)/ Clinical Pharmacy Specialist

## 2012-10-22 DIAGNOSIS — I4891 Unspecified atrial fibrillation: Secondary | ICD-10-CM

## 2012-10-22 DIAGNOSIS — I513 Intracardiac thrombosis, not elsewhere classified: Secondary | ICD-10-CM

## 2012-10-22 DIAGNOSIS — I5189 Other ill-defined heart diseases: Secondary | ICD-10-CM

## 2012-10-22 DIAGNOSIS — Z7901 Long term (current) use of anticoagulants: Secondary | ICD-10-CM

## 2012-10-22 LAB — POCT INR: INR: 2.4

## 2012-10-22 MED ORDER — WARFARIN SODIUM 5 MG PO TABS: ORAL_TABLET | ORAL | Status: DC

## 2012-11-11 ENCOUNTER — Other Ambulatory Visit: Payer: Self-pay | Admitting: *Deleted

## 2012-11-11 MED ORDER — ISOSORBIDE MONONITRATE ER 60 MG PO TB24: 60.0000 mg | ORAL_TABLET | Freq: Every day | ORAL | Status: DC

## 2012-11-19 ENCOUNTER — Ambulatory Visit (INDEPENDENT_AMBULATORY_CARE_PROVIDER_SITE_OTHER): Payer: Medicare Other | Admitting: Pharmacist Clinician (PhC)/ Clinical Pharmacy Specialist

## 2012-11-19 VITALS — BP 184/92 | HR 64

## 2012-11-19 DIAGNOSIS — Z7901 Long term (current) use of anticoagulants: Secondary | ICD-10-CM

## 2012-11-19 DIAGNOSIS — I513 Intracardiac thrombosis, not elsewhere classified: Secondary | ICD-10-CM

## 2012-11-19 DIAGNOSIS — I5189 Other ill-defined heart diseases: Secondary | ICD-10-CM

## 2012-11-19 DIAGNOSIS — I4891 Unspecified atrial fibrillation: Secondary | ICD-10-CM

## 2012-11-19 LAB — POCT INR: INR: 2.6

## 2012-11-25 ENCOUNTER — Other Ambulatory Visit: Payer: Self-pay | Admitting: *Deleted

## 2012-11-25 MED ORDER — FUROSEMIDE 40 MG PO TABS: 120.0000 mg | ORAL_TABLET | Freq: Every morning | ORAL | Status: DC

## 2012-11-25 NOTE — Telephone Encounter (Signed)
Rx was sent to pharmacy electronically. 

## 2012-12-17 ENCOUNTER — Ambulatory Visit (INDEPENDENT_AMBULATORY_CARE_PROVIDER_SITE_OTHER): Payer: Medicare Other | Admitting: Pharmacist Clinician (PhC)/ Clinical Pharmacy Specialist

## 2012-12-17 VITALS — BP 180/80 | HR 76

## 2012-12-17 DIAGNOSIS — I513 Intracardiac thrombosis, not elsewhere classified: Secondary | ICD-10-CM

## 2012-12-17 DIAGNOSIS — I4891 Unspecified atrial fibrillation: Secondary | ICD-10-CM

## 2012-12-17 DIAGNOSIS — Z7901 Long term (current) use of anticoagulants: Secondary | ICD-10-CM

## 2012-12-17 DIAGNOSIS — I5043 Acute on chronic combined systolic (congestive) and diastolic (congestive) heart failure: Secondary | ICD-10-CM

## 2012-12-17 DIAGNOSIS — I2510 Atherosclerotic heart disease of native coronary artery without angina pectoris: Secondary | ICD-10-CM

## 2012-12-17 DIAGNOSIS — I5040 Unspecified combined systolic (congestive) and diastolic (congestive) heart failure: Secondary | ICD-10-CM

## 2012-12-17 DIAGNOSIS — I5189 Other ill-defined heart diseases: Secondary | ICD-10-CM

## 2012-12-17 LAB — POCT INR: INR: 3.1

## 2012-12-22 ENCOUNTER — Other Ambulatory Visit: Payer: Self-pay | Admitting: Pharmacist Clinician (PhC)/ Clinical Pharmacy Specialist

## 2012-12-22 NOTE — Telephone Encounter (Signed)
Rx was sent to pharmacy electronically. 

## 2013-01-14 ENCOUNTER — Ambulatory Visit: Payer: Medicare Other | Admitting: Pharmacist Clinician (PhC)/ Clinical Pharmacy Specialist

## 2013-01-19 ENCOUNTER — Ambulatory Visit: Payer: Medicare Other | Admitting: Pharmacist Clinician (PhC)/ Clinical Pharmacy Specialist

## 2013-01-20 ENCOUNTER — Ambulatory Visit: Payer: Medicare Other | Admitting: Pharmacist Clinician (PhC)/ Clinical Pharmacy Specialist

## 2013-01-20 ENCOUNTER — Ambulatory Visit (INDEPENDENT_AMBULATORY_CARE_PROVIDER_SITE_OTHER): Payer: Medicare Other | Admitting: Pharmacist Clinician (PhC)/ Clinical Pharmacy Specialist

## 2013-01-20 VITALS — BP 148/80 | HR 84

## 2013-01-20 DIAGNOSIS — I4891 Unspecified atrial fibrillation: Secondary | ICD-10-CM

## 2013-01-20 DIAGNOSIS — I5189 Other ill-defined heart diseases: Secondary | ICD-10-CM

## 2013-01-20 DIAGNOSIS — Z7901 Long term (current) use of anticoagulants: Secondary | ICD-10-CM

## 2013-01-20 DIAGNOSIS — I513 Intracardiac thrombosis, not elsewhere classified: Secondary | ICD-10-CM

## 2013-01-20 LAB — POCT INR: INR: 2.2

## 2013-01-22 ENCOUNTER — Other Ambulatory Visit: Payer: Self-pay | Admitting: Pharmacist Clinician (PhC)/ Clinical Pharmacy Specialist

## 2013-01-22 MED ORDER — ISOSORBIDE MONONITRATE ER 60 MG PO TB24: 60.0000 mg | ORAL_TABLET | Freq: Two times a day (BID) | ORAL | Status: DC

## 2013-01-22 NOTE — Telephone Encounter (Signed)
Pt was in office for INR check on 12.10.  States that he had increased his imdur to bid because of recurrent chest pains.  States feels much better when taking bid.  Reviewed with Dr. Tresa Endo on 12.11 and was okayed to increase med to 60mg  bid.  Rx sent to pharmacy.

## 2013-02-22 ENCOUNTER — Ambulatory Visit (INDEPENDENT_AMBULATORY_CARE_PROVIDER_SITE_OTHER): Payer: Medicare Other | Admitting: Pharmacist Clinician (PhC)/ Clinical Pharmacy Specialist

## 2013-02-22 VITALS — BP 176/96 | HR 68

## 2013-02-22 DIAGNOSIS — Z7901 Long term (current) use of anticoagulants: Secondary | ICD-10-CM

## 2013-02-22 DIAGNOSIS — I5189 Other ill-defined heart diseases: Secondary | ICD-10-CM

## 2013-02-22 DIAGNOSIS — I4891 Unspecified atrial fibrillation: Secondary | ICD-10-CM

## 2013-02-22 DIAGNOSIS — I513 Intracardiac thrombosis, not elsewhere classified: Secondary | ICD-10-CM

## 2013-02-22 DIAGNOSIS — Z5181 Encounter for therapeutic drug level monitoring: Secondary | ICD-10-CM

## 2013-02-22 LAB — POCT INR: INR: 1.7

## 2013-03-22 ENCOUNTER — Other Ambulatory Visit: Payer: Self-pay | Admitting: Pharmacist Clinician (PhC)/ Clinical Pharmacy Specialist

## 2013-03-24 ENCOUNTER — Other Ambulatory Visit: Payer: Self-pay | Admitting: Pharmacist Clinician (PhC)/ Clinical Pharmacy Specialist

## 2013-03-24 ENCOUNTER — Ambulatory Visit (INDEPENDENT_AMBULATORY_CARE_PROVIDER_SITE_OTHER): Payer: Medicare Other | Admitting: Pharmacist Clinician (PhC)/ Clinical Pharmacy Specialist

## 2013-03-24 VITALS — BP 180/98 | HR 76

## 2013-03-24 DIAGNOSIS — Z7901 Long term (current) use of anticoagulants: Secondary | ICD-10-CM

## 2013-03-24 DIAGNOSIS — I4891 Unspecified atrial fibrillation: Secondary | ICD-10-CM

## 2013-03-24 DIAGNOSIS — I5189 Other ill-defined heart diseases: Secondary | ICD-10-CM

## 2013-03-24 DIAGNOSIS — I513 Intracardiac thrombosis, not elsewhere classified: Secondary | ICD-10-CM

## 2013-03-24 LAB — POCT INR: INR: 2.6

## 2013-03-24 MED ORDER — HYDRALAZINE HCL 50 MG PO TABS: 100.0000 mg | ORAL_TABLET | Freq: Three times a day (TID) | ORAL | Status: DC

## 2013-04-05 ENCOUNTER — Encounter: Payer: Self-pay | Admitting: Radiology

## 2013-04-05 ENCOUNTER — Ambulatory Visit (INDEPENDENT_AMBULATORY_CARE_PROVIDER_SITE_OTHER): Payer: Medicare Other | Admitting: Emergency Medicine

## 2013-04-05 VITALS — BP 182/100 | HR 73 | Temp 97.7°F | Resp 17 | Ht 70.0 in | Wt 176.0 lb

## 2013-04-05 DIAGNOSIS — S8010XA Contusion of unspecified lower leg, initial encounter: Secondary | ICD-10-CM

## 2013-04-05 LAB — PROTIME-INR
INR: 1.98 — AB (ref <1.50)
PROTHROMBIN TIME: 22.1 s — AB (ref 11.6–15.2)

## 2013-04-05 NOTE — Patient Instructions (Signed)
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
## 2013-04-06 ENCOUNTER — Ambulatory Visit (INDEPENDENT_AMBULATORY_CARE_PROVIDER_SITE_OTHER): Payer: Medicare Other | Admitting: Emergency Medicine

## 2013-04-06 VITALS — BP 208/102 | HR 73 | Temp 97.7°F | Resp 18 | Ht 69.0 in | Wt 171.0 lb

## 2013-04-06 DIAGNOSIS — I10 Essential (primary) hypertension: Secondary | ICD-10-CM

## 2013-04-06 DIAGNOSIS — S8010XA Contusion of unspecified lower leg, initial encounter: Secondary | ICD-10-CM

## 2013-04-06 NOTE — Progress Notes (Signed)
[facility] [address], [place] [address] [phone]  Date:  04/06/2013   Name:  Erik CarwinJoseph B Kellenberger   DOB:  [birth year]   MRN:  [id number]  PCP:  No PCP Per Patient    Chief Complaint: Follow-up   History of Present Illness:  Erik CarwinJoseph B Arnall is a 68 y.o. very pleasant male patient who presents with the following:  Patient seen yesterday and has kept his leg elevated with local application of heat.  Has moderate improvement in pain.  His blood pressure is noted to be very high again and he and the pharmacist at the cardiology practice both indicate there is no additional medication to bring his pressure under control.  No fever or chills.  No improvement with over the counter medications or other home remedies. Denies other complaint or health concern today.   Patient Active Problem List   Diagnosis Date Noted  . Left groin pain-chronic 05/07/2012  . Acute myocardial infarction, subendocardial infarction, initial episode of care 02/29/2012  . ARDS (adult respiratory distress syndrome) 02/27/2012  . Respiratory failure 02/27/2012  . Long term (current) use of anticoagulants 12/11/2011  . Thrombus of left atrial appendage 12/11/2011  . Atrial fibrillation 11/13/2011  . SOB (shortness of breath) 11/13/2011  . Chronic combined systolic and diastolic heart failure 11/16/2010  . S/P coronary artery stent placement 05/25/2010  . CAD (coronary artery disease)   . HTN (hypertension)   . Hypercholesteremia   . CKD (chronic kidney disease) stage 3, GFR 30-59 ml/min   . HYPERTENSION 11/26/2006    Past Medical History  Diagnosis Date  . CAD (coronary artery disease)     myoview 03/02/12-EF 32%, mod fixed defect in the inf wall without significant ischemia; a. s/p BMS to mLAD 2000;  b. LHC 3/12:  sig ISR mLAD, RI 80-90%, Dx1 90% (too small for PCI);  PCI:  ION DES to mLAD, ION DES to RI  . HTN (hypertension)   . Hypercholesteremia   . CVA (cerebral infarction)   .  CRI (chronic renal insufficiency)     Baseline creatinine of 1.4 to 1.7  . 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 Atrial fibrillation 11/13/2011    coumadin and amiodarone; s/p DCCV November 2013; repeat DCCV Feb 13, 2012  . Left atrial thrombus     TEE 11/2011  . Iron deficiency anemia   . Cardiomyopathy, ischemic     echo 02/28/12-Ef 25%, mod mr, mild-mod TR, mod pul htn; refuses ICD    Past Surgical History  Procedure Laterality Date  . Coronary angioplasty with stent placement  2000    BMS to the LAD  . Mastoidectomy    . Coronary angioplasty with stent placement  March 2012    DES to LAD and Ramus intermdius  . Tee without cardioversion  12/06/2011    Procedure: TRANSESOPHAGEAL ECHOCARDIOGRAM (TEE);  Surgeon: Pricilla RifflePaula V Ross, MD;  [facility];  Service: Cardiovascular;  Laterality: N/A;  . Cardioversion  01/06/2012    Procedure: CARDIOVERSION;  Surgeon: Wendall StadePeter C Nishan, MD;  [facility];  Service: Cardiovascular;  Laterality: N/A;  call anesthesia  maybe able to do cardio. early at 12:00  . Cardioversion  02/13/2012    Procedure: CARDIOVERSION;  Surgeon: Cassell Clementhomas Brackbill, MD;  [facility];  Service: Cardiovascular;  Laterality: N/A;  . Tonsillectomy    . Cholecystectomy  1999  . Left inguinal hernia repair  02/2007  . Hernia repair    .  Coronary angioplasty with stent placement  04/2000    DES to LAD and ramus intermediate vessel    History  Substance Use Topics  . Smoking status: Former Smoker    Types: Cigarettes    Quit date: 02/11/1974  . Smokeless tobacco: Not on file  . Alcohol Use: No    Family History  Problem Relation Age of Onset  . Heart disease Father     Allergies  Allergen Reactions  . Amlodipine Shortness Of Breath, Swelling and Palpitations    "Caused  Heart condition-per patient"  . Ace Inhibitors Other (See Comments)    Unknown reaction  . Clonidine Derivatives Other (See Comments)    Unknown  reaction    Medication list has been reviewed and updated.  Current Outpatient Prescriptions on File Prior to Visit  Medication Sig Dispense Refill  . amiodarone (PACERONE) 200 MG tablet Take 1 tablet (200 mg total) by mouth daily.  30 tablet  6  . aspirin 81 MG chewable tablet Chew 1 tablet (81 mg total) by mouth daily.      . carvedilol (COREG) 25 MG tablet Take 1 tablet (25 mg total) by mouth 2 (two) times daily with a meal.  60 tablet  6  . furosemide (LASIX) 40 MG tablet Take 3 tablets (120 mg total) by mouth every morning.  90 tablet  7  . hydrALAZINE (APRESOLINE) 50 MG tablet Take 2 tablets (100 mg total) by mouth 3 (three) times daily.  270 tablet  3  . isosorbide mononitrate (IMDUR) 60 MG 24 hr tablet Take 1 tablet (60 mg total) by mouth 2 (two) times daily.  60 tablet  5  . nitroGLYCERIN (NITROSTAT) 0.4 MG SL tablet Place 1 tablet (0.4 mg total) under the tongue every 5 (five) minutes x 3 doses as needed. For chest pain  25 tablet  6  . oxyCODONE-acetaminophen (PERCOCET) 5-325 MG per tablet Take 1 tablet by mouth every 4 (four) hours as needed for pain.  20 tablet  0  . potassium chloride SA (K-DUR,KLOR-CON) 20 MEQ tablet TAKE (2) TABLETS TWICE DAILY.  180 tablet  2  . predniSONE (DELTASONE) 20 MG tablet 3 tabs po day one, then 2 po daily x 4 days  11 tablet  0  . warfarin (COUMADIN) 5 MG tablet Take 1 tablet by mouth daily or as directed  90 tablet  1   No current facility-administered medications on file prior to visit.    Review of Systems:  As per HPI, otherwise negative.    Physical Examination: Filed Vitals:   04/06/13 1653  BP: 208/102  Pulse: 73  Temp: 97.7 F (36.5 C)  Resp: 18   Filed Vitals:   04/06/13 1653  Height: 5\' 9"  (1.753 m)  Weight: 171 lb (77.565 kg)   Body mass index is 25.24 kg/(m^2). Ideal Body Weight: Weight in (lb) to have BMI = 25: 168.9   GEN: WDWN, NAD, Non-toxic, Alert & Oriented x 3 HEENT: Atraumatic, Normocephalic.  Ears and  Nose: No external deformity. EXTR: No clubbing/cyanosis/edema NEURO: Normal gait.  PSYCH: Normally interactive. Conversant. Not depressed or anxious appearing.  Calm demeanor.  LEFT leg:  Marked improvement in swelling in interval.  Leg is soft and not tender.   Assessment and Plan: Ecchymosis leg Uncontrolled hypertension  Signed,  Phillips Odor, MD

## 2013-04-21 ENCOUNTER — Ambulatory Visit (INDEPENDENT_AMBULATORY_CARE_PROVIDER_SITE_OTHER): Payer: Medicare Other | Admitting: Pharmacist Clinician (PhC)/ Clinical Pharmacy Specialist

## 2013-04-21 DIAGNOSIS — Z7901 Long term (current) use of anticoagulants: Secondary | ICD-10-CM

## 2013-04-21 DIAGNOSIS — I513 Intracardiac thrombosis, not elsewhere classified: Secondary | ICD-10-CM

## 2013-04-21 DIAGNOSIS — I4891 Unspecified atrial fibrillation: Secondary | ICD-10-CM

## 2013-04-21 DIAGNOSIS — I5189 Other ill-defined heart diseases: Secondary | ICD-10-CM

## 2013-04-21 LAB — POCT INR: INR: 2.5

## 2013-05-17 ENCOUNTER — Other Ambulatory Visit: Payer: Self-pay | Admitting: Pharmacist Clinician (PhC)/ Clinical Pharmacy Specialist

## 2013-05-17 MED ORDER — AMIODARONE HCL 200 MG PO TABS: 200.0000 mg | ORAL_TABLET | Freq: Every day | ORAL | Status: DC

## 2013-05-17 NOTE — Telephone Encounter (Signed)
Rx refill sent to patients pharmacy  

## 2013-05-19 ENCOUNTER — Ambulatory Visit: Payer: Medicare Other | Admitting: Pharmacist Clinician (PhC)/ Clinical Pharmacy Specialist

## 2013-05-19 ENCOUNTER — Other Ambulatory Visit: Payer: Self-pay | Admitting: Pharmacist Clinician (PhC)/ Clinical Pharmacy Specialist

## 2013-05-19 MED ORDER — WARFARIN SODIUM 5 MG PO TABS: ORAL_TABLET | ORAL | Status: DC

## 2013-05-21 ENCOUNTER — Ambulatory Visit (INDEPENDENT_AMBULATORY_CARE_PROVIDER_SITE_OTHER): Payer: Medicare Other | Admitting: Pharmacist Clinician (PhC)/ Clinical Pharmacy Specialist

## 2013-05-21 VITALS — BP 162/80 | HR 72

## 2013-05-21 DIAGNOSIS — Z7901 Long term (current) use of anticoagulants: Secondary | ICD-10-CM

## 2013-05-21 DIAGNOSIS — I4891 Unspecified atrial fibrillation: Secondary | ICD-10-CM

## 2013-05-21 DIAGNOSIS — I513 Intracardiac thrombosis, not elsewhere classified: Secondary | ICD-10-CM

## 2013-05-21 DIAGNOSIS — I5189 Other ill-defined heart diseases: Secondary | ICD-10-CM

## 2013-05-21 LAB — POCT INR: INR: 2.6

## 2013-06-07 ENCOUNTER — Other Ambulatory Visit: Payer: Self-pay | Admitting: *Deleted

## 2013-06-07 MED ORDER — POTASSIUM CHLORIDE CRYS ER 20 MEQ PO TBCR: EXTENDED_RELEASE_TABLET | ORAL | Status: DC

## 2013-06-07 NOTE — Telephone Encounter (Signed)
Refilled patient's potassium ER 20 mEq  # 120 with 2 refills with message saying appointment needed.

## 2013-06-18 ENCOUNTER — Ambulatory Visit (INDEPENDENT_AMBULATORY_CARE_PROVIDER_SITE_OTHER): Payer: Medicare Other | Admitting: Pharmacist Clinician (PhC)/ Clinical Pharmacy Specialist

## 2013-06-18 DIAGNOSIS — I4891 Unspecified atrial fibrillation: Secondary | ICD-10-CM

## 2013-06-18 DIAGNOSIS — Z7901 Long term (current) use of anticoagulants: Secondary | ICD-10-CM

## 2013-06-18 DIAGNOSIS — I513 Intracardiac thrombosis, not elsewhere classified: Secondary | ICD-10-CM

## 2013-06-18 DIAGNOSIS — I5189 Other ill-defined heart diseases: Secondary | ICD-10-CM

## 2013-06-18 LAB — POCT INR: INR: 2.7

## 2013-07-06 ENCOUNTER — Encounter (INDEPENDENT_AMBULATORY_CARE_PROVIDER_SITE_OTHER): Payer: Medicare Other | Admitting: Ophthalmology

## 2013-07-09 ENCOUNTER — Encounter (INDEPENDENT_AMBULATORY_CARE_PROVIDER_SITE_OTHER): Payer: Medicare Other | Admitting: Ophthalmology

## 2013-07-09 DIAGNOSIS — H2510 Age-related nuclear cataract, unspecified eye: Secondary | ICD-10-CM

## 2013-07-09 DIAGNOSIS — I10 Essential (primary) hypertension: Secondary | ICD-10-CM

## 2013-07-09 DIAGNOSIS — H348192 Central retinal vein occlusion, unspecified eye, stable: Secondary | ICD-10-CM

## 2013-07-09 DIAGNOSIS — H43819 Vitreous degeneration, unspecified eye: Secondary | ICD-10-CM

## 2013-07-09 DIAGNOSIS — H35039 Hypertensive retinopathy, unspecified eye: Secondary | ICD-10-CM

## 2013-07-14 ENCOUNTER — Ambulatory Visit (INDEPENDENT_AMBULATORY_CARE_PROVIDER_SITE_OTHER): Payer: Medicare Other | Admitting: Pharmacist Clinician (PhC)/ Clinical Pharmacy Specialist

## 2013-07-14 DIAGNOSIS — Z7901 Long term (current) use of anticoagulants: Secondary | ICD-10-CM

## 2013-07-14 DIAGNOSIS — I5189 Other ill-defined heart diseases: Secondary | ICD-10-CM

## 2013-07-14 DIAGNOSIS — I4891 Unspecified atrial fibrillation: Secondary | ICD-10-CM

## 2013-07-14 DIAGNOSIS — I513 Intracardiac thrombosis, not elsewhere classified: Secondary | ICD-10-CM

## 2013-07-14 LAB — POCT INR: INR: 2.7

## 2013-07-15 ENCOUNTER — Telehealth: Payer: Self-pay | Admitting: Cardiovascular Disease

## 2013-07-15 NOTE — Telephone Encounter (Signed)
LM for Becky - explained pt was insistent that I call to give approval for cataract surgery due to warfarin, but they have apparently already received all surgical clearance info needed.

## 2013-07-15 NOTE — Telephone Encounter (Signed)
Erik Adams states that she received a call from you yesterday in reference to a surgical clearance letter that was received and you stated that you needed additional information.  She needs to know what additional information you need--you can leave a voice mail on her extention.

## 2013-07-19 ENCOUNTER — Telehealth: Payer: Self-pay | Admitting: *Deleted

## 2013-07-19 NOTE — Telephone Encounter (Signed)
Faxed clearance ok to have cateract surgery scheduled 07-27-13 to be done by Dr. Arcelia Jew.

## 2013-08-09 ENCOUNTER — Encounter (INDEPENDENT_AMBULATORY_CARE_PROVIDER_SITE_OTHER): Payer: Medicare Other | Admitting: Ophthalmology

## 2013-08-09 ENCOUNTER — Other Ambulatory Visit: Payer: Self-pay | Admitting: *Deleted

## 2013-08-09 DIAGNOSIS — H35039 Hypertensive retinopathy, unspecified eye: Secondary | ICD-10-CM

## 2013-08-09 DIAGNOSIS — H43819 Vitreous degeneration, unspecified eye: Secondary | ICD-10-CM

## 2013-08-09 DIAGNOSIS — H348192 Central retinal vein occlusion, unspecified eye, stable: Secondary | ICD-10-CM

## 2013-08-09 DIAGNOSIS — I10 Essential (primary) hypertension: Secondary | ICD-10-CM

## 2013-08-09 MED ORDER — ISOSORBIDE MONONITRATE ER 60 MG PO TB24: 60.0000 mg | ORAL_TABLET | Freq: Two times a day (BID) | ORAL | Status: DC

## 2013-08-09 NOTE — Telephone Encounter (Signed)
Rx refill sent to patient pharmacy   

## 2013-08-11 ENCOUNTER — Ambulatory Visit (INDEPENDENT_AMBULATORY_CARE_PROVIDER_SITE_OTHER): Payer: Medicare Other | Admitting: Pharmacist Clinician (PhC)/ Clinical Pharmacy Specialist

## 2013-08-11 VITALS — BP 200/102

## 2013-08-11 DIAGNOSIS — Z7901 Long term (current) use of anticoagulants: Secondary | ICD-10-CM

## 2013-08-11 DIAGNOSIS — I4891 Unspecified atrial fibrillation: Secondary | ICD-10-CM

## 2013-08-11 DIAGNOSIS — I5189 Other ill-defined heart diseases: Secondary | ICD-10-CM

## 2013-08-11 DIAGNOSIS — I513 Intracardiac thrombosis, not elsewhere classified: Secondary | ICD-10-CM

## 2013-08-11 LAB — POCT INR: INR: 3.1

## 2013-08-17 ENCOUNTER — Other Ambulatory Visit: Payer: Self-pay | Admitting: Cardiology

## 2013-09-08 ENCOUNTER — Ambulatory Visit (INDEPENDENT_AMBULATORY_CARE_PROVIDER_SITE_OTHER): Payer: Medicare Other | Admitting: Pharmacist Clinician (PhC)/ Clinical Pharmacy Specialist

## 2013-09-08 DIAGNOSIS — I5189 Other ill-defined heart diseases: Secondary | ICD-10-CM

## 2013-09-08 DIAGNOSIS — Z7901 Long term (current) use of anticoagulants: Secondary | ICD-10-CM

## 2013-09-08 DIAGNOSIS — I4891 Unspecified atrial fibrillation: Secondary | ICD-10-CM

## 2013-09-08 DIAGNOSIS — I513 Intracardiac thrombosis, not elsewhere classified: Secondary | ICD-10-CM

## 2013-09-08 LAB — POCT INR: INR: 2.4

## 2013-09-09 ENCOUNTER — Other Ambulatory Visit: Payer: Self-pay | Admitting: *Deleted

## 2013-09-09 MED ORDER — AMIODARONE HCL 200 MG PO TABS: 200.0000 mg | ORAL_TABLET | Freq: Every day | ORAL | Status: DC

## 2013-09-09 NOTE — Telephone Encounter (Signed)
Rx was sent to pharmacy electronically. 

## 2013-09-13 ENCOUNTER — Encounter (INDEPENDENT_AMBULATORY_CARE_PROVIDER_SITE_OTHER): Payer: Medicare Other | Admitting: Ophthalmology

## 2013-09-13 DIAGNOSIS — H348192 Central retinal vein occlusion, unspecified eye, stable: Secondary | ICD-10-CM

## 2013-09-13 DIAGNOSIS — H43819 Vitreous degeneration, unspecified eye: Secondary | ICD-10-CM

## 2013-09-13 DIAGNOSIS — H2510 Age-related nuclear cataract, unspecified eye: Secondary | ICD-10-CM

## 2013-09-13 DIAGNOSIS — I10 Essential (primary) hypertension: Secondary | ICD-10-CM

## 2013-09-13 DIAGNOSIS — H35039 Hypertensive retinopathy, unspecified eye: Secondary | ICD-10-CM

## 2013-10-05 ENCOUNTER — Other Ambulatory Visit: Payer: Self-pay | Admitting: Cardiovascular Disease

## 2013-10-05 NOTE — Telephone Encounter (Signed)
Rx was sent to pharmacy electronically. 

## 2013-10-06 ENCOUNTER — Ambulatory Visit (INDEPENDENT_AMBULATORY_CARE_PROVIDER_SITE_OTHER): Payer: Medicare Other | Admitting: Pharmacist Clinician (PhC)/ Clinical Pharmacy Specialist

## 2013-10-06 DIAGNOSIS — I4891 Unspecified atrial fibrillation: Secondary | ICD-10-CM

## 2013-10-06 DIAGNOSIS — I5189 Other ill-defined heart diseases: Secondary | ICD-10-CM

## 2013-10-06 DIAGNOSIS — Z7901 Long term (current) use of anticoagulants: Secondary | ICD-10-CM

## 2013-10-06 DIAGNOSIS — I513 Intracardiac thrombosis, not elsewhere classified: Secondary | ICD-10-CM

## 2013-10-06 LAB — POCT INR: INR: 2.5

## 2013-10-12 ENCOUNTER — Ambulatory Visit: Payer: Medicare Other

## 2013-10-21 ENCOUNTER — Other Ambulatory Visit: Payer: Self-pay | Admitting: Cardiovascular Disease

## 2013-10-22 NOTE — Telephone Encounter (Signed)
Rx refill sent to patient pharmacy   

## 2013-10-23 ENCOUNTER — Other Ambulatory Visit: Payer: Self-pay | Admitting: Cardiovascular Disease

## 2013-11-11 ENCOUNTER — Ambulatory Visit (INDEPENDENT_AMBULATORY_CARE_PROVIDER_SITE_OTHER): Payer: Medicare Other | Admitting: *Deleted

## 2013-11-11 ENCOUNTER — Ambulatory Visit (INDEPENDENT_AMBULATORY_CARE_PROVIDER_SITE_OTHER): Payer: Medicare Other | Admitting: Cardiovascular Disease

## 2013-11-11 VITALS — BP 192/102 | HR 66 | Ht 70.0 in | Wt 166.7 lb

## 2013-11-11 DIAGNOSIS — I10 Essential (primary) hypertension: Secondary | ICD-10-CM

## 2013-11-11 DIAGNOSIS — E785 Hyperlipidemia, unspecified: Secondary | ICD-10-CM

## 2013-11-11 DIAGNOSIS — I513 Intracardiac thrombosis, not elsewhere classified: Secondary | ICD-10-CM

## 2013-11-11 DIAGNOSIS — R5383 Other fatigue: Secondary | ICD-10-CM

## 2013-11-11 DIAGNOSIS — Z7901 Long term (current) use of anticoagulants: Secondary | ICD-10-CM

## 2013-11-11 DIAGNOSIS — I5189 Other ill-defined heart diseases: Secondary | ICD-10-CM

## 2013-11-11 DIAGNOSIS — N183 Chronic kidney disease, stage 3 unspecified: Secondary | ICD-10-CM

## 2013-11-11 DIAGNOSIS — I209 Angina pectoris, unspecified: Secondary | ICD-10-CM

## 2013-11-11 DIAGNOSIS — I4891 Unspecified atrial fibrillation: Secondary | ICD-10-CM

## 2013-11-11 DIAGNOSIS — I25709 Atherosclerosis of coronary artery bypass graft(s), unspecified, with unspecified angina pectoris: Secondary | ICD-10-CM

## 2013-11-11 DIAGNOSIS — I255 Ischemic cardiomyopathy: Secondary | ICD-10-CM

## 2013-11-11 DIAGNOSIS — I5042 Chronic combined systolic (congestive) and diastolic (congestive) heart failure: Secondary | ICD-10-CM

## 2013-11-11 DIAGNOSIS — I429 Cardiomyopathy, unspecified: Secondary | ICD-10-CM

## 2013-11-11 LAB — POCT INR: INR: 3

## 2013-11-11 MED ORDER — HYDRALAZINE HCL 25 MG PO TABS: ORAL_TABLET | ORAL | Status: DC

## 2013-11-11 NOTE — Patient Instructions (Addendum)
Your physician has requested that you have an echocardiogram. Echocardiography is a painless test that uses sound waves to create images of your heart. It provides your doctor with information about the size and shape of your heart and how well your heart's chambers and valves are working. This procedure takes approximately one hour. There are no restrictions for this procedure.  Your physician recommends that you return for lab work fasting.  Your physician has recommended you make the following change in your medication: increase the hydralazine to 75 mg three times a day ( 25mg  tablets 3 tablets three times a day)  Your physician recommends that you schedule a follow-up appointment in: 3 months with Dr. Tresa Endo.

## 2013-11-13 ENCOUNTER — Encounter: Payer: Self-pay | Admitting: Cardiovascular Disease

## 2013-11-13 DIAGNOSIS — I255 Ischemic cardiomyopathy: Secondary | ICD-10-CM | POA: Insufficient documentation

## 2013-11-13 DIAGNOSIS — I10 Essential (primary) hypertension: Secondary | ICD-10-CM | POA: Insufficient documentation

## 2013-11-13 NOTE — Progress Notes (Signed)
Patient ID: Erik Adams, male   DOB: [birth year], 68 y.o.   MRN: [id number]      HPI: Erik Adams, is a 68 y.o. male who presents to the office today for a 16 month followup of his ischemic cardiomyopathy.  Mrs. Vold is a former patient of Dr. Peter [place]. In 2000 he underwent bare-metal stenting to his LAD and in March 2012 underwent DES stenting to his LAD and ramus intermediate vessel. 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 In January 2014 he was hospitalized with increasing shortness of breath and was profoundly hypoxic, febrile and required high level support. Ejection fraction on Myoview imaging was 32% an echo Doppler assessment was 25% with restrictive physiology. I initially saw him in March 2014 to establish care with me I try to supervise his medications. A that time, I attempted to start bidil rather than the hydralazine that he was taking most likely incorrect. At T\that time he was also intermittently only taking isosorbide dinitrate.  I saw him in followup on 06/04/2012 tried again to further consolidate his medications. At that time I further titrate his Bidil to 2 tablets twice a day for 2 weeks and then recommended he increase this to 2 tablets 3 times a day.  I also discusses potential need for potential future defibrillator insertion but at the time he was pretty adamant against having this.  I last saw him a year ago.  He had had confusion with his medications.  He did see Penni Bombard.D. in followup to assist with his medication compliance.  Most recently, he has been taking amiodarone 200 mg daily, carvedilol 25 mg twice a day.  I believe he is taking Lasix 120 mg every morning.  He has hydralazine 25 mg pills and has been taking 3 tablets twice a day, as well as isosorbide  mononitrate 60 mg twice a day.  He is maintained on Coumadin anticoagulation.  He denies breakthrough bleeding.  He complains of being unsteady on his feet.  He denies any frank orthostasis.  There is no presyncope or syncope.  He is unaware of any arrhythmia.  He presents for evaluation.  Past Medical History  Diagnosis Date  . CAD (coronary artery disease)     myoview 03/02/12-EF 32%, mod fixed defect in the inf wall without significant ischemia; a. s/p BMS to mLAD 2000;  b. LHC 3/12:  sig ISR mLAD, RI 80-90%, Dx1 90% (too small for PCI);  PCI:  ION DES to mLAD, ION DES to RI  . HTN (hypertension)   . Hypercholesteremia   . CVA (cerebral infarction)   . CRI (chronic renal insufficiency)     Baseline creatinine of 1.4 to 1.7  . 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 Atrial fibrillation 11/13/2011    coumadin and amiodarone; s/p DCCV November 2013; repeat DCCV Feb 13, 2012  . Left atrial thrombus     TEE 11/2011  . Iron deficiency anemia   . Cardiomyopathy, ischemic     echo 02/28/12-Ef 25%, mod mr, mild-mod TR, mod pul htn; refuses ICD    Past Surgical History  Procedure Laterality Date  . Coronary angioplasty with stent placement  2000    BMS to the LAD  . Mastoidectomy    . Coronary angioplasty with stent placement  March 2012  DES to LAD and Ramus intermdius  . Tee without cardioversion  12/06/2011    Procedure: TRANSESOPHAGEAL ECHOCARDIOGRAM (TEE);  Surgeon: Pricilla RifflePaula V Ross, MD;  [facility];  Service: Cardiovascular;  Laterality: N/A;  . Cardioversion  01/06/2012    Procedure: CARDIOVERSION;  Surgeon: Wendall StadePeter C Nishan, MD;  [facility];  Service: Cardiovascular;  Laterality: N/A;  call anesthesia  maybe able to do cardio. early at 12:00  . Cardioversion  02/13/2012    Procedure: CARDIOVERSION;  Surgeon: Cassell Clementhomas Brackbill, MD;  [facility];  Service: Cardiovascular;  Laterality: N/A;  . Tonsillectomy    . Cholecystectomy  1999  .  Left inguinal hernia repair  02/2007  . Hernia repair    . Coronary angioplasty with stent placement  04/2000    DES to LAD and ramus intermediate vessel    Allergies  Allergen Reactions  . Amlodipine Shortness Of Breath, Swelling and Palpitations    "Caused  Heart condition-per patient"  . Ace Inhibitors Other (See Comments)    Unknown reaction  . Clonidine Derivatives Other (See Comments)    Unknown reaction    Current Outpatient Prescriptions  Medication Sig Dispense Refill  . amiodarone (PACERONE) 200 MG tablet Take 1 tablet (200 mg total) by mouth daily.  30 tablet  2  . aspirin 81 MG chewable tablet Chew 1 tablet (81 mg total) by mouth daily.      . carvedilol (COREG) 25 MG tablet TAKE 1 TABLET TWICE DAILY WITH FOOD.  120 tablet  2  . furosemide (LASIX) 40 MG tablet TAKE 3 TABLETS EVERY AM.  90 tablet  6  . isosorbide mononitrate (IMDUR) 60 MG 24 hr tablet Take 1 tablet (60 mg total) by mouth 2 (two) times daily. MUST KEEP APPOINTMENT 11/11/2013 FOR FUTURE REFILLS.  60 tablet  1  . nitroGLYCERIN (NITROSTAT) 0.4 MG SL tablet Place 1 tablet (0.4 mg total) under the tongue every 5 (five) minutes x 3 doses as needed. For chest pain  25 tablet  6  . oxyCODONE-acetaminophen (PERCOCET) 5-325 MG per tablet Take 1 tablet by mouth every 4 (four) hours as needed for pain.  20 tablet  0  . potassium chloride SA (K-DUR,KLOR-CON) 20 MEQ tablet TAKE (2) TABLETS TWICE DAILY.  180 tablet  2  . predniSONE (DELTASONE) 20 MG tablet 3 tabs po day one, then 2 po daily x 4 days  11 tablet  0  . warfarin (COUMADIN) 5 MG tablet Take 1 tablet by mouth daily or as directed  90 tablet  1  . hydrALAZINE (APRESOLINE) 25 MG tablet Take 3 tablets three times a day  270 tablet  6   No current facility-administered medications for this visit.    Socially he is widowed has 5 children 9 grandchildren no great-grandchildren. His wife died of brain cancer. 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 He previously worked in  [employer] education [employer]. His 5 brothers 2 sisters and 5 children.   ROS General: Negative; No fevers, chills, or night sweats; weakness on his feet HEENT: Negative; No changes in vision or hearing, sinus congestion, difficulty swallowing Pulmonary: Negative; No cough, wheezing, shortness of breath, hemoptysis Cardiovascular: Negative; No chest pain, presyncope, syncope, palpitations GI: Negative; No nausea, vomiting, diarrhea, or abdominal pain GU: Negative; No dysuria, hematuria, or difficulty voiding Musculoskeletal: Negative; no myalgias, joint pain, or weakness Hematologic/Oncology: Negative; no easy bruising, bleeding Endocrine: Negative; no heat/cold intolerance; no diabetes Neuro: Negative; no changes in balance, headaches Skin: Negative; No rashes or skin  lesions Psychiatric: Negative; No behavioral problems, depression Sleep: Negative; No snoring, daytime sleepiness, hypersomnolence, bruxism, restless legs, hypnogognic hallucinations, no cataplexy Other comprehensive 14 point system review is negative.  PE BP 192/102  Pulse 66  Ht 5\' 10"  (1.778 m)  Wt 166 lb 11.2 oz (75.615 kg)  BMI 23.92 kg/m2  Repeat blood pressure by me was 180/94 supine, over 90 standing without significant orthostatic pulse rise. General: Alert, oriented, no distress.  Skin: normal turgor, no rashes HEENT: Normocephalic, atraumatic. Pupils round and reactive; sclera anicteric;no lid lag.  Nose without nasal septal hypertrophy Mouth/Parynx benign; Mallinpatti scale 3  Neck: No JVD, no carotid bruits with normal carotid upstroke Lungs: clear to ausculatation and percussion; no wheezing or rales Chest wall: Nontender to palpation Heart: RRR, s1 s2 normal 1/6 systolic murmur.  No diastolic murmur.  No rubs, thrills or heaves; No S3 gallop. Abdomen: soft, nontender; no hepatosplenomehaly, BS+; abdominal aorta nontender and not dilated by palpation. Back: No CVA tenderness Pulses 2+ Extremities: no clubbing cyanosis  or edema, Homan's sign negative  Neurologic: grossly nonfocal Psychological: Normal affect   ECG (independently read by me): Normal sinus rhythm.  Borderline first-degree AV block with PR interval 204 ms.  LVH with repolarization changes.  ECG normal sinus rhythm with first-degree AV block the there is evidence for LVH with QRS widening and repolarization changes. PR interval 210 ms QTc interval 499 ms.  LABS:  BMET    Component Value Date/Time   NA 135 09/23/2012 0815   K 4.7 09/23/2012 0815   CL 100 09/23/2012 0815   CO2 27 09/23/2012 0815   GLUCOSE 115* 09/23/2012 0815   BUN 37* 09/23/2012 0815   CREATININE 1.69* 09/23/2012 0815   CREATININE 1.81* 08/27/2012 1441   CALCIUM 9.0 09/23/2012 0815   GFRNONAA 40* 09/23/2012 0815   GFRNONAA 32* 08/11/2012 1625   GFRAA 47* 09/23/2012 0815   GFRAA 37* 08/11/2012 1625     Hepatic Function Panel     Component Value Date/Time   PROT 6.6 02/27/2012 1345   ALBUMIN 3.4* 02/27/2012 1345   AST 36 02/27/2012 1345   ALT 33 02/27/2012 1345   ALKPHOS 93 02/27/2012 1345   BILITOT 1.0 02/27/2012 1345   BILIDIR 0.2 11/13/2011 1617     CBC    Component Value Date/Time   WBC 11.4* 09/23/2012 0815   RBC 3.97* 09/23/2012 0815   RBC 4.30 12/06/2011 0253   HGB 10.8* 09/23/2012 0815   HCT 34.2* 09/23/2012 0815   PLT 169 09/23/2012 0815   MCV 86.1 09/23/2012 0815   MCH 27.2 09/23/2012 0815   MCHC 31.6 09/23/2012 0815   RDW 17.1* 09/23/2012 0815   LYMPHSABS 1.0 09/23/2012 0815   MONOABS 1.1* 09/23/2012 0815   EOSABS 0.1 09/23/2012 0815   BASOSABS 0.0 09/23/2012 0815     BNP    Component Value Date/Time   PROBNP 1711.0* 03/18/2012 1641    Lipid Panel     Component Value Date/Time   CHOL 98 11/13/2011 1617   TRIG 43.0 11/13/2011 1617   HDL 34.50* 11/13/2011 1617   CHOLHDL 3 11/13/2011 1617   VLDL 8.6 11/13/2011 1617   LDLCALC 55 11/13/2011 1617     RADIOLOGY: No results found.    ASSESSMENT AND PLAN:   Mr. Muzik is a 68 year old gentleman with an  ischemic cardiomyopathy who is status post stenting of his LAD and ramus intermediate vessel. Has a history of atrial fibrillation and remote documented LAD thrombus for, which she's been  on warfarin anticoagulation, renal insufficiency, hypertension, hyperlipidemia remote CVA. He has history restrictive physiology with an EF of 25% on echo Doppler assessment and has a moderate fixed defect in inferior wall without ischemia on Myoview imaging.  I had a long discussion with him concerning his medication since in the past.  He has had significant confusion relative to this.  Presently, I am recommending further titration of his hydralazine to 75 mg 3 times a day.  He continues to be hypertensive.  I'm also reassessing his LV function on echo Doppler study.  A complete set of laboratory will be obtained and adjustments will be made if necessary to his regimen.  I will see him in the office in 2-3 months for reevaluation or sooner if problems arise.    Lennette Bihari, MD, [facility]  11/13/2013 9:44 AM

## 2013-11-15 ENCOUNTER — Encounter (INDEPENDENT_AMBULATORY_CARE_PROVIDER_SITE_OTHER): Payer: Medicare Other | Admitting: Ophthalmology

## 2013-11-16 ENCOUNTER — Telehealth (HOSPITAL_COMMUNITY): Payer: Self-pay | Admitting: *Deleted

## 2013-11-17 ENCOUNTER — Encounter (INDEPENDENT_AMBULATORY_CARE_PROVIDER_SITE_OTHER): Payer: Medicare Other | Admitting: Ophthalmology

## 2013-11-18 ENCOUNTER — Encounter (INDEPENDENT_AMBULATORY_CARE_PROVIDER_SITE_OTHER): Payer: Medicare Other | Admitting: Ophthalmology

## 2013-11-18 DIAGNOSIS — H34812 Central retinal vein occlusion, left eye: Secondary | ICD-10-CM

## 2013-11-18 DIAGNOSIS — H43813 Vitreous degeneration, bilateral: Secondary | ICD-10-CM

## 2013-11-18 DIAGNOSIS — H2512 Age-related nuclear cataract, left eye: Secondary | ICD-10-CM

## 2013-11-18 DIAGNOSIS — H35033 Hypertensive retinopathy, bilateral: Secondary | ICD-10-CM

## 2013-11-18 DIAGNOSIS — I10 Essential (primary) hypertension: Secondary | ICD-10-CM

## 2013-11-18 DIAGNOSIS — H34831 Tributary (branch) retinal vein occlusion, right eye: Secondary | ICD-10-CM

## 2013-11-23 ENCOUNTER — Other Ambulatory Visit: Payer: Self-pay | Admitting: *Deleted

## 2013-11-23 ENCOUNTER — Telehealth (HOSPITAL_COMMUNITY): Payer: Self-pay | Admitting: *Deleted

## 2013-11-23 MED ORDER — HYDRALAZINE HCL 25 MG PO TABS: ORAL_TABLET | ORAL | Status: DC

## 2013-11-23 NOTE — Telephone Encounter (Signed)
Medication script sent electronically

## 2013-11-25 ENCOUNTER — Encounter (HOSPITAL_COMMUNITY): Payer: Self-pay | Admitting: *Deleted

## 2013-11-29 ENCOUNTER — Telehealth: Payer: Self-pay | Admitting: *Deleted

## 2013-11-29 NOTE — Telephone Encounter (Signed)
Clarification provided that the hydralazine was increased to 75 mg three times a day

## 2013-11-30 ENCOUNTER — Other Ambulatory Visit: Payer: Self-pay | Admitting: Cardiovascular Disease

## 2013-11-30 ENCOUNTER — Telehealth: Payer: Self-pay | Admitting: Cardiovascular Disease

## 2013-11-30 NOTE — Telephone Encounter (Signed)
Rx was sent to pharmacy electronically. 

## 2013-11-30 NOTE — Telephone Encounter (Signed)
The dose is hydralazine 75 mg every 8 hrs.

## 2013-11-30 NOTE — Telephone Encounter (Signed)
Please call-concerning his Hydralazine dosage.

## 2013-11-30 NOTE — Telephone Encounter (Signed)
SPOKE TO KIM (PHARMACIST) SHE IS ALSO THE PATIENT'S DAUGHTER. SHE STATES PATIENT HAD BEEN TAKING 100 mg (2 x 50 mg tablets) of hydralazine three time a day prior to to last office visit 11/11/13. She states he is now taking 150 mg three times a day. She states that he is not tolerating medication - he sleeps all day.  She states prescription from 11/11/13 was for 75 MG  (3 x 25 MG tablet) three times a day. She wanted to know if Dr Tresa Endo was aware of patient previous dosage. She states her father gets confused with his medication.  She states she will come with him at next appointment. She is aware that RN will defer to  Dr Tresa Endo concerning medications

## 2013-11-30 NOTE — Telephone Encounter (Signed)
INFORMED KIM  OF DR Landry Dyke DISCUSSION.  SHE VERBALIZED UNDERSTANDING. SHE STATES SHE WILL CONTACT PATIENT.

## 2013-12-06 ENCOUNTER — Ambulatory Visit (HOSPITAL_COMMUNITY)
Admission: RE | Admit: 2013-12-06 | Discharge: 2013-12-06 | Disposition: A | Discharge status: Home / Self Care | Payer: Medicare Other | Source: Ambulatory Visit | Attending: Cardiovascular Disease | Admitting: Cardiovascular Disease

## 2013-12-06 DIAGNOSIS — I428 Other cardiomyopathies: Secondary | ICD-10-CM | POA: Insufficient documentation

## 2013-12-06 DIAGNOSIS — I429 Cardiomyopathy, unspecified: Secondary | ICD-10-CM

## 2013-12-06 DIAGNOSIS — I10 Essential (primary) hypertension: Secondary | ICD-10-CM | POA: Diagnosis not present

## 2013-12-06 DIAGNOSIS — I359 Nonrheumatic aortic valve disorder, unspecified: Secondary | ICD-10-CM

## 2013-12-06 DIAGNOSIS — E785 Hyperlipidemia, unspecified: Secondary | ICD-10-CM | POA: Insufficient documentation

## 2013-12-06 NOTE — Progress Notes (Signed)
2D Echo Performed 12/06/2013    Briley Bumgarner, RCS  

## 2013-12-07 ENCOUNTER — Other Ambulatory Visit: Payer: Self-pay | Admitting: Cardiovascular Disease

## 2013-12-07 NOTE — Telephone Encounter (Signed)
Rx was sent to pharmacy electronically. 

## 2013-12-27 ENCOUNTER — Other Ambulatory Visit: Payer: Self-pay | Admitting: Dermatology

## 2014-01-04 ENCOUNTER — Encounter: Payer: Self-pay | Admitting: Cardiovascular Disease

## 2014-01-04 ENCOUNTER — Ambulatory Visit (INDEPENDENT_AMBULATORY_CARE_PROVIDER_SITE_OTHER): Payer: Medicare Other | Admitting: *Deleted

## 2014-01-04 ENCOUNTER — Ambulatory Visit (INDEPENDENT_AMBULATORY_CARE_PROVIDER_SITE_OTHER): Payer: Medicare Other | Admitting: Cardiovascular Disease

## 2014-01-04 VITALS — BP 168/98 | HR 66 | Ht 70.0 in | Wt 160.7 lb

## 2014-01-04 DIAGNOSIS — I10 Essential (primary) hypertension: Secondary | ICD-10-CM

## 2014-01-04 DIAGNOSIS — I255 Ischemic cardiomyopathy: Secondary | ICD-10-CM

## 2014-01-04 DIAGNOSIS — I4891 Unspecified atrial fibrillation: Secondary | ICD-10-CM

## 2014-01-04 DIAGNOSIS — Z7901 Long term (current) use of anticoagulants: Secondary | ICD-10-CM

## 2014-01-04 DIAGNOSIS — E7800 Pure hypercholesterolemia, unspecified: Secondary | ICD-10-CM

## 2014-01-04 DIAGNOSIS — N183 Chronic kidney disease, stage 3 unspecified: Secondary | ICD-10-CM

## 2014-01-04 DIAGNOSIS — I5189 Other ill-defined heart diseases: Secondary | ICD-10-CM

## 2014-01-04 DIAGNOSIS — I513 Intracardiac thrombosis, not elsewhere classified: Secondary | ICD-10-CM

## 2014-01-04 DIAGNOSIS — Z79899 Other long term (current) drug therapy: Secondary | ICD-10-CM

## 2014-01-04 DIAGNOSIS — I5042 Chronic combined systolic (congestive) and diastolic (congestive) heart failure: Secondary | ICD-10-CM

## 2014-01-04 DIAGNOSIS — I2510 Atherosclerotic heart disease of native coronary artery without angina pectoris: Secondary | ICD-10-CM

## 2014-01-04 LAB — COMPREHENSIVE METABOLIC PANEL
ALBUMIN: 4.1 g/dL (ref 3.5–5.2)
ALK PHOS: 72 U/L (ref 39–117)
ALT: 19 U/L (ref 0–53)
AST: 24 U/L (ref 0–37)
BUN: 49 mg/dL — AB (ref 6–23)
CO2: 27 mEq/L (ref 19–32)
CREATININE: 2.38 mg/dL — AB (ref 0.50–1.35)
Calcium: 9 mg/dL (ref 8.4–10.5)
Chloride: 101 mEq/L (ref 96–112)
Glucose, Bld: 95 mg/dL (ref 70–99)
POTASSIUM: 4.9 meq/L (ref 3.5–5.3)
Sodium: 139 mEq/L (ref 135–145)
Total Bilirubin: 0.7 mg/dL (ref 0.2–1.2)
Total Protein: 6.4 g/dL (ref 6.0–8.3)

## 2014-01-04 LAB — CBC
HEMATOCRIT: 38.7 % — AB (ref 39.0–52.0)
Hemoglobin: 13.1 g/dL (ref 13.0–17.0)
MCH: 30.8 pg (ref 26.0–34.0)
MCHC: 33.9 g/dL (ref 30.0–36.0)
MCV: 91.1 fL (ref 78.0–100.0)
MPV: 11.1 fL (ref 9.4–12.4)
Platelets: 201 10*3/uL (ref 150–400)
RBC: 4.25 MIL/uL (ref 4.22–5.81)
RDW: 13.4 % (ref 11.5–15.5)
WBC: 7.1 10*3/uL (ref 4.0–10.5)

## 2014-01-04 LAB — POCT INR: INR: 2

## 2014-01-04 LAB — LIPID PANEL
CHOL/HDL RATIO: 5.3 ratio
Cholesterol: 206 mg/dL — ABNORMAL HIGH (ref 0–200)
HDL: 39 mg/dL — ABNORMAL LOW (ref >39)
LDL Cholesterol: 139 mg/dL — ABNORMAL HIGH (ref 0–99)
TRIGLYCERIDES: 142 mg/dL (ref <150)
VLDL: 28 mg/dL (ref 0–40)

## 2014-01-04 LAB — TSH: TSH: 5.368 u[IU]/mL — ABNORMAL HIGH (ref 0.350–4.500)

## 2014-01-04 MED ORDER — DOXAZOSIN MESYLATE 1 MG PO TABS: 1.0000 mg | ORAL_TABLET | Freq: Every day | ORAL | Status: AC

## 2014-01-04 NOTE — Patient Instructions (Addendum)
Your physician has recommended you make the following change in your medication: start new prescription for cardura 1 mg. This has already been sent to your pharmacy.  Your physician recommends that you return for lab work fasting.  Your physician recommends that you schedule a follow-up appointment in: 3 months.  Your physician recommends that you schedule a follow-up appointment in: Stay on the same dose of Coumadin(Warfarin) and follow-up with Baxter Hire the first week of january

## 2014-01-04 NOTE — Progress Notes (Signed)
Patient ID: Erik Adams, male   DOB: 1945-04-30, 68 y.o.   MRN: [id number]     HPI: Erik Adams is a 68 y.o. male who presents to the office today for a 2 month followup of his ischemic cardiomyopathy.  Mrs. Meikle is a former patient of Dr. Peter [place]. In 2000 he underwent bare-metal stenting to his LAD and in March 2012 underwent DES stenting to his LAD and ramus intermediate vessel. 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 In January 2014 he was hospitalized with increasing shortness of breath and was profoundly hypoxic, febrile and required high level support. Ejection fraction on Myoview imaging was 32% an echo Doppler assessment was 25% with restrictive physiology. I initially saw him in March 2014 to establish care with me I try to supervise his medications. A that time, I attempted to start bidil rather than the hydralazine that he was taking most likely incorrect. At T\that time he was also intermittently only taking isosorbide dinitrate.  I saw him in followup on 06/04/2012 tried again to further consolidate his medications. At that time I further titrate his Bidil to 2 tablets twice a day for 2 weeks and then recommended he increase this to 2 tablets 3 times a day.  I also discusses potential need for potential future defibrillator insertion but at the time he was pretty adamant against having this.  I last saw him a year ago.  He had had confusion with his medications.  He did see Penni Bombard.D. in followup to assist with his medication compliance.  Most recently, he has been taking amiodarone 200 mg daily, carvedilol 25 mg twice a day.  I believe he is taking Lasix 120 mg every morning.  He has hydralazine 25 mg pills and has been taking 3 tablets twice a day, as well as isosorbide mononitrate  60 mg twice a day.  He is maintained on Coumadin anticoagulation.  He denies breakthrough bleeding.  He complains of being unsteady on his feet.  He denies any frank orthostasis.  There is no presyncope or syncope.  He is unaware of any arrhythmia.    When I last saw him, I recommended further titration of his hydralazine to 75 mg 3 times a day.  I also recommended reassessment of his LV function on echo Doppler study which was done on 12/06/2013.  This now revealed improvement in LV function tests that his ejection fraction was 35-40%.  His LV cavity was moderately dilated.  There was severe left ventricular hypertrophy.  There was grade 1 diastolic dysfunction, mild aortic insufficiency and mild mitral regurgitation.  Left atrium was moderately dilated.  He never had followed up and getting his blood work done.  He comes to the office today with his daughter who is a Teacher, early years/pre at [employer].  He had not had his INR drawn for 2 months.  He states that he has felt better.  He denies significant shortness of breath.  He denies PND, orthopnea.  Past Medical History  Diagnosis Date  . CAD (coronary artery disease)     myoview 03/02/12-EF 32%, mod fixed defect in the inf wall without significant ischemia; a. s/p BMS to mLAD 2000;  b. LHC 3/12:  sig ISR mLAD, RI 80-90%, Dx1 90% (too small for PCI);  PCI:  ION DES to mLAD, ION DES  to RI  . HTN (hypertension)   . Hypercholesteremia   . CVA (cerebral infarction)   . CRI (chronic renal insufficiency)     Baseline creatinine of 1.4 to 1.7  . 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 Atrial fibrillation 11/13/2011    coumadin and amiodarone; s/p DCCV November 2013; repeat DCCV Feb 13, 2012  . Left atrial thrombus     TEE 11/2011  . Iron deficiency anemia   . Cardiomyopathy, ischemic     echo 02/28/12-Ef 25%, mod mr, mild-mod TR, mod pul htn; refuses ICD    Past Surgical History  Procedure Laterality Date  . Coronary angioplasty with  stent placement  2000    BMS to the LAD  . Mastoidectomy    . Coronary angioplasty with stent placement  March 2012    DES to LAD and Ramus intermdius  . Tee without cardioversion  12/06/2011    Procedure: TRANSESOPHAGEAL ECHOCARDIOGRAM (TEE);  Surgeon: Pricilla Riffle, MD;  Location: [facility] ENDOSCOPY;  Service: Cardiovascular;  Laterality: N/A;  . Cardioversion  01/06/2012    Procedure: CARDIOVERSION;  Surgeon: Wendall Stade, MD;  Location: [facility] ENDOSCOPY;  Service: Cardiovascular;  Laterality: N/A;  call anesthesia  maybe able to do cardio. early at 12:00  . Cardioversion  02/13/2012    Procedure: CARDIOVERSION;  Surgeon: Cassell Clement, MD;  Location: [facility] ENDOSCOPY;  Service: Cardiovascular;  Laterality: N/A;  . Tonsillectomy    . Cholecystectomy  1999  . Left inguinal hernia repair  02/2007  . Hernia repair    . Coronary angioplasty with stent placement  04/2000    DES to LAD and ramus intermediate vessel    Allergies  Allergen Reactions  . Amlodipine Shortness Of Breath, Swelling and Palpitations    "Caused  Heart condition-per patient"  . Ace Inhibitors Other (See Comments)    Unknown reaction  . Clonidine Derivatives Other (See Comments)    Unknown reaction    Current Outpatient Prescriptions  Medication Sig Dispense Refill  . amiodarone (PACERONE) 200 MG tablet Take 1 tablet (200 mg total) by mouth daily. 30 tablet 6  . aspirin 81 MG chewable tablet Chew 1 tablet (81 mg total) by mouth daily.    . carvedilol (COREG) 25 MG tablet TAKE 1 TABLET TWICE DAILY WITH FOOD. 120 tablet 2  . furosemide (LASIX) 40 MG tablet TAKE 3 TABLETS EVERY AM. 90 tablet 6  . hydrALAZINE (APRESOLINE) 25 MG tablet Take 3 tablets three times a day 270 tablet 6  . isosorbide mononitrate (IMDUR) 60 MG 24 hr tablet Take 1 tablet (60 mg total) by mouth 2 (two) times daily. 60 tablet 6  . nitroGLYCERIN (NITROSTAT) 0.4 MG SL tablet Place 1 tablet (0.4 mg total) under the tongue every 5 (five) minutes x 3 doses as  needed. For chest pain 25 tablet 6  . potassium chloride SA (K-DUR,KLOR-CON) 20 MEQ tablet TAKE (2) TABLETS TWICE DAILY. 120 tablet 11  . warfarin (COUMADIN) 5 MG tablet Take 1 tablet by mouth daily or as directed 90 tablet 1   No current facility-administered medications for this visit.    Socially he is widowed has 5 children 9 grandchildren no great-grandchildren. His wife died of brain cancer. 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 His 5 brothers 2 sisters and 5 children.   ROS General: Negative; No fevers, chills, or night sweats; weakness on his feet HEENT: Negative; No changes in vision or hearing, sinus congestion, difficulty  swallowing Pulmonary: Negative; No cough, wheezing, shortness of breath, hemoptysis Cardiovascular: Negative; No chest pain, presyncope, syncope, palpitations GI: Negative; No nausea, vomiting, diarrhea, or abdominal pain GU: Negative; No dysuria, hematuria, or difficulty voiding Musculoskeletal: Negative; no myalgias, joint pain, or weakness Hematologic/Oncology: Negative; no easy bruising, bleeding Endocrine: Negative; no heat/cold intolerance; no diabetes Neuro: Negative; no changes in balance, headaches Skin: Negative; No rashes or skin lesions Psychiatric: Negative; No behavioral problems, depression Sleep: Negative; No snoring, daytime sleepiness, hypersomnolence, bruxism, restless legs, hypnogognic hallucinations, no cataplexy Other comprehensive 14 point system review is negative.  PE BP 168/98 mmHg  Pulse 66  Ht 5\' 10"  (1.778 m)  Wt 160 lb 11.2 oz (72.893 kg)  BMI 23.06 kg/m2  Repeat blood pressure by me was 168/95, when taken by me. General: Alert, oriented, no distress.  Skin: normal turgor, no rashes HEENT: Normocephalic, atraumatic. Pupils round and reactive; sclera anicteric;no lid lag.  Nose without nasal septal hypertrophy Mouth/Parynx benign; Mallinpatti scale 3  Neck: No JVD, no carotid bruits with  normal carotid upstroke Lungs: clear to ausculatation and percussion; no wheezing or rales Chest wall: Nontender to palpation Heart: RRR, s1 s2 normal 1/6 systolic murmur.  No diastolic murmur.  No rubs, thrills or heaves; No S3 gallop. Abdomen: soft, nontender; no hepatosplenomehaly, BS+; abdominal aorta nontender and not dilated by palpation. Back: No CVA tenderness Pulses 2+ Extremities: no clubbing cyanosis or edema, Homan's sign negative  Neurologic: grossly nonfocal Psychological: Normal affect  ECG (independently read by me): Normal sinus rhythm at 66..  First degree AV block.  LVH with QRS widening and repolarization changes.  QTc interval 470 ms.  ECG (independently read by me): Normal sinus rhythm.  Borderline first-degree AV block with PR interval 204 ms.  LVH with repolarization changes.  ECG normal sinus rhythm with first-degree AV block the there is evidence for LVH with QRS widening and repolarization changes. PR interval 210 ms QTc interval 499 ms.  LABS:  BMET    Component Value Date/Time   NA 135 09/23/2012 0815   K 4.7 09/23/2012 0815   CL 100 09/23/2012 0815   CO2 27 09/23/2012 0815   GLUCOSE 115* 09/23/2012 0815   BUN 37* 09/23/2012 0815   CREATININE 1.69* 09/23/2012 0815   CREATININE 1.81* 08/27/2012 1441   CALCIUM 9.0 09/23/2012 0815   GFRNONAA 40* 09/23/2012 0815   GFRNONAA 32* 08/11/2012 1625   GFRAA 47* 09/23/2012 0815   GFRAA 37* 08/11/2012 1625     Hepatic Function Panel     Component Value Date/Time   PROT 6.6 02/27/2012 1345   ALBUMIN 3.4* 02/27/2012 1345   AST 36 02/27/2012 1345   ALT 33 02/27/2012 1345   ALKPHOS 93 02/27/2012 1345   BILITOT 1.0 02/27/2012 1345   BILIDIR 0.2 11/13/2011 1617     CBC    Component Value Date/Time   WBC 11.4* 09/23/2012 0815   RBC 3.97* 09/23/2012 0815   RBC 4.30 12/06/2011 0253   HGB 10.8* 09/23/2012 0815   HCT 34.2* 09/23/2012 0815   PLT 169 09/23/2012 0815   MCV 86.1 09/23/2012 0815   MCH 27.2  09/23/2012 0815   MCHC 31.6 09/23/2012 0815   RDW 17.1* 09/23/2012 0815   LYMPHSABS 1.0 09/23/2012 0815   MONOABS 1.1* 09/23/2012 0815   EOSABS 0.1 09/23/2012 0815   BASOSABS 0.0 09/23/2012 0815     BNP    Component Value Date/Time   PROBNP 1711.0* 03/18/2012 1641    Lipid Panel  Component Value Date/Time   CHOL 98 11/13/2011 1617   TRIG 43.0 11/13/2011 1617   HDL 34.50* 11/13/2011 1617   CHOLHDL 3 11/13/2011 1617   VLDL 8.6 11/13/2011 1617   LDLCALC 55 11/13/2011 1617     RADIOLOGY: No results found.    ASSESSMENT AND PLAN:   Mr. Saxman is a 68 year old gentleman with an ischemic cardiomyopathy who is status post stenting of his LAD and ramus intermediate vessel. Has a history of atrial fibrillation and remote documented LAD thrombus for, which she's been on warfarin anticoagulation, renal insufficiency, hypertension, hyperlipidemia remote CVA. He has history restrictive physiology with an EF of 25% on echo Doppler assessment and has a moderate fixed defect in inferior wall without ischemia on Myoview imaging.  Most recent echo now demonstrates some improvement of LV function with an ejection fraction of 35-40%.  His LV is dilated and he is has severe LVH with grade 1 diastolic dysfunction.  In the past, he was unable to tolerate ace inhibitors due to his renal insufficiency.  He also could not tolerate amiodarone.  He has not had follow-up laboratory site do not know value of his his most recent creatinine and chronic kidney disease stage.  I will therefore not implement ARB therapy.  His blood pressure is still elevated.  I will add Cardura initially at 1 mg at bedtime to his medical regimen.  I did also obtain an INR today in the office and this was 2.0 on his current dose of Coumadin.  He also has been taking Crestor 10 mg for hyperlipidemia.  Repeat laboratory will be obtained.  I will see him in 3 months for reevaluation.  Time spent: 25 minutes   Lennette Bihari,  MD, [facility]  01/04/2014 11:49 AM

## 2014-01-20 ENCOUNTER — Encounter (INDEPENDENT_AMBULATORY_CARE_PROVIDER_SITE_OTHER): Payer: Medicare Other | Admitting: Ophthalmology

## 2014-01-20 DIAGNOSIS — H35033 Hypertensive retinopathy, bilateral: Secondary | ICD-10-CM

## 2014-01-20 DIAGNOSIS — H34831 Tributary (branch) retinal vein occlusion, right eye: Secondary | ICD-10-CM

## 2014-01-20 DIAGNOSIS — I10 Essential (primary) hypertension: Secondary | ICD-10-CM

## 2014-01-20 DIAGNOSIS — H2512 Age-related nuclear cataract, left eye: Secondary | ICD-10-CM

## 2014-01-20 DIAGNOSIS — H34812 Central retinal vein occlusion, left eye: Secondary | ICD-10-CM

## 2014-01-20 DIAGNOSIS — H43813 Vitreous degeneration, bilateral: Secondary | ICD-10-CM

## 2014-02-02 ENCOUNTER — Telehealth: Payer: Self-pay | Admitting: *Deleted

## 2014-02-02 ENCOUNTER — Other Ambulatory Visit: Payer: Self-pay | Admitting: *Deleted

## 2014-02-02 MED ORDER — ROSUVASTATIN CALCIUM 20 MG PO TABS: 20.0000 mg | ORAL_TABLET | Freq: Every day | ORAL | Status: AC

## 2014-02-02 NOTE — Telephone Encounter (Signed)
Left message to return a call. 

## 2014-02-14 ENCOUNTER — Ambulatory Visit (INDEPENDENT_AMBULATORY_CARE_PROVIDER_SITE_OTHER): Payer: Medicare Other | Admitting: Pharmacist Clinician (PhC)/ Clinical Pharmacy Specialist

## 2014-02-14 DIAGNOSIS — I4891 Unspecified atrial fibrillation: Secondary | ICD-10-CM

## 2014-02-14 DIAGNOSIS — I513 Intracardiac thrombosis, not elsewhere classified: Secondary | ICD-10-CM

## 2014-02-14 DIAGNOSIS — I5189 Other ill-defined heart diseases: Secondary | ICD-10-CM

## 2014-02-14 DIAGNOSIS — Z7901 Long term (current) use of anticoagulants: Secondary | ICD-10-CM

## 2014-02-14 LAB — POCT INR: INR: 2.4

## 2014-02-16 IMAGING — CR DG CHEST 1V PORT
1 series · 1 of 1 positions shown · non-contrast
Comparison: 02/27/2012

CLINICAL DATA: ARDS.

PORTABLE CHEST - 1 VIEW

[AP]
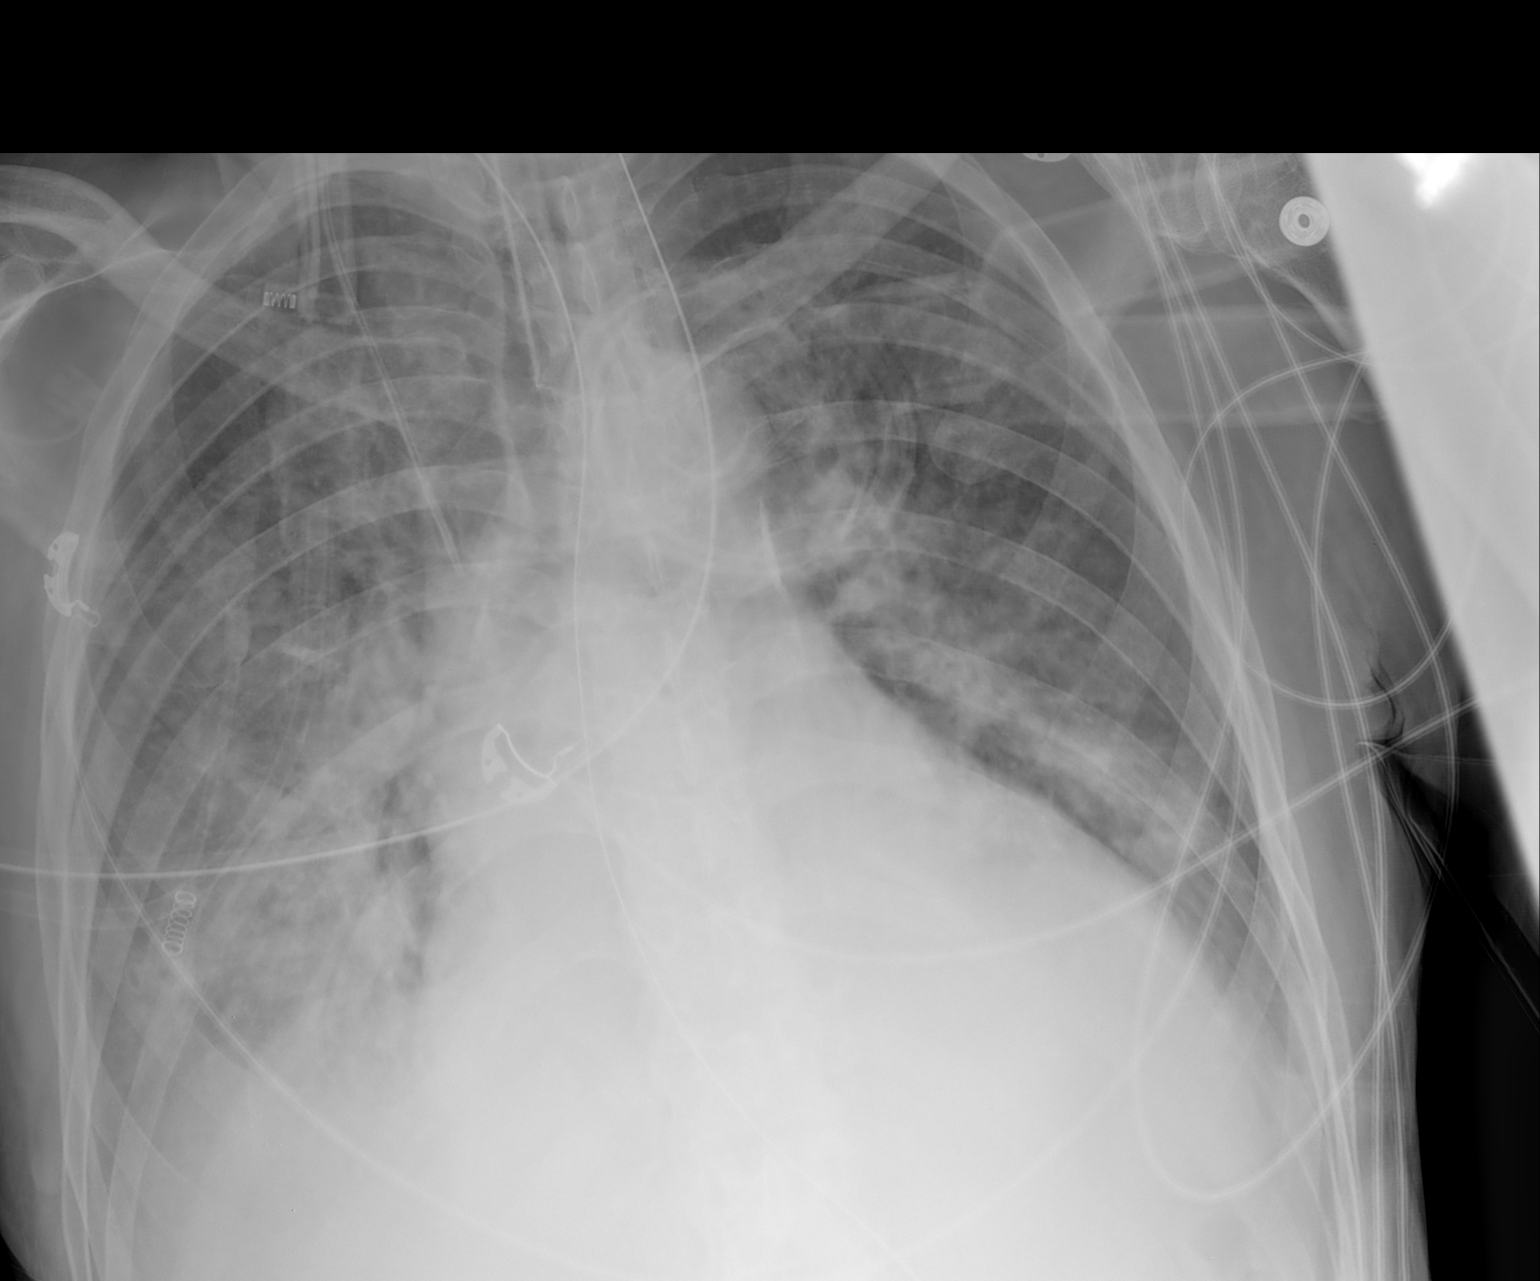

[1 of 1 positions shown; findings below may reference images not displayed]

FINDINGS: Endotracheal tube tip measures 5.4 cm above the carina.
Enteric tube tip is not visible but is below the left hemidiaphragm
consistent with location or distal to the stomach.  Right central
venous catheter with tip  over the upper SVC region.  Cardiac
enlargement with pulmonary vascular congestion and perihilar
infiltration suggesting edema or ARDS.  Probable small pleural
effusions.  No pneumothorax. No definite change since previous
study.
IMPRESSION: Appliances appear in satisfactory position.  Cardiac enlargement
with pulmonary vascular congestion and small bilateral pleural
effusions.  Bilateral perihilar infiltration may represent edema or
ARDS.  No significant change.

## 2014-03-14 ENCOUNTER — Ambulatory Visit (INDEPENDENT_AMBULATORY_CARE_PROVIDER_SITE_OTHER): Payer: Medicare Other | Admitting: Pharmacist Clinician (PhC)/ Clinical Pharmacy Specialist

## 2014-03-14 DIAGNOSIS — I4891 Unspecified atrial fibrillation: Secondary | ICD-10-CM

## 2014-03-14 DIAGNOSIS — I5189 Other ill-defined heart diseases: Secondary | ICD-10-CM

## 2014-03-14 DIAGNOSIS — I513 Intracardiac thrombosis, not elsewhere classified: Secondary | ICD-10-CM

## 2014-03-14 DIAGNOSIS — Z7901 Long term (current) use of anticoagulants: Secondary | ICD-10-CM

## 2014-03-14 LAB — POCT INR: INR: 2.7

## 2014-03-14 NOTE — Addendum Note (Signed)
Addended by: Lindell Spar on: 03/14/2014 04:19 PM   Modules accepted: Level of Service

## 2014-04-11 ENCOUNTER — Ambulatory Visit (INDEPENDENT_AMBULATORY_CARE_PROVIDER_SITE_OTHER): Payer: Medicare Other | Admitting: Cardiovascular Disease

## 2014-04-11 ENCOUNTER — Ambulatory Visit (INDEPENDENT_AMBULATORY_CARE_PROVIDER_SITE_OTHER): Payer: Medicare Other | Admitting: Pharmacist Clinician (PhC)/ Clinical Pharmacy Specialist

## 2014-04-11 VITALS — BP 170/80 | HR 64 | Ht 70.0 in | Wt 165.0 lb

## 2014-04-11 DIAGNOSIS — Z79899 Other long term (current) drug therapy: Secondary | ICD-10-CM

## 2014-04-11 DIAGNOSIS — I2510 Atherosclerotic heart disease of native coronary artery without angina pectoris: Secondary | ICD-10-CM

## 2014-04-11 DIAGNOSIS — I513 Intracardiac thrombosis, not elsewhere classified: Secondary | ICD-10-CM

## 2014-04-11 DIAGNOSIS — Z7901 Long term (current) use of anticoagulants: Secondary | ICD-10-CM

## 2014-04-11 DIAGNOSIS — I10 Essential (primary) hypertension: Secondary | ICD-10-CM

## 2014-04-11 DIAGNOSIS — I255 Ischemic cardiomyopathy: Secondary | ICD-10-CM

## 2014-04-11 DIAGNOSIS — I5189 Other ill-defined heart diseases: Secondary | ICD-10-CM

## 2014-04-11 DIAGNOSIS — N183 Chronic kidney disease, stage 3 unspecified: Secondary | ICD-10-CM

## 2014-04-11 DIAGNOSIS — I4891 Unspecified atrial fibrillation: Secondary | ICD-10-CM

## 2014-04-11 DIAGNOSIS — N184 Chronic kidney disease, stage 4 (severe): Secondary | ICD-10-CM

## 2014-04-11 DIAGNOSIS — I2583 Coronary atherosclerosis due to lipid rich plaque: Principal | ICD-10-CM

## 2014-04-11 DIAGNOSIS — E785 Hyperlipidemia, unspecified: Secondary | ICD-10-CM

## 2014-04-11 LAB — POCT INR: INR: 3

## 2014-04-11 NOTE — Patient Instructions (Signed)
Your physician recommends that you return for lab work fasting.  Your physician recommends that you schedule a follow-up appointment in: 3 months.  Bring ALL MEDICATIONS to the office for review.

## 2014-04-12 ENCOUNTER — Encounter: Payer: Self-pay | Admitting: Cardiovascular Disease

## 2014-04-12 ENCOUNTER — Telehealth: Payer: Self-pay

## 2014-04-12 DIAGNOSIS — N184 Chronic kidney disease, stage 4 (severe): Secondary | ICD-10-CM | POA: Insufficient documentation

## 2014-04-12 LAB — LIPID PANEL
CHOLESTEROL: 178 mg/dL (ref 0–200)
HDL: 40 mg/dL (ref >=40)
LDL CALC: 118 mg/dL — AB (ref 0–99)
Total CHOL/HDL Ratio: 4.5 Ratio
Triglycerides: 98 mg/dL (ref <150)
VLDL: 20 mg/dL (ref 0–40)

## 2014-04-12 LAB — CBC
HCT: 36.4 % — ABNORMAL LOW (ref 39.0–52.0)
HEMOGLOBIN: 12.1 g/dL — AB (ref 13.0–17.0)
MCH: 30.6 pg (ref 26.0–34.0)
MCHC: 33.2 g/dL (ref 30.0–36.0)
MCV: 91.9 fL (ref 78.0–100.0)
MPV: 11.3 fL (ref 8.6–12.4)
PLATELETS: 153 10*3/uL (ref 150–400)
RBC: 3.96 MIL/uL — ABNORMAL LOW (ref 4.22–5.81)
RDW: 13.3 % (ref 11.5–15.5)
WBC: 6 10*3/uL (ref 4.0–10.5)

## 2014-04-12 LAB — COMPREHENSIVE METABOLIC PANEL
ALK PHOS: 53 U/L (ref 39–117)
ALT: 11 U/L (ref 0–53)
AST: 18 U/L (ref 0–37)
Albumin: 3.8 g/dL (ref 3.5–5.2)
BILIRUBIN TOTAL: 0.6 mg/dL (ref 0.2–1.2)
BUN: 31 mg/dL — ABNORMAL HIGH (ref 6–23)
CO2: 26 mEq/L (ref 19–32)
CREATININE: 1.74 mg/dL — AB (ref 0.50–1.35)
Calcium: 8.6 mg/dL (ref 8.4–10.5)
Chloride: 104 mEq/L (ref 96–112)
Glucose, Bld: 88 mg/dL (ref 70–99)
Potassium: 4.4 mEq/L (ref 3.5–5.3)
SODIUM: 139 meq/L (ref 135–145)
TOTAL PROTEIN: 5.7 g/dL — AB (ref 6.0–8.3)

## 2014-04-12 LAB — TSH: TSH: 7.318 u[IU]/mL — AB (ref 0.350–4.500)

## 2014-04-12 NOTE — Telephone Encounter (Signed)
Patient came to office to review medications.Patient brought medications,each medication reviewed with patient.Spoke to [facility] after reviewing meds.Dr.Kelly advised the following changes.Advised to take Hydralazine 50 mg 1&1/2 tablets three times a day,Crestor 20 mg daily,Furosemide 40 mg daily,Potassium 20 meq daily.Contiune all other medications as directed.Patient stated he did have fasting lab work today.2 month follow up appointment scheduled with [facility] 06/13/14 at 3:45 pm.Advised to bring medications to every visit.

## 2014-04-12 NOTE — Progress Notes (Signed)
Patient ID: Erik Adams, male   DOB: Jun 11, 1945, 69 y.o.   MRN: [id number]     HPI: Erik Adams is a 69 y.o. male who presents to the office today for a 2 month followup of his ischemic cardiomyopathy.  Erik Adams is a former patient of Dr. Peter Martinique. In 2000 he underwent bare-metal stenting to his LAD and in March 2012 underwent DES stenting to his LAD and ramus intermediate vessel. 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 In January 2014 he was hospitalized with increasing shortness of breath and was profoundly hypoxic, febrile and required high level support. Ejection fraction on Myoview imaging was 32% an echo Doppler assessment was 25% with restrictive physiology. I initially saw him in March 2014 to establish care with me I try to supervise his medications. A that time, I attempted to start bidil rather than the hydralazine that he was taking most likely incorrect. At that time he was also intermittently only taking isosorbide dinitrate.  I saw him in followup on 06/04/2012 tried again to further consolidate his medications. At that time I further titrate his Bidil to 2 tablets twice a day for 2 weeks and then recommended he increase this to 2 tablets 3 times a day.  I also discusses potential need for potential future defibrillator insertion but at the time he was pretty adamant against having this.  I last saw him a year ago.  He had had confusion with his medications.  He did see [name] in followup to assist with his medication compliance.  When I last saw him, I recommended further titration of his hydralazine to 75 mg 3 times a day.  I also recommended reassessment of his LV function on echo Doppler study which was done on 12/06/2013.  This now revealed improvement in LV function tests  that his ejection fraction was 35-40%.  His LV cavity was moderately dilated.  There was severe left ventricular hypertrophy.  There was grade 1 diastolic dysfunction, mild aortic insufficiency and mild mitral regurgitation.  Left atrium was moderately dilated.  He never had followed up and getting his blood work done.    He denies recent chest pain.  He denies significant shortness of breath.  He typically goes to bed at 3 AM but wakes up around noon.  Erik Adams is somewhat confused as to exactly what doses of medications he is taking.  For this reason, after I completed the office visit, I recommended that he go home and recheck his medications and come back to the office on the following day to tell us exactly what he is taking.  I also recommended fasting blood work to be drawn so that I would have some ideas to where he stands with reference to renal function and other laboratory.  Past Medical History  Diagnosis Date  . CAD (coronary artery disease)     myoview 03/02/12-EF 32%, mod fixed defect in the inf wall without significant ischemia; a. s/p BMS to mLAD 2000;  b. LHC 3/12:  sig ISR mLAD, RI 80-90%, Dx1 90% (too small for PCI);  PCI:  ION DES to mLAD, ION DES to RI  . HTN (hypertension)   . Hypercholesteremia   . CVA (cerebral infarction)   . CRI (chronic renal insufficiency)     Baseline creatinine of 1.4 to 1.7  .  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 Atrial fibrillation 11/13/2011    coumadin and amiodarone; s/p DCCV November 2013; repeat DCCV Feb 13, 2012  . Left atrial thrombus     TEE 11/2011  . Iron deficiency anemia   . Cardiomyopathy, ischemic     echo 02/28/12-Ef 25%, mod mr, mild-mod TR, mod pul htn; refuses ICD    Past Surgical History  Procedure Laterality Date  . Coronary angioplasty with stent placement  2000    BMS to the LAD  . Mastoidectomy    . Coronary angioplasty with stent placement  March 2012    DES to LAD and Ramus intermdius  . Tee  without cardioversion  12/06/2011    Procedure: TRANSESOPHAGEAL ECHOCARDIOGRAM (TEE);  Surgeon: Fay Records, MD;  Location: [facility] ENDOSCOPY;  Service: Cardiovascular;  Laterality: N/A;  . Cardioversion  01/06/2012    Procedure: CARDIOVERSION;  Surgeon: Josue Hector, MD;  Location: [facility] ENDOSCOPY;  Service: Cardiovascular;  Laterality: N/A;  call anesthesia  maybe able to do cardio. early at 12:00  . Cardioversion  02/13/2012    Procedure: CARDIOVERSION;  Surgeon: Darlin Coco, MD;  Location: [facility] ENDOSCOPY;  Service: Cardiovascular;  Laterality: N/A;  . Tonsillectomy    . Cholecystectomy  1999  . Left inguinal hernia repair  02/2007  . Hernia repair    . Coronary angioplasty with stent placement  04/2000    DES to LAD and ramus intermediate vessel    Allergies  Allergen Reactions  . Amlodipine Shortness Of Breath, Swelling and Palpitations    "Caused  Heart condition-per patient"  . Ace Inhibitors Other (See Comments)    Unknown reaction  . Clonidine Derivatives Other (See Comments)    Unknown reaction    Current Outpatient Prescriptions  Medication Sig Dispense Refill  . amiodarone (PACERONE) 200 MG tablet Take 1 tablet (200 mg total) by mouth daily. 30 tablet 6  . aspirin 81 MG chewable tablet Chew 1 tablet (81 mg total) by mouth daily.    . carvedilol (COREG) 25 MG tablet TAKE 1 TABLET TWICE DAILY WITH FOOD. 120 tablet 2  . doxazosin (CARDURA) 1 MG tablet Take 1 tablet (1 mg total) by mouth at bedtime. 30 tablet 6  . isosorbide mononitrate (IMDUR) 60 MG 24 hr tablet Take 1 tablet (60 mg total) by mouth 2 (two) times daily. 60 tablet 6  . nitroGLYCERIN (NITROSTAT) 0.4 MG SL tablet Place 1 tablet (0.4 mg total) under the tongue every 5 (five) minutes x 3 doses as needed. For chest pain 25 tablet 6  . rosuvastatin (CRESTOR) 20 MG tablet Take 1 tablet (20 mg total) by mouth daily. 30 tablet 6  . warfarin (COUMADIN) 5 MG tablet Take 1 tablet by mouth daily or as directed 90 tablet 1  .  furosemide (LASIX) 40 MG tablet Take 1 tablet (40 mg total) by mouth daily. 90 tablet 3  . hydrALAZINE (APRESOLINE) 50 MG tablet Take 75 mg 1&1/2 tablets three times a day 270 tablet 3  . potassium chloride SA (K-DUR,KLOR-CON) 20 MEQ tablet Take 1 tablet (20 mEq total) by mouth daily. 30 tablet 6   No current facility-administered medications for this visit.    Socially he is widowed has 5 children 9 grandchildren no great-grandchildren. His wife died of brain cancer. 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 His 5 brothers 2 sisters and 5 children.   ROS General: Negative; No fevers, chills, or night sweats; weakness  on his feet HEENT: Negative; No changes in vision or hearing, sinus congestion, difficulty swallowing Pulmonary: Negative; No cough, wheezing, shortness of breath, hemoptysis Cardiovascular: Negative; No chest pain, presyncope, syncope, palpitations GI: Negative; No nausea, vomiting, diarrhea, or abdominal pain GU: Negative; No dysuria, hematuria, or difficulty voiding Musculoskeletal: Negative; no myalgias, joint pain, or weakness Hematologic/Oncology: Negative; no easy bruising, bleeding Endocrine: Negative; no heat/cold intolerance; no diabetes Neuro: Negative; no changes in balance, headaches Skin: Negative; No rashes or skin lesions Psychiatric: Negative; No behavioral problems, depression Sleep: Negative; No snoring, daytime sleepiness, hypersomnolence, bruxism, restless legs, hypnogognic hallucinations, no cataplexy Other comprehensive 14 point system review is negative.  PE BP 170/80 mmHg  Pulse 64  Ht $R'5\' 10"'Kb$  (1.778 m)  Wt 165 lb (74.844 kg)  BMI 23.68 kg/m2  Repeat blood pressure by me was 168/95, when taken by me. General: Alert, oriented, no distress.  Skin: normal turgor, no rashes HEENT: Normocephalic, atraumatic. Pupils round and reactive; sclera anicteric;no lid lag.  Nose without nasal septal hypertrophy Mouth/Parynx benign;  Mallinpatti scale 3  Neck: No JVD, no carotid bruits with normal carotid upstroke Lungs: clear to ausculatation and percussion; no wheezing or rales Chest wall: Nontender to palpation Heart: RRR, s1 s2 normal 1/6 systolic murmur.  No diastolic murmur.  No rubs, thrills or heaves; No S3 gallop. Abdomen: soft, nontender; no hepatosplenomehaly, BS+; abdominal aorta nontender and not dilated by palpation. Back: No CVA tenderness Pulses 2+ Extremities: no clubbing cyanosis or edema, Homan's sign negative  Neurologic: grossly nonfocal Psychological: Normal affect and mood   ECG (independently read by me): Normal sinus rhythm at 64 bpm.  LVH with probable repolarization changes.  T-wave inversion in leads 1 and L, V4 through V6.  QTc interval 474 ms.  01/04/2014 ECG (independently read by me): Normal sinus rhythm at 66..  First degree AV block.  LVH with QRS widening and repolarization changes.  QTc interval 470 ms.  Prior ECG (independently read by me): Normal sinus rhythm.  Borderline first-degree AV block with PR interval 204 ms.  LVH with repolarization changes.  Prior ECG normal sinus rhythm with first-degree AV block the there is evidence for LVH with QRS widening and repolarization changes. PR interval 210 ms QTc interval 499 ms.  LABS:  BMET  BMP Latest Ref Rng 01/04/2014 09/23/2012 08/27/2012  Glucose 70 - 99 mg/dL 95 115(H) 90  BUN 6 - 23 mg/dL 49(H) 37(H) 31(H)  Creatinine 0.50 - 1.35 mg/dL 2.38(H) 1.69(H) 1.81(H)  Sodium 135 - 145 mEq/L 139 135 136  Potassium 3.5 - 5.3 mEq/L 4.9 4.7 4.3  Chloride 96 - 112 mEq/L 101 100 100  CO2 19 - 32 mEq/L $Remove'27 27 28  '[id number]$ Calcium 8.4 - 10.5 mg/dL 9.0 9.0 9.3    Hepatic Function Panel   Hepatic Function Latest Ref Rng 01/04/2014 02/27/2012 11/13/2011  Total Protein 6.0 - 8.3 g/dL 6.4 6.6 5.8(L)  Albumin 3.5 - 5.2 g/dL 4.1 3.4(L) 3.4(L)  AST 0 - 37 U/L 24 36 27  ALT 0 - 53 U/L 19 33 21  Alk Phosphatase 39 - 117 U/L 72 93 78  Total Bilirubin 0.2  - 1.2 mg/dL 0.7 1.0 1.3(H)  Bilirubin, Direct 0.0 - 0.3 mg/dL - - 0.2    CBC   CBC Latest Ref Rng 01/04/2014 09/23/2012 03/02/2012  WBC 4.0 - 10.5 K/uL 7.1 11.4(H) 7.9  Hemoglobin 13.0 - 17.0 g/dL 13.1 10.8(L) 9.7(L)  Hematocrit 39.0 - 52.0 % 38.7(L) 34.2(L) 31.3(L)  Platelets 150 - 400  K/uL 201 169 216    BNP    Component Value Date/Time   PROBNP 1711.0* 03/18/2012 1641    Lipid Panel     Component Value Date/Time   CHOL 206* 01/04/2014 1340   TRIG 142 01/04/2014 1340   HDL 39* 01/04/2014 1340   CHOLHDL 5.3 01/04/2014 1340   VLDL 28 01/04/2014 1340   LDLCALC 139* 01/04/2014 1340     RADIOLOGY: No results found.    ASSESSMENT AND PLAN:   Mr. Abalos is a 69 year old gentleman with an ischemic cardiomyopathy who is status post stenting of his LAD and ramus intermediate vessel. Has a history of atrial fibrillation and remotely documented left atrial  thrombus for which he has been on warfarin anticoagulation.  He has a history of renal insufficiency, hypertension, hyperlipidemia , and remote CVA. He has  restrictive physiology with an EF of 25% on echo Doppler assessment and has a moderate fixed defect in inferior wall without ischemia on Myoview imaging.  His most recent echo reveals  some improvement of LV function with an ejection fraction of 35-40%.  His LV is dilated and he is has severe LVH with grade 1 diastolic dysfunction.  In the past, he was unable to tolerate Ace inhibitors due to his renal insufficiency.  He also could not tolerate amiodarone.  When I last saw him, I added Cardura to his medical regimen since he had not had laboratory in some time and was uncertain of his renal function.  His creatinine in November was elevated at 2.38.  He is confused as to exactly what medications he is taking.  In our records we had him taking 75 mg twice a day, but when he came back to the office today.  He states he has been taking 50 mg 3 times a day, but 100 mg at bedtime.  I'm  not certain if this is correct, as this would be 4 times a day regimen.  He was never instructed to do this.  He also has been on furosemide 80 mg.  I have recommended that he change his hydralazine to 75 mg 3 times a day and decrease his Lasix to 40 mg.  He did not have signs of volume overload on exam.  His INR when checked yesterday was 3.0 on his current dose of warfarin.  He will monitor his blood pressure.  He is maintaining sinus rhythm on 20 mg of amiodarone in addition to carvedilol 25 mg twice a day.  He is not having any anginal symptoms and continues to take isosorbide mononitrate 61 g twice a day.  Repeat laboratory is to be done in the fasting state.  Adjustments will be made if necessary to his medical regimen.  I will see him in 2 months for reevaluation, and I recommended that he bring with him his medication so that we can know exactly what he is taking at the time of his office visit.  Time spent: 25 minutes   Troy Sine, MD, [facility]  04/12/2014 6:35 PM

## 2014-04-14 ENCOUNTER — Encounter (INDEPENDENT_AMBULATORY_CARE_PROVIDER_SITE_OTHER): Payer: Medicare Other | Admitting: Ophthalmology

## 2014-04-14 ENCOUNTER — Telehealth: Payer: Self-pay | Admitting: *Deleted

## 2014-04-14 ENCOUNTER — Telehealth: Payer: Self-pay | Admitting: Cardiovascular Disease

## 2014-04-14 DIAGNOSIS — H2512 Age-related nuclear cataract, left eye: Secondary | ICD-10-CM

## 2014-04-14 DIAGNOSIS — H34831 Tributary (branch) retinal vein occlusion, right eye: Secondary | ICD-10-CM | POA: Diagnosis not present

## 2014-04-14 DIAGNOSIS — H35033 Hypertensive retinopathy, bilateral: Secondary | ICD-10-CM | POA: Diagnosis not present

## 2014-04-14 DIAGNOSIS — H43813 Vitreous degeneration, bilateral: Secondary | ICD-10-CM

## 2014-04-14 DIAGNOSIS — I10 Essential (primary) hypertension: Secondary | ICD-10-CM | POA: Diagnosis not present

## 2014-04-14 DIAGNOSIS — H34812 Central retinal vein occlusion, left eye: Secondary | ICD-10-CM

## 2014-04-14 NOTE — Telephone Encounter (Signed)
-----   Message from Lennette Bihari, MD sent at 04/13/2014  2:12 PM EST ----- Erik Adams; labs were reviewed; renal fxn is better; start synthroid at 50 micrgrams

## 2014-04-14 NOTE — Telephone Encounter (Signed)
Returning your call. °

## 2014-04-14 NOTE — Telephone Encounter (Signed)
Left message for patient to return a call.

## 2014-04-15 NOTE — Telephone Encounter (Signed)
Called patient and received a message that call could not be completed as dialed.  Verified the number and got the same message. Will try again later.

## 2014-04-15 NOTE — Telephone Encounter (Signed)
Patient states he is still waiting for Burna Mortimer to call him back.  He states he will not call again and if she does not want to call him, she does not have to.

## 2014-04-15 NOTE — Telephone Encounter (Signed)
Forward to wanda waddell cma

## 2014-05-05 ENCOUNTER — Other Ambulatory Visit: Payer: Self-pay | Admitting: *Deleted

## 2014-05-05 ENCOUNTER — Encounter: Payer: Self-pay | Admitting: *Deleted

## 2014-05-05 DIAGNOSIS — E039 Hypothyroidism, unspecified: Secondary | ICD-10-CM

## 2014-05-05 DIAGNOSIS — Z79899 Other long term (current) drug therapy: Secondary | ICD-10-CM

## 2014-05-05 MED ORDER — LEVOTHYROXINE SODIUM 50 MCG PO TABS: 50.0000 ug | ORAL_TABLET | Freq: Every day | ORAL | Status: DC

## 2014-05-05 NOTE — Telephone Encounter (Signed)
Note sent to patient

## 2014-05-11 ENCOUNTER — Telehealth: Payer: Self-pay | Admitting: Cardiovascular Disease

## 2014-05-11 NOTE — Telephone Encounter (Signed)
Spoke with pt, questions regarding thyroid answered.

## 2014-05-11 NOTE — Telephone Encounter (Signed)
Pt called in wanting an explaination on what his thyroid is and how it can be effected. Please call back  Thanks

## 2014-05-23 ENCOUNTER — Ambulatory Visit (INDEPENDENT_AMBULATORY_CARE_PROVIDER_SITE_OTHER): Payer: Medicare Other | Admitting: Pharmacist Clinician (PhC)/ Clinical Pharmacy Specialist

## 2014-05-23 DIAGNOSIS — I4891 Unspecified atrial fibrillation: Secondary | ICD-10-CM

## 2014-05-23 DIAGNOSIS — I513 Intracardiac thrombosis, not elsewhere classified: Secondary | ICD-10-CM

## 2014-05-23 DIAGNOSIS — Z7901 Long term (current) use of anticoagulants: Secondary | ICD-10-CM | POA: Diagnosis not present

## 2014-05-23 DIAGNOSIS — I5189 Other ill-defined heart diseases: Secondary | ICD-10-CM | POA: Diagnosis not present

## 2014-05-23 LAB — POCT INR: INR: 3

## 2014-06-13 ENCOUNTER — Ambulatory Visit: Payer: Medicare Other | Admitting: Cardiovascular Disease

## 2014-06-23 ENCOUNTER — Other Ambulatory Visit: Payer: Self-pay | Admitting: Cardiovascular Disease

## 2014-06-24 NOTE — Telephone Encounter (Signed)
Rx(s) sent to pharmacy electronically.  

## 2014-07-01 ENCOUNTER — Other Ambulatory Visit: Payer: Self-pay | Admitting: Cardiovascular Disease

## 2014-07-05 ENCOUNTER — Encounter: Payer: Self-pay | Admitting: Cardiovascular Disease

## 2014-07-05 ENCOUNTER — Ambulatory Visit (INDEPENDENT_AMBULATORY_CARE_PROVIDER_SITE_OTHER): Payer: Medicare Other | Admitting: Cardiovascular Disease

## 2014-07-05 ENCOUNTER — Ambulatory Visit (INDEPENDENT_AMBULATORY_CARE_PROVIDER_SITE_OTHER): Payer: Medicare Other

## 2014-07-05 VITALS — BP 192/100 | HR 65 | Ht 70.0 in | Wt 163.2 lb

## 2014-07-05 DIAGNOSIS — I4891 Unspecified atrial fibrillation: Secondary | ICD-10-CM

## 2014-07-05 DIAGNOSIS — N184 Chronic kidney disease, stage 4 (severe): Secondary | ICD-10-CM

## 2014-07-05 DIAGNOSIS — Z7901 Long term (current) use of anticoagulants: Secondary | ICD-10-CM

## 2014-07-05 DIAGNOSIS — I10 Essential (primary) hypertension: Secondary | ICD-10-CM

## 2014-07-05 DIAGNOSIS — I255 Ischemic cardiomyopathy: Secondary | ICD-10-CM | POA: Diagnosis not present

## 2014-07-05 DIAGNOSIS — I5189 Other ill-defined heart diseases: Secondary | ICD-10-CM

## 2014-07-05 DIAGNOSIS — I2510 Atherosclerotic heart disease of native coronary artery without angina pectoris: Secondary | ICD-10-CM | POA: Diagnosis not present

## 2014-07-05 DIAGNOSIS — I513 Intracardiac thrombosis, not elsewhere classified: Secondary | ICD-10-CM

## 2014-07-05 DIAGNOSIS — N183 Chronic kidney disease, stage 3 unspecified: Secondary | ICD-10-CM

## 2014-07-05 DIAGNOSIS — Z79899 Other long term (current) drug therapy: Secondary | ICD-10-CM

## 2014-07-05 LAB — TSH: TSH: 0.904 u[IU]/mL (ref 0.350–4.500)

## 2014-07-05 LAB — POCT INR: INR: 4.2

## 2014-07-05 MED ORDER — HYDRALAZINE HCL 100 MG PO TABS: 100.0000 mg | ORAL_TABLET | Freq: Three times a day (TID) | ORAL | Status: DC

## 2014-07-05 MED ORDER — DILTIAZEM HCL ER COATED BEADS 120 MG PO CP24: 120.0000 mg | ORAL_CAPSULE | Freq: Every day | ORAL | Status: DC

## 2014-07-05 NOTE — Patient Instructions (Signed)
Your physician recommends that you schedule a follow-up appointment in: 6-8 Weeks  Your physician recommends that you schedule a follow-up appointment in: 1 Week Coumadin Check  Your physician has requested that you have a renal artery duplex. During this test, an ultrasound is used to evaluate blood flow to the kidneys. Allow one hour for this exam. Do not eat after midnight the day before and avoid carbonated beverages. Take your medications as you usually do.  Your physician has recommended you make the following change in your medication: Increase Hydralazine 100 mg 1 tablets 3 times a day and START Diltiazem 120 mg at bedtime  Your physician recommends that you return for lab work in: 1 Week, CMP

## 2014-07-07 ENCOUNTER — Encounter: Payer: Self-pay | Admitting: Cardiovascular Disease

## 2014-07-07 NOTE — Progress Notes (Signed)
Patient ID: [name], male   DOB: 17-Dec-1945, 69 y.o.   MRN: [id number]     HPI: Erik Adams is a 69 y.o. male who presents to the office today for a 3 month followup of his ischemic cardiomyopathy.  Erik Adams is a former patient of Dr. Peter Adams. In 2000 he underwent bare-metal stenting to his LAD and in March 2012 underwent DES stenting to his LAD and ramus intermediate vessel. 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 In January 2014 he was hospitalized with increasing shortness of breath and was profoundly hypoxic, febrile and required high level support. Ejection fraction on Myoview imaging was 32% an echo Doppler assessment was 25% with restrictive physiology. I initially saw him in March 2014 to establish care with me I try to supervise his medications. A that time, I attempted to start bidil rather than the hydralazine that he was taking most likely incorrect. At that time he was also intermittently only taking isosorbide dinitrate.  I saw him in followup on 06/04/2012 tried again to further consolidate his medications. At that time I further titrate his Bidil to 2 tablets twice a day for 2 weeks and then recommended he increase this to 2 tablets 3 times a day.  I also discusses potential need for potential future defibrillator insertion but at the time he was pretty adamant against having this.  I last saw him a year ago.  He had had confusion with his medications.  He did see Erik Adams.D. in followup to assist with his medication compliance.  Lat year I recommended further titration of his hydralazine to 75 mg 3 times a day.  I also recommended reassessment of his LV function on echo Doppler study which was done on 12/06/2013.  This revealed improvement in LV function tests that his  ejection fraction was 35-40%.  His LV cavity was moderately dilated.  There was severe left ventricular hypertrophy.  There was grade 1 diastolic dysfunction, mild aortic insufficiency and mild mitral regurgitation.  Left atrium was moderately dilated.    He denies recent chest pain.  He denies significant shortness of breath.  He typically goes to bed at 3 AM but wakes up around noon.  Erik Adams has had issues with confusion with reference to his medications.  However, he now states he is taking hydralazine 75 mg 3 times a day, isosorbide 60 mg twice a day, Crestor 20 g daily, Cardura 1 mg at bedtime, carvedilol 25 mg twice a day.  He continues to take amiodarone 20 mg daily and denies recurrent arrhythmia.  He is on Coumadin 5 mg pill.  He presents for reevaluation  Past Medical History  Diagnosis Date  . CAD (coronary artery disease)     myoview 03/02/12-EF 32%, mod fixed defect in the inf wall without significant ischemia; a. s/p BMS to mLAD 2000;  b. LHC 3/12:  sig ISR mLAD, RI 80-90%, Dx1 90% (too small for PCI);  PCI:  ION DES to mLAD, ION DES to RI  . HTN (hypertension)   . Hypercholesteremia   . CVA (cerebral infarction)   . CRI (chronic renal insufficiency)     Baseline creatinine of 1.4 to 1.7  . 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 Atrial fibrillation 11/13/2011    coumadin and amiodarone; s/p DCCV November  2013; repeat DCCV Feb 13, 2012  . Left atrial thrombus     TEE 11/2011  . Iron deficiency anemia   . Cardiomyopathy, ischemic     echo 02/28/12-Ef 25%, mod mr, mild-mod TR, mod pul htn; refuses ICD    Past Surgical History  Procedure Laterality Date  . Coronary angioplasty with stent placement  2000    BMS to the LAD  . Mastoidectomy    . Coronary angioplasty with stent placement  March 2012    DES to LAD and Ramus intermdius  . Tee without cardioversion  12/06/2011    Procedure: TRANSESOPHAGEAL ECHOCARDIOGRAM (TEE);  Surgeon: Fay Records, MD;   Location: [facility] ENDOSCOPY;  Service: Cardiovascular;  Laterality: N/A;  . Cardioversion  01/06/2012    Procedure: CARDIOVERSION;  Surgeon: Josue Hector, MD;  Location: [facility] ENDOSCOPY;  Service: Cardiovascular;  Laterality: N/A;  call anesthesia  maybe able to do cardio. early at 12:00  . Cardioversion  02/13/2012    Procedure: CARDIOVERSION;  Surgeon: Darlin Coco, MD;  Location: [facility] ENDOSCOPY;  Service: Cardiovascular;  Laterality: N/A;  . Tonsillectomy    . Cholecystectomy  1999  . Left inguinal hernia repair  02/2007  . Hernia repair    . Coronary angioplasty with stent placement  04/2000    DES to LAD and ramus intermediate vessel    Allergies  Allergen Reactions  . Amlodipine Shortness Of Breath, Swelling and Palpitations    "Caused  Heart condition-per patient"  . Ace Inhibitors Other (See Comments)    Unknown reaction  . Clonidine Derivatives Other (See Comments)    Unknown reaction    Current Outpatient Prescriptions  Medication Sig Dispense Refill  . amiodarone (PACERONE) 200 MG tablet TAKE 1 TABLET DAILY. 30 tablet 6  . aspirin 81 MG chewable tablet Chew 1 tablet (81 mg total) by mouth daily.    . carvedilol (COREG) 25 MG tablet TAKE 1 TABLET TWICE DAILY WITH FOOD. 120 tablet 2  . doxazosin (CARDURA) 1 MG tablet Take 1 tablet (1 mg total) by mouth at bedtime. 30 tablet 6  . furosemide (LASIX) 40 MG tablet Take 1 tablet (40 mg total) by mouth daily. 90 tablet 3  . isosorbide mononitrate (IMDUR) 60 MG 24 hr tablet TAKE 1 TABLET TWICE DAILY. 60 tablet 6  . levothyroxine (SYNTHROID, LEVOTHROID) 50 MCG tablet Take 1 tablet (50 mcg total) by mouth daily before breakfast. 30 tablet 6  . nitroGLYCERIN (NITROSTAT) 0.4 MG SL tablet Place 1 tablet (0.4 mg total) under the tongue every 5 (five) minutes x 3 doses as needed. For chest pain 25 tablet 6  . potassium chloride SA (K-DUR,KLOR-CON) 20 MEQ tablet Take 1 tablet (20 mEq total) by mouth daily. 30 tablet 6  . rosuvastatin (CRESTOR) 20  MG tablet Take 1 tablet (20 mg total) by mouth daily. 30 tablet 6  . warfarin (COUMADIN) 5 MG tablet Take 1 tablet by mouth daily or as directed 90 tablet 1  . diltiazem (CARDIZEM CD) 120 MG 24 hr capsule Take 1 capsule (120 mg total) by mouth daily. 30 capsule 6  . hydrALAZINE (APRESOLINE) 100 MG tablet Take 1 tablet (100 mg total) by mouth 3 (three) times daily. 90 tablet 6   No current facility-administered medications for this visit.    Socially he is widowed has 5 children 9 grandchildren no great-grandchildren. His wife died of brain cancer. 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 His 5 brothers 2 sisters and 5  children.   ROS General: Negative; No fevers, chills, or night sweats; weakness on his feet HEENT: Negative; No changes in vision or hearing, sinus congestion, difficulty swallowing Pulmonary: Negative; No cough, wheezing, shortness of breath, hemoptysis Cardiovascular: Negative; No chest pain, presyncope, syncope, palpitations GI: Negative; No nausea, vomiting, diarrhea, or abdominal pain GU: Negative; No dysuria, hematuria, or difficulty voiding Musculoskeletal: Negative; no myalgias, joint pain, or weakness Hematologic/Oncology: Negative; no easy bruising, bleeding Endocrine: Negative; no heat/cold intolerance; no diabetes Neuro: Negative; no changes in balance, headaches Skin: Negative; No rashes or skin lesions Psychiatric: Negative; No behavioral problems, depression Sleep: Negative; No snoring, daytime sleepiness, hypersomnolence, bruxism, restless legs, hypnogognic hallucinations, no cataplexy Other comprehensive 14 point system review is negative.  PE BP 192/100 mmHg  Pulse 65  Ht $R'5\' 10"'cC$  (1.778 m)  Wt 163 lb 3.2 oz (74.027 kg)  BMI 23.42 kg/m2  Repeat blood pressure by me was 180/96, when taken by me.  Wt Readings from Last 3 Encounters:  07/05/14 163 lb 3.2 oz (74.027 kg)  04/11/14 165 lb (74.844 kg)  01/04/14 160 lb 11.2 oz  (72.893 kg)   General: Alert, oriented, no distress.  Skin: normal turgor, no rashes HEENT: Normocephalic, atraumatic. Pupils round and reactive; sclera anicteric;no lid lag.  Nose without nasal septal hypertrophy Mouth/Parynx benign; Mallinpatti scale 3  Neck: No JVD, no carotid bruits with normal carotid upstroke Lungs: clear to ausculatation and percussion; no wheezing or rales Chest wall: Nontender to palpation Heart: RRR, s1 s2 normal 1/6 systolic murmur.  No diastolic murmur.  No rubs, thrills or heaves; No S3 gallop. Abdomen: soft, nontender; no hepatosplenomehaly, BS+; abdominal aorta nontender and not dilated by palpation. Back: No CVA tenderness Pulses 2+ Extremities: no clubbing cyanosis or edema, Homan's sign negative  Neurologic: grossly nonfocal Psychological: Normal affect and mood  ECG (independently read by me): Normal sinus rhythm.  Borderline first-degree AV block.  LVH with QRS widening and T-wave inversion in leads 1 aVL, V5 and V6.  February 2016 ECG (independently read by me): Normal sinus rhythm at 64 bpm.  LVH with probable repolarization changes.  T-wave inversion in leads 1 and L, V4 through V6.  QTc interval 474 ms.  01/04/2014 ECG (independently read by me): Normal sinus rhythm at 66..  First degree AV block.  LVH with QRS widening and repolarization changes.  QTc interval 470 ms.  Prior ECG (independently read by me): Normal sinus rhythm.  Borderline first-degree AV block with PR interval 204 ms.  LVH with repolarization changes.  Prior ECG normal sinus rhythm with first-degree AV block the there is evidence for LVH with QRS widening and repolarization changes. PR interval 210 ms QTc interval 499 ms.  LABS:  BMET  BMP Latest Ref Rng 04/11/2014 01/04/2014 09/23/2012  Glucose 70 - 99 mg/dL 88 95 115(H)  BUN 6 - 23 mg/dL 31(H) 49(H) 37(H)  Creatinine 0.50 - 1.35 mg/dL 1.74(H) 2.38(H) 1.69(H)  Sodium 135 - 145 mEq/L 139 139 135  Potassium 3.5 - 5.3 mEq/L  4.4 4.9 4.7  Chloride 96 - 112 mEq/L 104 101 100  CO2 19 - 32 mEq/L $Remove'26 27 27  'NdBaMsu$ Calcium 8.4 - 10.5 mg/dL 8.6 9.0 9.0    Hepatic Function Panel   Hepatic Function Latest Ref Rng 04/11/2014 01/04/2014 02/27/2012  Total Protein 6.0 - 8.3 g/dL 5.7(L) 6.4 6.6  Albumin 3.5 - 5.2 g/dL 3.8 4.1 3.4(L)  AST 0 - 37 U/L 18 24 36  ALT 0 - 53 U/L 11 19  33  Alk Phosphatase 39 - 117 U/L 53 72 93  Total Bilirubin 0.2 - 1.2 mg/dL 0.6 0.7 1.0  Bilirubin, Direct 0.0 - 0.3 mg/dL - - -    CBC   CBC Latest Ref Rng 04/11/2014 01/04/2014 09/23/2012  WBC 4.0 - 10.5 K/uL 6.0 7.1 11.4(H)  Hemoglobin 13.0 - 17.0 g/dL 12.1(L) 13.1 10.8(L)  Hematocrit 39.0 - 52.0 % 36.4(L) 38.7(L) 34.2(L)  Platelets 150 - 400 K/uL 153 201 169    BNP    Component Value Date/Time   PROBNP 1711.0* 03/18/2012 1641    Lipid Panel     Component Value Date/Time   CHOL 178 04/11/2014 1333   TRIG 98 04/11/2014 1333   HDL 40 04/11/2014 1333   CHOLHDL 4.5 04/11/2014 1333   VLDL 20 04/11/2014 1333   LDLCALC 118* 04/11/2014 1333   INR checked today in the office elevated at 3.2  RADIOLOGY: No results found.   ASSESSMENT AND PLAN:   Erik Adams is a 69 year old gentleman with an ischemic cardiomyopathy who is status post stenting of his LAD and ramus intermediate vessel. Has a history of atrial fibrillation and remotely documented left atrial  thrombus for which he has been on warfarin anticoagulation.  He has a history of renal insufficiency, hypertension, hyperlipidemia , and remote CVA. He has  restrictive physiology with an EF of 25% on echo Doppler assessment and has a moderate fixed defect in inferior wall without ischemia on Myoview imaging.  His last echo has demonstrated mild improvement of LV function with an ejection fraction of 35-40%.  His LV is dilated and he is has severe LVH with grade 1 diastolic dysfunction.  In the past, he was unable to tolerate Ace inhibitors due to his renal insufficiency.  His blood pressure  today is elevated with stage II hypertension.  I have recommended he titrate his hydralazine to 100 mg 3 times a day.  In the past.  He did not tolerate amlodipine.  I will also add low-dose diltiazem CD 120 mg at bedtime.  He was told to take half the dose of his Coumadin today and then I will change him to 5 mg, 5 mg and to have milligrams and alternating days.  He will follow pro time next week and will be checked by Erik Adams our Pharm.D.  I am also scheduling him for renal Doppler studies to make certain his difficult to control blood pressures not due to renovascular etiology.  He will undergo blood work in one week.  I will see him in 6-8 weeks for reevaluation or sooner if problems arise.  Time spent: 25 minutes   Troy Sine, MD, [facility]  07/07/2014 8:23 PM

## 2014-07-08 ENCOUNTER — Encounter: Payer: Self-pay | Admitting: *Deleted

## 2014-07-14 ENCOUNTER — Ambulatory Visit (INDEPENDENT_AMBULATORY_CARE_PROVIDER_SITE_OTHER): Payer: Medicare Other | Admitting: Pharmacist Clinician (PhC)/ Clinical Pharmacy Specialist

## 2014-07-14 DIAGNOSIS — I5189 Other ill-defined heart diseases: Secondary | ICD-10-CM

## 2014-07-14 DIAGNOSIS — Z7901 Long term (current) use of anticoagulants: Secondary | ICD-10-CM

## 2014-07-14 DIAGNOSIS — I513 Intracardiac thrombosis, not elsewhere classified: Secondary | ICD-10-CM

## 2014-07-14 DIAGNOSIS — I4891 Unspecified atrial fibrillation: Secondary | ICD-10-CM

## 2014-07-14 LAB — POCT INR: INR: 3.1

## 2014-07-21 ENCOUNTER — Encounter (INDEPENDENT_AMBULATORY_CARE_PROVIDER_SITE_OTHER): Payer: Medicare Other | Admitting: Ophthalmology

## 2014-07-21 DIAGNOSIS — H34831 Tributary (branch) retinal vein occlusion, right eye: Secondary | ICD-10-CM | POA: Diagnosis not present

## 2014-07-21 DIAGNOSIS — I10 Essential (primary) hypertension: Secondary | ICD-10-CM | POA: Diagnosis not present

## 2014-07-21 DIAGNOSIS — H43813 Vitreous degeneration, bilateral: Secondary | ICD-10-CM | POA: Diagnosis not present

## 2014-07-21 DIAGNOSIS — H35033 Hypertensive retinopathy, bilateral: Secondary | ICD-10-CM

## 2014-07-21 DIAGNOSIS — H34812 Central retinal vein occlusion, left eye: Secondary | ICD-10-CM

## 2014-08-03 ENCOUNTER — Ambulatory Visit (INDEPENDENT_AMBULATORY_CARE_PROVIDER_SITE_OTHER): Payer: Medicare Other | Admitting: Pharmacist

## 2014-08-03 DIAGNOSIS — I4891 Unspecified atrial fibrillation: Secondary | ICD-10-CM | POA: Diagnosis not present

## 2014-08-03 DIAGNOSIS — I513 Intracardiac thrombosis, not elsewhere classified: Secondary | ICD-10-CM

## 2014-08-03 DIAGNOSIS — Z7901 Long term (current) use of anticoagulants: Secondary | ICD-10-CM

## 2014-08-03 DIAGNOSIS — I5189 Other ill-defined heart diseases: Secondary | ICD-10-CM

## 2014-08-03 LAB — POCT INR: INR: 6.6

## 2014-08-04 ENCOUNTER — Ambulatory Visit (HOSPITAL_COMMUNITY)
Admission: RE | Admit: 2014-08-04 | Discharge: 2014-08-04 | Disposition: A | Discharge status: Home / Self Care | Payer: Medicare Other | Source: Ambulatory Visit | Attending: Cardiology | Admitting: Cardiology

## 2014-08-04 DIAGNOSIS — I10 Essential (primary) hypertension: Secondary | ICD-10-CM | POA: Insufficient documentation

## 2014-08-04 DIAGNOSIS — I779 Disorder of arteries and arterioles, unspecified: Secondary | ICD-10-CM | POA: Insufficient documentation

## 2014-08-04 DIAGNOSIS — N184 Chronic kidney disease, stage 4 (severe): Secondary | ICD-10-CM

## 2014-08-04 DIAGNOSIS — N281 Cyst of kidney, acquired: Secondary | ICD-10-CM | POA: Insufficient documentation

## 2014-08-10 ENCOUNTER — Other Ambulatory Visit: Payer: Self-pay | Admitting: Cardiology

## 2014-08-10 NOTE — Telephone Encounter (Signed)
Rx has been sent to the pharmacy electronically. ° °

## 2014-08-12 ENCOUNTER — Ambulatory Visit (INDEPENDENT_AMBULATORY_CARE_PROVIDER_SITE_OTHER): Payer: Medicare Other | Admitting: Pharmacist Clinician (PhC)/ Clinical Pharmacy Specialist

## 2014-08-12 DIAGNOSIS — I513 Intracardiac thrombosis, not elsewhere classified: Secondary | ICD-10-CM

## 2014-08-12 DIAGNOSIS — I4891 Unspecified atrial fibrillation: Secondary | ICD-10-CM | POA: Diagnosis not present

## 2014-08-12 DIAGNOSIS — I5189 Other ill-defined heart diseases: Secondary | ICD-10-CM | POA: Diagnosis not present

## 2014-08-12 DIAGNOSIS — Z7901 Long term (current) use of anticoagulants: Secondary | ICD-10-CM | POA: Diagnosis not present

## 2014-08-12 LAB — POCT INR: INR: 3.2

## 2014-08-15 ENCOUNTER — Emergency Department (HOSPITAL_COMMUNITY): Payer: Medicare Other

## 2014-08-15 ENCOUNTER — Encounter (HOSPITAL_COMMUNITY): Payer: Self-pay | Admitting: Emergency Medicine

## 2014-08-15 ENCOUNTER — Inpatient Hospital Stay (HOSPITAL_COMMUNITY)
Admission: EM | Admit: 2014-08-15 | Discharge: 2014-08-17 | DRG: 291 | Disposition: A | Discharge status: Home / Self Care | Payer: Medicare Other | Attending: Internal Medicine | Admitting: Internal Medicine

## 2014-08-15 ENCOUNTER — Other Ambulatory Visit (HOSPITAL_COMMUNITY): Payer: Medicare Other

## 2014-08-15 DIAGNOSIS — I5043 Acute on chronic combined systolic (congestive) and diastolic (congestive) heart failure: Principal | ICD-10-CM

## 2014-08-15 DIAGNOSIS — I701 Atherosclerosis of renal artery: Secondary | ICD-10-CM

## 2014-08-15 DIAGNOSIS — I129 Hypertensive chronic kidney disease with stage 1 through stage 4 chronic kidney disease, or unspecified chronic kidney disease: Secondary | ICD-10-CM | POA: Diagnosis present

## 2014-08-15 DIAGNOSIS — E039 Hypothyroidism, unspecified: Secondary | ICD-10-CM | POA: Diagnosis present

## 2014-08-15 DIAGNOSIS — I2510 Atherosclerotic heart disease of native coronary artery without angina pectoris: Secondary | ICD-10-CM | POA: Diagnosis present

## 2014-08-15 DIAGNOSIS — Z9114 Patient's other noncompliance with medication regimen: Secondary | ICD-10-CM | POA: Diagnosis present

## 2014-08-15 DIAGNOSIS — I255 Ischemic cardiomyopathy: Secondary | ICD-10-CM | POA: Diagnosis present

## 2014-08-15 DIAGNOSIS — I248 Other forms of acute ischemic heart disease: Secondary | ICD-10-CM | POA: Diagnosis present

## 2014-08-15 DIAGNOSIS — E785 Hyperlipidemia, unspecified: Secondary | ICD-10-CM | POA: Diagnosis present

## 2014-08-15 DIAGNOSIS — Z8673 Personal history of transient ischemic attack (TIA), and cerebral infarction without residual deficits: Secondary | ICD-10-CM | POA: Diagnosis not present

## 2014-08-15 DIAGNOSIS — R7989 Other specified abnormal findings of blood chemistry: Secondary | ICD-10-CM | POA: Insufficient documentation

## 2014-08-15 DIAGNOSIS — Z79899 Other long term (current) drug therapy: Secondary | ICD-10-CM

## 2014-08-15 DIAGNOSIS — Z8249 Family history of ischemic heart disease and other diseases of the circulatory system: Secondary | ICD-10-CM | POA: Diagnosis not present

## 2014-08-15 DIAGNOSIS — Z955 Presence of coronary angioplasty implant and graft: Secondary | ICD-10-CM | POA: Diagnosis not present

## 2014-08-15 DIAGNOSIS — I10 Essential (primary) hypertension: Secondary | ICD-10-CM

## 2014-08-15 DIAGNOSIS — N183 Chronic kidney disease, stage 3 (moderate): Secondary | ICD-10-CM | POA: Diagnosis present

## 2014-08-15 DIAGNOSIS — J9601 Acute respiratory failure with hypoxia: Secondary | ICD-10-CM | POA: Diagnosis present

## 2014-08-15 DIAGNOSIS — R918 Other nonspecific abnormal finding of lung field: Secondary | ICD-10-CM

## 2014-08-15 DIAGNOSIS — E876 Hypokalemia: Secondary | ICD-10-CM | POA: Diagnosis present

## 2014-08-15 DIAGNOSIS — Z87891 Personal history of nicotine dependence: Secondary | ICD-10-CM | POA: Diagnosis not present

## 2014-08-15 DIAGNOSIS — Z7982 Long term (current) use of aspirin: Secondary | ICD-10-CM

## 2014-08-15 DIAGNOSIS — R0602 Shortness of breath: Secondary | ICD-10-CM | POA: Diagnosis present

## 2014-08-15 DIAGNOSIS — G9340 Encephalopathy, unspecified: Secondary | ICD-10-CM | POA: Diagnosis present

## 2014-08-15 DIAGNOSIS — E780 Pure hypercholesterolemia: Secondary | ICD-10-CM | POA: Diagnosis present

## 2014-08-15 DIAGNOSIS — I5021 Acute systolic (congestive) heart failure: Secondary | ICD-10-CM | POA: Diagnosis not present

## 2014-08-15 DIAGNOSIS — R778 Other specified abnormalities of plasma proteins: Secondary | ICD-10-CM | POA: Insufficient documentation

## 2014-08-15 DIAGNOSIS — Z7901 Long term (current) use of anticoagulants: Secondary | ICD-10-CM

## 2014-08-15 DIAGNOSIS — I480 Paroxysmal atrial fibrillation: Secondary | ICD-10-CM | POA: Diagnosis present

## 2014-08-15 DIAGNOSIS — I169 Hypertensive crisis, unspecified: Secondary | ICD-10-CM

## 2014-08-15 DIAGNOSIS — I509 Heart failure, unspecified: Secondary | ICD-10-CM

## 2014-08-15 DIAGNOSIS — I161 Hypertensive emergency: Secondary | ICD-10-CM | POA: Diagnosis present

## 2014-08-15 LAB — RAPID URINE DRUG SCREEN, HOSP PERFORMED
Amphetamines: NOT DETECTED
BARBITURATES: NOT DETECTED
Benzodiazepines: NOT DETECTED
COCAINE: NOT DETECTED
Opiates: NOT DETECTED
Tetrahydrocannabinol: NOT DETECTED

## 2014-08-15 LAB — I-STAT CHEM 8, ED
BUN: 37 mg/dL — ABNORMAL HIGH (ref 6–20)
CALCIUM ION: 1.11 mmol/L — AB (ref 1.13–1.30)
Chloride: 103 mmol/L (ref 101–111)
Creatinine, Ser: 1.7 mg/dL — ABNORMAL HIGH (ref 0.61–1.24)
Glucose, Bld: 114 mg/dL — ABNORMAL HIGH (ref 65–99)
HCT: 38 % — ABNORMAL LOW (ref 39.0–52.0)
HEMOGLOBIN: 12.9 g/dL — AB (ref 13.0–17.0)
Potassium: 4.7 mmol/L (ref 3.5–5.1)
Sodium: 137 mmol/L (ref 135–145)
TCO2: 23 mmol/L (ref 0–100)

## 2014-08-15 LAB — CBC
HCT: 36.6 % — ABNORMAL LOW (ref 39.0–52.0)
Hemoglobin: 11.8 g/dL — ABNORMAL LOW (ref 13.0–17.0)
MCH: 30 pg (ref 26.0–34.0)
MCHC: 32.2 g/dL (ref 30.0–36.0)
MCV: 93.1 fL (ref 78.0–100.0)
Platelets: 255 10*3/uL (ref 150–400)
RBC: 3.93 MIL/uL — ABNORMAL LOW (ref 4.22–5.81)
RDW: 14.5 % (ref 11.5–15.5)
WBC: 10.7 10*3/uL — ABNORMAL HIGH (ref 4.0–10.5)

## 2014-08-15 LAB — PROTIME-INR
INR: 3.84 — ABNORMAL HIGH (ref 0.00–1.49)
INR: 4.23 — ABNORMAL HIGH (ref 0.00–1.49)
PROTHROMBIN TIME: 36.8 s — AB (ref 11.6–15.2)
Prothrombin Time: 39.7 seconds — ABNORMAL HIGH (ref 11.6–15.2)

## 2014-08-15 LAB — TROPONIN I
TROPONIN I: 0.31 ng/mL — AB (ref <0.031)
Troponin I: 0.33 ng/mL — ABNORMAL HIGH (ref <0.031)

## 2014-08-15 LAB — BASIC METABOLIC PANEL
Anion gap: 10 (ref 5–15)
BUN: 34 mg/dL — ABNORMAL HIGH (ref 6–20)
CHLORIDE: 104 mmol/L (ref 101–111)
CO2: 24 mmol/L (ref 22–32)
Calcium: 8.9 mg/dL (ref 8.9–10.3)
Creatinine, Ser: 1.54 mg/dL — ABNORMAL HIGH (ref 0.61–1.24)
GFR calc Af Amer: 51 mL/min — ABNORMAL LOW (ref >60)
GFR calc non Af Amer: 44 mL/min — ABNORMAL LOW (ref >60)
Glucose, Bld: 114 mg/dL — ABNORMAL HIGH (ref 65–99)
Potassium: 4.8 mmol/L (ref 3.5–5.1)
Sodium: 138 mmol/L (ref 135–145)

## 2014-08-15 LAB — I-STAT TROPONIN, ED: TROPONIN I, POC: 0.13 ng/mL — AB (ref 0.00–0.08)

## 2014-08-15 LAB — BRAIN NATRIURETIC PEPTIDE: B NATRIURETIC PEPTIDE 5: 1783.2 pg/mL — AB (ref 0.0–100.0)

## 2014-08-15 LAB — MRSA PCR SCREENING: MRSA by PCR: NEGATIVE

## 2014-08-15 MED ORDER — FUROSEMIDE 10 MG/ML IJ SOLN
60.0000 mg | Freq: Two times a day (BID) | INTRAMUSCULAR | Status: DC
  Administered 2014-08-15 – 2014-08-16 (×3): 60 mg via INTRAVENOUS
  Filled 2014-08-15 (×3): qty 6

## 2014-08-15 MED ORDER — AMIODARONE HCL 200 MG PO TABS
200.0000 mg | ORAL_TABLET | Freq: Every day | ORAL | Status: DC
  Administered 2014-08-16 – 2014-08-17 (×2): 200 mg via ORAL
  Filled 2014-08-15 (×2): qty 1

## 2014-08-15 MED ORDER — FUROSEMIDE 10 MG/ML IJ SOLN
40.0000 mg | Freq: Once | INTRAMUSCULAR | Status: AC
  Administered 2014-08-15: 40 mg via INTRAVENOUS
  Filled 2014-08-15: qty 4

## 2014-08-15 MED ORDER — DOXAZOSIN MESYLATE 1 MG PO TABS
1.0000 mg | ORAL_TABLET | Freq: Every day | ORAL | Status: DC
  Administered 2014-08-15 – 2014-08-16 (×2): 1 mg via ORAL
  Filled 2014-08-15 (×2): qty 1

## 2014-08-15 MED ORDER — NITROGLYCERIN IN D5W 200-5 MCG/ML-% IV SOLN
0.0000 ug/min | INTRAVENOUS | Status: DC
  Administered 2014-08-15: 100 ug/min via INTRAVENOUS
  Administered 2014-08-15: 140 ug/min via INTRAVENOUS
  Administered 2014-08-15 – 2014-08-16 (×2): 170 ug/min via INTRAVENOUS
  Administered 2014-08-16: 80 ug/min via INTRAVENOUS
  Filled 2014-08-15 (×5): qty 250

## 2014-08-15 MED ORDER — HYDRALAZINE HCL 100 MG PO TABS
100.0000 mg | ORAL_TABLET | Freq: Three times a day (TID) | ORAL | Status: DC
  Administered 2014-08-15 – 2014-08-17 (×7): 100 mg via ORAL
  Filled 2014-08-15 (×10): qty 1

## 2014-08-15 MED ORDER — FUROSEMIDE 10 MG/ML IJ SOLN: 80.0000 mg | Freq: Once | INTRAMUSCULAR | Status: DC

## 2014-08-15 MED ORDER — FUROSEMIDE 8 MG/ML PO SOLN
40.0000 mg | Freq: Once | ORAL | Status: DC
  Filled 2014-08-15: qty 5

## 2014-08-15 MED ORDER — ACETAMINOPHEN 325 MG PO TABS: 650.0000 mg | ORAL_TABLET | Freq: Four times a day (QID) | ORAL | Status: DC | PRN

## 2014-08-15 MED ORDER — ROSUVASTATIN CALCIUM 20 MG PO TABS
20.0000 mg | ORAL_TABLET | Freq: Every day | ORAL | Status: DC
  Administered 2014-08-16 – 2014-08-17 (×2): 20 mg via ORAL
  Filled 2014-08-15 (×2): qty 1

## 2014-08-15 MED ORDER — NITROGLYCERIN IN D5W 200-5 MCG/ML-% IV SOLN
0.0000 ug/min | Freq: Once | INTRAVENOUS | Status: AC
  Administered 2014-08-15: 5 ug/min via INTRAVENOUS
  Filled 2014-08-15: qty 250

## 2014-08-15 MED ORDER — CARVEDILOL 12.5 MG PO TABS
12.5000 mg | ORAL_TABLET | Freq: Two times a day (BID) | ORAL | Status: DC
Start: 2014-08-15 — End: 2014-08-16
  Administered 2014-08-15 – 2014-08-16 (×2): 12.5 mg via ORAL
  Filled 2014-08-15 (×2): qty 1

## 2014-08-15 MED ORDER — HYDRALAZINE HCL 20 MG/ML IJ SOLN
5.0000 mg | Freq: Once | INTRAMUSCULAR | Status: AC
  Administered 2014-08-15: 5 mg via INTRAVENOUS
  Filled 2014-08-15: qty 1

## 2014-08-15 MED ORDER — LEVOTHYROXINE SODIUM 50 MCG PO TABS
50.0000 ug | ORAL_TABLET | Freq: Every day | ORAL | Status: DC
  Administered 2014-08-16 – 2014-08-17 (×2): 50 ug via ORAL
  Filled 2014-08-15 (×2): qty 1

## 2014-08-15 MED ORDER — ASPIRIN 81 MG PO CHEW
324.0000 mg | CHEWABLE_TABLET | Freq: Once | ORAL | Status: AC
  Administered 2014-08-15: 324 mg via ORAL
  Filled 2014-08-15: qty 4

## 2014-08-15 MED ORDER — FUROSEMIDE 10 MG/ML IJ SOLN
80.0000 mg | Freq: Once | INTRAMUSCULAR | Status: AC
  Administered 2014-08-15: 80 mg via INTRAVENOUS
  Filled 2014-08-15: qty 8

## 2014-08-15 MED ORDER — ASPIRIN 81 MG PO CHEW
81.0000 mg | CHEWABLE_TABLET | Freq: Every day | ORAL | Status: DC
  Administered 2014-08-16 – 2014-08-17 (×2): 81 mg via ORAL
  Filled 2014-08-15 (×2): qty 1

## 2014-08-15 MED ORDER — ONDANSETRON HCL 4 MG/2ML IJ SOLN: 4.0000 mg | Freq: Four times a day (QID) | INTRAMUSCULAR | Status: DC | PRN

## 2014-08-15 MED ORDER — WARFARIN - PHARMACIST DOSING INPATIENT: Freq: Every day | Status: DC

## 2014-08-15 MED ORDER — ASPIRIN 81 MG PO CHEW: 81.0000 mg | CHEWABLE_TABLET | Freq: Every day | ORAL | Status: DC

## 2014-08-15 NOTE — H&P (Signed)
PULMONARY / CRITICAL CARE MEDICINE   Name: Erik Adams MRN: [id number] DOB: 08/06/1945    ADMISSION DATE:  08/15/2014  REFERRING MD :  ER  CHIEF COMPLAINT:  Short of breath  BRIEF DESCRIPTION: 69 yo male with remote hx of smoking presented with progressive dyspnea from acute pulmonary edema and HTN emergency (BP 206/115).  He has hx of CAD s/p stent, HTN, combined CHF (EF 35 to 40% from 12/06/13), A fib, CKD, CVA.  STUDIES:   SIGNIFICANT EVENTS: 7/04 Admit   HISTORY OF PRESENT ILLNESS:   69 yo male presented with progressive dyspnea, chest pain, and confusion.  He took his medications and then came to the ER.  He was noted to be in respirator failure with oxygen saturation in 80's, cyanotic, and confused.  He was stared on oxygen/bipap.  His BP was 206/115.  He was started on NTG gtt and given lasix.  His breathing, oxygenation, chest pain, and mentation all improved.  PAST MEDICAL HISTORY :   has a past medical history of CAD (coronary artery disease); HTN (hypertension); Hypercholesteremia; CVA (cerebral infarction); CRI (chronic renal insufficiency); Combined systolic and diastolic heart failure (Sept 2012); Atrial fibrillation (11/13/2011); Left atrial thrombus; Iron deficiency anemia; and Cardiomyopathy, ischemic.  has past surgical history that includes Coronary angioplasty with stent (2000); Mastoidectomy; Coronary angioplasty with stent (March 2012); TEE without cardioversion (12/06/2011); Cardioversion (01/06/2012); Cardioversion (02/13/2012); Tonsillectomy; Cholecystectomy (1999); LEFT INGUINAL HERNIA REPAIR (02/2007); Hernia repair; and Coronary angioplasty with stent (04/2000). Prior to Admission medications   Medication Sig Start Date End Date Taking? Authorizing Provider  amiodarone (PACERONE) 200 MG tablet TAKE 1 TABLET DAILY. 07/01/14   Lennette Bihari, MD  aspirin 81 MG chewable tablet Chew 1 tablet (81 mg total) by mouth daily. 03/03/12   Simonne Martinet, NP  carvedilol  (COREG) 25 MG tablet TAKE 1 TABLET TWICE DAILY WITH FOOD. 08/10/14   Peter M [place], MD  diltiazem (CARDIZEM CD) 120 MG 24 hr capsule Take 1 capsule (120 mg total) by mouth daily. 07/05/14   Lennette Bihari, MD  doxazosin (CARDURA) 1 MG tablet Take 1 tablet (1 mg total) by mouth at bedtime. 01/04/14   Lennette Bihari, MD  furosemide (LASIX) 40 MG tablet Take 1 tablet (40 mg total) by mouth daily. 04/12/14   Lennette Bihari, MD  hydrALAZINE (APRESOLINE) 100 MG tablet Take 1 tablet (100 mg total) by mouth 3 (three) times daily. 07/05/14   Lennette Bihari, MD  isosorbide mononitrate (IMDUR) 60 MG 24 hr tablet TAKE 1 TABLET TWICE DAILY. 07/01/14   Lennette Bihari, MD  levothyroxine (SYNTHROID, LEVOTHROID) 50 MCG tablet Take 1 tablet (50 mcg total) by mouth daily before breakfast. 05/05/14   Abelino Derrick, PA-C  nitroGLYCERIN (NITROSTAT) 0.4 MG SL tablet Place 1 tablet (0.4 mg total) under the tongue every 5 (five) minutes x 3 doses as needed. For chest pain 08/12/12   Lennette Bihari, MD  potassium chloride SA (K-DUR,KLOR-CON) 20 MEQ tablet Take 1 tablet (20 mEq total) by mouth daily. 04/12/14   Lennette Bihari, MD  rosuvastatin (CRESTOR) 20 MG tablet Take 1 tablet (20 mg total) by mouth daily. 02/02/14   Lennette Bihari, MD  warfarin (COUMADIN) 5 MG tablet Take 1 tablet by mouth daily or as directed 05/19/13   Rosalee Kaufman, RPH-CPP   Allergies  Allergen Reactions  . Amlodipine Shortness Of Breath, Swelling and Palpitations    "Caused  Heart condition-per patient"  .  Ace Inhibitors Other (See Comments)    Unknown reaction  . Clonidine Derivatives Other (See Comments)    Unknown reaction    FAMILY HISTORY:  indicated that his mother is deceased. He indicated that his father is deceased.  SOCIAL HISTORY:  reports that he quit smoking about 40 years ago. His smoking use included Cigarettes. He does not have any smokeless tobacco history on file. He reports that he does not drink alcohol or use illicit  drugs.  REVIEW OF SYSTEMS:   Negative except above  SUBJECTIVE:   VITAL SIGNS: Pulse Rate:  [74-90] 74 (07/04 0730) Resp:  [20-31] 21 (07/04 0730) BP: (178-206)/(95-115) 178/95 mmHg (07/04 0730) SpO2:  [87 %-100 %] 100 % (07/04 0730) Weight:  [165 lb (74.844 kg)] 165 lb (74.844 kg) (07/04 0612) INTAKE / OUTPUT: No intake or output data in the 24 hours ending 08/15/14 0748  PHYSICAL EXAMINATION: General:  Seen in ER, pleasant Neuro:  Alert, normal strength, CN intact HEENT:  Pupils reactive, no LAN Cardiovascular: regular Lungs:  Basilar crackles, no wheeze Abdomen:  Soft, non tender Musculoskeletal:  No edema Skin:  No rashes  LABS:  CBC  Recent Labs Lab 08/15/14 0628 08/15/14 0632  WBC 10.7*  --   HGB 11.8* 12.9*  HCT 36.6* 38.0*  PLT 255  --    Coag's  Recent Labs Lab 08/12/14 1516 08/15/14 0628  INR 3.2 3.84*   BMET  Recent Labs Lab 08/15/14 0628 08/15/14 0632  NA 138 137  K 4.8 4.7  CL 104 103  CO2 24  --   BUN 34* 37*  CREATININE 1.54* 1.70*  GLUCOSE 114* 114*   Electrolytes  Recent Labs Lab 08/15/14 0628  CALCIUM 8.9   Cardiac Enzymes No results for input(s): TROPONINI, PROBNP in the last 168 hours.  Imaging Dg Chest Port 1 View  08/15/2014   CLINICAL DATA:  Shortness of breath  EXAM: PORTABLE CHEST - 1 VIEW  COMPARISON:  02/29/2012  FINDINGS: There is chronic cardiomegaly. Coronary stents are noted. Stable and negative aortic and hilar contours for technique. Diffuse aortic atherosclerosis is present.  Diffuse interstitial opacity with Kerley lines. No evidence for pneumonia. No significant pleural effusion.  IMPRESSION: CHF.   Electronically Signed   By: Marnee Spring M.D.   On: 08/15/2014 06:43     ASSESSMENT / PLAN:  PULMONARY A: Acute hypoxic respiratory failure 2nd to acute pulmonary edema. P:   Oxygen to keep SpO2 90 to 95% BiPAP prn F/u CXR  CARDIOVASCULAR A:  Acute on chronic combined heart failure. HTN  emergency. Hx of A fib, HLD, CAD s/p stent. P:  IV NTG gtt IV lasix Continue ASA, Amiodarone, hydralazine Hold coreg, cardizem, imdur Coumadin per pharmacy Continue crestor F/u troponin, BNP Cardiology consulted  RENAL A:   CKD stage 3. P:   Cardura Monitor renal fx, urine outpt, electrolytes  GASTROINTESTINAL A:   Nutrition. P:   Heart healthy diet  HEMATOLOGIC A:   No acute issues. P:  F/u CBC  INFECTIOUS A:   No evidence for infection. P:   Monitor clinically  ENDOCRINE A:   Hx of hypothyroidism. P:   Continue synthroid  NEUROLOGIC A:   Acute encephalopathy 2nd to hypoxia, and HTN emergency >> improved with Tx. P:   Monitor mental status  CC time 40 minutes.  Coralyn Helling, MD [facility] Pulmonary/Critical Care 08/15/2014, 8:20 AM Pager:  [phone] After 3pm call: [phone]

## 2014-08-15 NOTE — Plan of Care (Signed)
Problem: Phase I Progression Outcomes Goal: Voiding-avoid urinary catheter unless indicated Outcome: Not Applicable Date Met:  94/76/54 Foley placed due to aggressive diuresis/ measurement.  Pt. diuresing, no SOB noted. Goal: Hemodynamically stable Outcome: Progressing Pt. Remains on Nitroglycerin gtt. to keep SBP <170.  PO meds added as ordered, pt reports that he has had HTN since he was 69 years old and has been on medicine for a long time.  Pt reports that his MD did recently decrease his Lasix dose and that he has been compliant in taking his meds on time.

## 2014-08-15 NOTE — Progress Notes (Signed)
Contacted regarding patient presenting to ER with SOB. By ER physician report hypertensive emergency in setting of medication noncompliance with pulmonary edema. Chronic systolic HF LVEF 24-46%. Reportedly maxed out on NG drip and still hypertensive, on Bipap. I have recommended medicine/ICU admission for management of hypertensive emergency, we will see in full consult. Given low LVEF if additional bp agent needed would avoid labetaolol and dilt, can consider nicardapine drip. Goal would be to drop MAP 25% over the next 24 hours, avoid over aggressive lowering of BP. Agree with IV diuretics. Full consult to follow.   Dominga Ferry MD

## 2014-08-15 NOTE — Progress Notes (Signed)
ANTICOAGULATION CONSULT NOTE - Initial Consult  Pharmacy Consult for Warfarin Indication: atrial fibrillation  Allergies  Allergen Reactions  . Amlodipine Shortness Of Breath, Swelling and Palpitations    "Caused  Heart condition-per patient"  . Ace Inhibitors Other (See Comments)    Unknown reaction  . Clonidine Derivatives Other (See Comments)    Unknown reaction    Patient Measurements: Height: 5\' 10"  (177.8 cm) Weight: 165 lb (74.844 kg) IBW/kg (Calculated) : 73   Vital Signs: BP: 168/91 mmHg (07/04 0815) Pulse Rate: 70 (07/04 0815)  Labs:  Recent Labs  08/12/14 1516 08/15/14 0628 08/15/14 0632  HGB  --  11.8* 12.9*  HCT  --  36.6* 38.0*  PLT  --  255  --   LABPROT  --  36.8*  --   INR 3.2 3.84*  --   CREATININE  --  1.54* 1.70*    Estimated Creatinine Clearance: 42.3 mL/min (by C-G formula based on Cr of 1.7).   Medical History: Past Medical History  Diagnosis Date  . CAD (coronary artery disease)     myoview 03/02/12-EF 32%, mod fixed defect in the inf wall without significant ischemia; a. s/p BMS to mLAD 2000;  b. LHC 3/12:  sig ISR mLAD, RI 80-90%, Dx1 90% (too small for PCI);  PCI:  ION DES to mLAD, ION DES to RI  . HTN (hypertension)   . Hypercholesteremia   . CVA (cerebral infarction)   . CRI (chronic renal insufficiency)     Baseline creatinine of 1.4 to 1.7  . 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 Atrial fibrillation 11/13/2011    coumadin and amiodarone; s/p DCCV November 2013; repeat DCCV Feb 13, 2012  . Left atrial thrombus     TEE 11/2011  . Iron deficiency anemia   . Cardiomyopathy, ischemic     echo 02/28/12-Ef 25%, mod mr, mild-mod TR, mod pul htn; refuses ICD    Medications:  Scheduled:  . amiodarone  200 mg Oral Daily  . aspirin  81 mg Oral Daily  . doxazosin  1 mg Oral QHS  . furosemide  60 mg Intravenous Q12H  . hydrALAZINE  100 mg Oral TID  . levothyroxine  50 mcg Oral QAC breakfast  . rosuvastatin  20 mg  Oral Daily   Infusions:  . nitroGLYCERIN     PRN: acetaminophen, ondansetron (ZOFRAN) IV  Assessment: 68yoM with PMH of CAD s/p stent, HTN, CHF, CKD, CVA , Afib and presenting to the ER with progressive dyspnea from acute pulmonary edema and HTN emergency. He was on Warfarin PTA for AFib. Pt is unsure of his regimen but per Anticoag clinic note on 7/1, pt was on 5mg  daily except 2.5mg  on Mon and Fri.  Goal of Therapy:  INR 2-3   Plan:  INR is supratherapeutic so will hold Coumadin today. Daily PT/INR     Dorethea Clan, PharmD 08/15/2014  08/15/2014,8:25 AM

## 2014-08-15 NOTE — Progress Notes (Signed)
eLink Physician-Brief Progress Note Patient Name: Erik Adams DOB: 1945/09/02 MRN: [id number]   Date of Service  08/15/2014  HPI/Events of Note  Rn reporting pinkish urine with occ clots in foley. Patient on lasix and needs foley per RN   Recent Labs Lab 08/12/14 1516 08/15/14 0628 08/15/14 1220  INR 3.2 3.84* 4.23*   High INR due to coumadin  Camera  - looks well, talking to someone  - VSS stable - no distgress  eICU Interventions  Monitor clincially     Intervention Category Intermediate Interventions: Bleeding - evaluation and treatment with blood products  [name] 08/15/2014, 8:36 PM

## 2014-08-15 NOTE — ED Notes (Signed)
Per Dr Craige Cotta, BIPAP discontinued, pt was given ice water, he reports he feels a lot better, respirations are equal and unlabored, sat 100% on RA. Will continue to monitor pt closely.

## 2014-08-15 NOTE — Progress Notes (Signed)
Utilization review completed.  

## 2014-08-15 NOTE — ED Provider Notes (Addendum)
CSN: [id number]     Arrival date & time 08/15/14  0605 History   First MD Initiated Contact with Patient 08/15/14 [phone]     Chief Complaint  Patient presents with  . Shortness of Breath     (Consider location/radiation/quality/duration/timing/severity/associated sxs/prior Treatment) Patient is a 69 y.o. male presenting with shortness of breath. The history is provided by the patient. The history is limited by the condition of the patient. No language interpreter was used.  Shortness of Breath Severity:  Severe Onset quality:  Sudden Duration:  1 day Timing:  Constant Progression:  Unchanged Chronicity:  New Context: not URI   Relieved by:  Nothing Worsened by:  Nothing tried Ineffective treatments:  None tried Associated symptoms: chest pain   Associated symptoms: no abdominal pain, no cough, no diaphoresis, no syncope and no wheezing   Risk factors: no prolonged immobilization     Past Medical History  Diagnosis Date  . CAD (coronary artery disease)     myoview 03/02/12-EF 32%, mod fixed defect in the inf wall without significant ischemia; a. s/p BMS to mLAD 2000;  b. LHC 3/12:  sig ISR mLAD, RI 80-90%, Dx1 90% (too small for PCI);  PCI:  ION DES to mLAD, ION DES to RI  . HTN (hypertension)   . Hypercholesteremia   . CVA (cerebral infarction)   . CRI (chronic renal insufficiency)     Baseline creatinine of 1.4 to 1.7  . 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 Atrial fibrillation 11/13/2011    coumadin and amiodarone; s/p DCCV November 2013; repeat DCCV Feb 13, 2012  . Left atrial thrombus     TEE 11/2011  . Iron deficiency anemia   . Cardiomyopathy, ischemic     echo 02/28/12-Ef 25%, mod mr, mild-mod TR, mod pul htn; refuses ICD   Past Surgical History  Procedure Laterality Date  . Coronary angioplasty with stent placement  2000    BMS to the LAD  . Mastoidectomy    . Coronary angioplasty with stent placement  March 2012    DES to LAD and Ramus  intermdius  . Tee without cardioversion  12/06/2011    Procedure: TRANSESOPHAGEAL ECHOCARDIOGRAM (TEE);  Surgeon: Pricilla Riffle, MD;  Location: [facility] ENDOSCOPY;  Service: Cardiovascular;  Laterality: N/A;  . Cardioversion  01/06/2012    Procedure: CARDIOVERSION;  Surgeon: Wendall Stade, MD;  Location: [facility] ENDOSCOPY;  Service: Cardiovascular;  Laterality: N/A;  call anesthesia  maybe able to do cardio. early at 12:00  . Cardioversion  02/13/2012    Procedure: CARDIOVERSION;  Surgeon: Cassell Clement, MD;  Location: [facility] ENDOSCOPY;  Service: Cardiovascular;  Laterality: N/A;  . Tonsillectomy    . Cholecystectomy  1999  . Left inguinal hernia repair  02/2007  . Hernia repair    . Coronary angioplasty with stent placement  04/2000    DES to LAD and ramus intermediate vessel   Family History  Problem Relation Age of Onset  . Heart disease Father    History  Substance Use Topics  . Smoking status: Former Smoker    Types: Cigarettes    Quit date: 02/11/1974  . Smokeless tobacco: Not on file  . Alcohol Use: No    Review of Systems  Constitutional: Negative for diaphoresis.  Respiratory: Positive for shortness of breath. Negative for cough and wheezing.   Cardiovascular: Positive for chest pain. Negative for palpitations, leg swelling and syncope.  Gastrointestinal: Negative for abdominal pain.  All other systems reviewed  and are negative.     Allergies  Amlodipine; Ace inhibitors; and Clonidine derivatives  Home Medications   Prior to Admission medications   Medication Sig Start Date End Date Taking? Authorizing Provider  amiodarone (PACERONE) 200 MG tablet TAKE 1 TABLET DAILY. 07/01/14   Lennette Bihari, MD  aspirin 81 MG chewable tablet Chew 1 tablet (81 mg total) by mouth daily. 03/03/12   Simonne Martinet, NP  carvedilol (COREG) 25 MG tablet TAKE 1 TABLET TWICE DAILY WITH FOOD. 08/10/14   Peter M [place], MD  diltiazem (CARDIZEM CD) 120 MG 24 hr capsule Take 1 capsule (120 mg total) by  mouth daily. 07/05/14   Lennette Bihari, MD  doxazosin (CARDURA) 1 MG tablet Take 1 tablet (1 mg total) by mouth at bedtime. 01/04/14   Lennette Bihari, MD  furosemide (LASIX) 40 MG tablet Take 1 tablet (40 mg total) by mouth daily. 04/12/14   Lennette Bihari, MD  hydrALAZINE (APRESOLINE) 100 MG tablet Take 1 tablet (100 mg total) by mouth 3 (three) times daily. 07/05/14   Lennette Bihari, MD  isosorbide mononitrate (IMDUR) 60 MG 24 hr tablet TAKE 1 TABLET TWICE DAILY. 07/01/14   Lennette Bihari, MD  levothyroxine (SYNTHROID, LEVOTHROID) 50 MCG tablet Take 1 tablet (50 mcg total) by mouth daily before breakfast. 05/05/14   Abelino Derrick, PA-C  nitroGLYCERIN (NITROSTAT) 0.4 MG SL tablet Place 1 tablet (0.4 mg total) under the tongue every 5 (five) minutes x 3 doses as needed. For chest pain 08/12/12   Lennette Bihari, MD  potassium chloride SA (K-DUR,KLOR-CON) 20 MEQ tablet Take 1 tablet (20 mEq total) by mouth daily. 04/12/14   Lennette Bihari, MD  rosuvastatin (CRESTOR) 20 MG tablet Take 1 tablet (20 mg total) by mouth daily. 02/02/14   Lennette Bihari, MD  warfarin (COUMADIN) 5 MG tablet Take 1 tablet by mouth daily or as directed 05/19/13   Belenda Cruise L [place], RPH-CPP   BP 178/95 mmHg  Pulse 74  Resp 21  Ht 5\' 10"  (1.778 m)  Wt 165 lb (74.844 kg)  BMI 23.68 kg/m2  SpO2 100% Physical Exam  Constitutional: He is oriented to person, place, and time. He appears well-developed and well-nourished. He appears distressed.  HENT:  Head: Normocephalic and atraumatic.  Mouth/Throat: Oropharynx is clear and moist. No oropharyngeal exudate.  Eyes: Conjunctivae and EOM are normal. Pupils are equal, round, and reactive to light.  Neck: Normal range of motion. Neck supple.  Cardiovascular: Normal rate, regular rhythm and intact distal pulses.   Pulmonary/Chest: No stridor. He is in respiratory distress. He has no wheezes. He has rales.  Abdominal: Soft. Bowel sounds are normal. There is no tenderness. There is no rebound  and no guarding.  Musculoskeletal: Normal range of motion. He exhibits no edema.  Neurological: He is alert and oriented to person, place, and time.  Skin: Skin is warm and dry. He is not diaphoretic.  Psychiatric: He has a normal mood and affect.    ED Course  Procedures (including critical care time) Labs Review Labs Reviewed  CBC - Abnormal; Notable for the following:    WBC 10.7 (*)    RBC 3.93 (*)    Hemoglobin 11.8 (*)    HCT 36.6 (*)    All other components within normal limits  BASIC METABOLIC PANEL - Abnormal; Notable for the following:    Glucose, Bld 114 (*)    BUN 34 (*)    Creatinine,  Ser 1.54 (*)    GFR calc non Af Amer 44 (*)    GFR calc Af Amer 51 (*)    All other components within normal limits  PROTIME-INR - Abnormal; Notable for the following:    Prothrombin Time 36.8 (*)    INR 3.84 (*)    All other components within normal limits  I-STAT CHEM 8, ED - Abnormal; Notable for the following:    BUN 37 (*)    Creatinine, Ser 1.70 (*)    Glucose, Bld 114 (*)    Calcium, Ion 1.11 (*)    Hemoglobin 12.9 (*)    HCT 38.0 (*)    All other components within normal limits  PROTIME-INR  BRAIN NATRIURETIC PEPTIDE  I-STAT TROPOININ, ED  Rosezena Sensor, ED    Imaging Review Dg Chest Port 1 View  08/15/2014   CLINICAL DATA:  Shortness of breath  EXAM: PORTABLE CHEST - 1 VIEW  COMPARISON:  02/29/2012  FINDINGS: There is chronic cardiomegaly. Coronary stents are noted. Stable and negative aortic and hilar contours for technique. Diffuse aortic atherosclerosis is present.  Diffuse interstitial opacity with Kerley lines. No evidence for pneumonia. No significant pleural effusion.  IMPRESSION: CHF.   Electronically Signed   By: Marnee Spring M.D.   On: 08/15/2014 06:43     EKG Interpretation   Date/Time:  Monday August 15 2014 06:14:13 EDT Ventricular Rate:  89 PR Interval:  228 QRS Duration: 143 QT Interval:  433 QTC Calculation: 527 R Axis:   -22 Text  Interpretation:  Sinus rhythm Prolonged PR interval Probable left  atrial enlargement Left bundle branch block Confirmed by [facility]   MD, Wagner Tanzi ([id number]) on 08/15/2014 6:17:26 AM      MDM   Final diagnoses:  Acute systolic congestive heart failure  Hypertensive crisis    Results for orders placed or performed during the hospital encounter of 08/15/14  CBC  Result Value Ref Range   WBC 10.7 (H) 4.0 - 10.5 K/uL   RBC 3.93 (L) 4.22 - 5.81 MIL/uL   Hemoglobin 11.8 (L) 13.0 - 17.0 g/dL   HCT 60.4 (L) 54.0 - 98.1 %   MCV 93.1 78.0 - 100.0 fL   MCH 30.0 26.0 - 34.0 pg   MCHC 32.2 30.0 - 36.0 g/dL   RDW 19.1 47.8 - 29.5 %   Platelets 255 150 - 400 K/uL  Basic metabolic panel  Result Value Ref Range   Sodium 138 135 - 145 mmol/L   Potassium 4.8 3.5 - 5.1 mmol/L   Chloride 104 101 - 111 mmol/L   CO2 24 22 - 32 mmol/L   Glucose, Bld 114 (H) 65 - 99 mg/dL   BUN 34 (H) 6 - 20 mg/dL   Creatinine, Ser 6.21 (H) 0.61 - 1.24 mg/dL   Calcium 8.9 8.9 - 30.8 mg/dL   GFR calc non Af Amer 44 (L) >60 mL/min   GFR calc Af Amer 51 (L) >60 mL/min   Anion gap 10 5 - 15  Protime-INR (if pt is taking Coumadin)  Result Value Ref Range   Prothrombin Time 36.8 (H) 11.6 - 15.2 seconds   INR 3.84 (H) 0.00 - 1.49  I-stat chem 8, ed  Result Value Ref Range   Sodium 137 135 - 145 mmol/L   Potassium 4.7 3.5 - 5.1 mmol/L   Chloride 103 101 - 111 mmol/L   BUN 37 (H) 6 - 20 mg/dL   Creatinine, Ser 6.57 (H) 0.61 - 1.24 mg/dL  Glucose, Bld 114 (H) 65 - 99 mg/dL   Calcium, Ion 9.41 (L) 1.13 - 1.30 mmol/L   TCO2 23 0 - 100 mmol/L   Hemoglobin 12.9 (L) 13.0 - 17.0 g/dL   HCT 74.0 (L) 81.4 - 48.1 %   Dg Chest Port 1 View  08/15/2014   CLINICAL DATA:  Shortness of breath  EXAM: PORTABLE CHEST - 1 VIEW  COMPARISON:  02/29/2012  FINDINGS: There is chronic cardiomegaly. Coronary stents are noted. Stable and negative aortic and hilar contours for technique. Diffuse aortic atherosclerosis is present.  Diffuse  interstitial opacity with Kerley lines. No evidence for pneumonia. No significant pleural effusion.  IMPRESSION: CHF.   Electronically Signed   By: Marnee Spring M.D.   On: 08/15/2014 06:43   MDM Acute systolic congestive heart failure:  Hypertensive crisis:  Critical Care Total time providing critical care: 75-105 minutes MDM Reviewed: previous chart, nursing note and vitals Reviewed previous: labs Interpretation: labs, x-ray and ECG (first troponin elevated cxr pulmonary edema by me INR therapeutic) Total time providing critical care: 75-105 minutes. This excludes time spent performing separately reportable procedures and services. Consults: pulmonary and cardiology (D/w Dr. Allyson Sabal of Cardiology and Dr. Tyson Alias of PCCM)  BIPAP initiated immediately   Medications  aspirin chewable tablet 324 mg (324 mg Oral Given 08/15/14 8563)  nitroGLYCERIN 50 mg in dextrose 5 % 250 mL (0.2 mg/mL) infusion (60 mcg/min Intravenous Rate/Dose Change 08/15/14 0742)  furosemide (LASIX) injection 80 mg (80 mg Intravenous Given 08/15/14 0647)  hydrALAZINE (APRESOLINE) injection 5 mg (5 mg Intravenous Given 08/15/14 0708)  furosemide (LASIX) injection 40 mg (40 mg Intravenous Given 08/15/14 0740)   CRITICAL CARE Performed by: Jasmine Awe Total critical care time: 90 minutes Critical care time was exclusive of separately billable procedures and treating other patients. Critical care was necessary to treat or prevent imminent or life-threatening deterioration. Critical care was time spent personally by me on the following activities: development of treatment plan with patient and/or surrogate as well as nursing, discussions with consultants, evaluation of patient's response to treatment, examination of patient, obtaining history from patient or surrogate, ordering and performing treatments and interventions, ordering and review of laboratory studies, ordering and review of radiographic studies, pulse oximetry  and re-evaluation of patient's condition.      Cy Blamer, MD 08/15/14 0754  Anakaren Campion, MD 08/15/14 [phone]

## 2014-08-15 NOTE — ED Notes (Signed)
Pt presents with c/o shob onset last evening, pt speaking in short sentences, pt unable to give clear hx on chest pain.

## 2014-08-15 NOTE — Consult Note (Addendum)
Name: Erik Adams is a 69 y.o. male Admit date: 08/15/2014 Referring Physician:  Craige Cotta, MD Primary Physician:  Nicki Guadalajara, MD Primary Cardiologist:  Same  Reason for Consultation:  Acute heart failure  ASSESSMENT: 1. Hypertensive emergency, related to #2 2. Recently documented bilateral renal artery stenosis with occlusion of right renal artery stenosis and moderate stenosis in the left renal artery 3. Acute on chronic combined systolic and diastolic heart failure secondary to #1 and #2 4. History of paroxysmal atrial fibrillation, currently with rhythm control on amiodarone therapy 5. Coronary artery disease with prior LAD and ramus intermedius stents, 2012 6. Left bundle branch block 7. Prior history of left atrial thrombus 8. Chronic kidney disease, stage III 9. Elevated troponin likely related to demand  PLAN: 1. Patient needs more aggressive diuretic therapy 2. Resume beta blocker therapy to avoid rebound tachycardia and perhaps recurrent atrial fibrillation 3. Will need eventual angiography of renal arteries to determine if intervention on the right renal artery plus minus left renal artery would be helpful in controlling blood pressure. I believe referral has already been made to Dr. Allyson Sabal. 4. Management strategy for hypertension will include hydralazine, beta blocker therapy, long-acting nitrates, and aggressive diuretic therapy. Agree with current IV dose. As an outpatient, he was on 40 mg of furosemide daily and will likely need at least double that dose. Most likely, some compromise in kidney function will occur with volume contraction. 5. 2-D Doppler echocardiogram 6. Resume carvedilol therapy, will start at 12.5 mg twice a day Rezulin 25 mg twice a day but we will need to titrate back to optimal doses. 7. Depending upon LV function, calcium channel blocker therapy may be considered, although recent addition of diltiazem could have some role in acute decompensation. He  has been unable previously to tolerate dihydropyridine therapy due to peripheral edema.   HPI: 69 year old gentleman admitted with respiratory distress associated hypertensive urgency. He denies any concomitant chest discomfort. He advocates compliance with outpatient medical regimen. He also states that he has continued his diuretic therapy without missing doses.  Recent renal duplex demonstrated occlusion of the right renal artery and moderate stenosis in the left renal artery. Arrangements have already been made for the patient to see Dr. Nanetta Batty for further evaluation.  PMH:   Past Medical History  Diagnosis Date  . CAD (coronary artery disease)     myoview 03/02/12-EF 32%, mod fixed defect in the inf wall without significant ischemia; a. s/p BMS to mLAD 2000;  b. LHC 3/12:  sig ISR mLAD, RI 80-90%, Dx1 90% (too small for PCI);  PCI:  ION DES to mLAD, ION DES to RI  . HTN (hypertension)   . Hypercholesteremia   . CVA (cerebral infarction)   . CRI (chronic renal insufficiency)     Baseline creatinine of 1.4 to 1.7  . 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 Atrial fibrillation 11/13/2011    coumadin and amiodarone; s/p DCCV November 2013; repeat DCCV Feb 13, 2012  . Left atrial thrombus     TEE 11/2011  . Iron deficiency anemia   . Cardiomyopathy, ischemic     echo 02/28/12-Ef 25%, mod mr, mild-mod TR, mod pul htn; refuses ICD  . CHF (congestive heart failure)     PSH:   Past Surgical History  Procedure Laterality Date  . Coronary angioplasty with stent placement  2000    BMS to the LAD  . Mastoidectomy    . Coronary angioplasty with  stent placement  March 2012    DES to LAD and Ramus intermdius  . Tee without cardioversion  12/06/2011    Procedure: TRANSESOPHAGEAL ECHOCARDIOGRAM (TEE);  Surgeon: Pricilla Riffle, MD;  Location: [facility] ENDOSCOPY;  Service: Cardiovascular;  Laterality: N/A;  . Cardioversion  01/06/2012    Procedure: CARDIOVERSION;  Surgeon: Wendall Stade, MD;  Location: [facility] ENDOSCOPY;  Service: Cardiovascular;  Laterality: N/A;  call anesthesia  maybe able to do cardio. early at 12:00  . Cardioversion  02/13/2012    Procedure: CARDIOVERSION;  Surgeon: Cassell Clement, MD;  Location: [facility] ENDOSCOPY;  Service: Cardiovascular;  Laterality: N/A;  . Tonsillectomy    . Cholecystectomy  1999  . Left inguinal hernia repair  02/2007  . Hernia repair    . Coronary angioplasty with stent placement  04/2000    DES to LAD and ramus intermediate vessel   Allergies:  Amlodipine; Ace inhibitors; and Clonidine derivatives Prior to Admit Meds:   (Not in a hospital admission) Fam HX:    Family History  Problem Relation Age of Onset  . Heart disease Father    Social HX:    History   Social History  . Marital Status: Widowed    Spouse Name: N/A  . Number of Children: 5  . Years of Education: N/A   Occupational History  . insurance     Social History Main Topics  . Smoking status: Former Smoker    Types: Cigarettes    Quit date: 02/11/1974  . Smokeless tobacco: Not on file  . Alcohol Use: No  . Drug Use: No  . Sexual Activity: Not Currently   Other Topics Concern  . Not on file   Social History Narrative     Review of Systems: Has had significant swelling of lower extremities related to amlodipine therapy. He denies alcohol and tobacco use. Denies alcohol use. No palpitations.  Physical Exam: Blood pressure 168/86, pulse 71, temperature 98.3 F (36.8 C), temperature source Oral, resp. rate 17, height  (1.778 m), weight 74.844 kg (165 lb), SpO2 96 %. Weight change:    Skin is warm and dry. Respirations are not labored. He is comfortable on the hospital gurney. HEENT exam reveals no jaundice or pallor. Neck exam reveals no JVD or carotid bruits. Chest reveals rales at the right base but otherwise unremarkable. Cardiac exam reveals an S4 gallop otherwise unremarkable. Abdomen soft. No bruits are heard. Extremities reveal no  edema. Radial and posterior tibial pulses are 2+ and symmetric. Neurological exam is intact.  Labs: Lab Results  Component Value Date   WBC 10.7* 08/15/2014   HGB 12.9* 08/15/2014   HCT 38.0* 08/15/2014   MCV 93.1 08/15/2014   PLT 255 08/15/2014    Recent Labs Lab 08/15/14 0628 08/15/14 0632  NA 138 137  K 4.8 4.7  CL 104 103  CO2 24  --   BUN 34* 37*  CREATININE 1.54* 1.70*  CALCIUM 8.9  --   GLUCOSE 114* 114*   No results found for: PTT Lab Results  Component Value Date   INR 3.84* 08/15/2014   INR 3.2 08/12/2014   INR 6.6 08/03/2014   Lab Results  Component Value Date   CKTOTAL 132 11/08/2010   CKMB 4.7* 11/08/2010   TROPONINI 2.42* 02/29/2012     Lab Results  Component Value Date   CHOL 178 04/11/2014   CHOL 206* 01/04/2014   CHOL 98 11/13/2011   Lab Results  Component Value Date  HDL 40 04/11/2014   HDL 39* 01/04/2014   HDL 34.50* 11/13/2011   Lab Results  Component Value Date   LDLCALC 118* 04/11/2014   LDLCALC 139* 01/04/2014   LDLCALC 55 11/13/2011   Lab Results  Component Value Date   TRIG 98 04/11/2014   TRIG 142 01/04/2014   TRIG 43.0 11/13/2011   Lab Results  Component Value Date   CHOLHDL 4.5 04/11/2014   CHOLHDL 5.3 01/04/2014   CHOLHDL 3 11/13/2011      Radiology:  Dg Chest Port 1 View  08/15/2014   CLINICAL DATA:  Shortness of breath  EXAM: PORTABLE CHEST - 1 VIEW  COMPARISON:  02/29/2012  FINDINGS: There is chronic cardiomegaly. Coronary stents are noted. Stable and negative aortic and hilar contours for technique. Diffuse aortic atherosclerosis is present.  Diffuse interstitial opacity with Kerley lines. No evidence for pneumonia. No significant pleural effusion.  IMPRESSION: CHF.   Electronically Signed   By: Marnee Spring M.D.   On: 08/15/2014 06:43    EKG:  Normal sinus rhythm with Left atrial abnormality, prominent voltage consistent with LVH, left bundle branch block.    Lesleigh Noe 08/15/2014 8:56 AM

## 2014-08-16 ENCOUNTER — Inpatient Hospital Stay (HOSPITAL_COMMUNITY): Payer: Medicare Other

## 2014-08-16 ENCOUNTER — Telehealth: Payer: Self-pay | Admitting: *Deleted

## 2014-08-16 DIAGNOSIS — R778 Other specified abnormalities of plasma proteins: Secondary | ICD-10-CM | POA: Insufficient documentation

## 2014-08-16 DIAGNOSIS — R7989 Other specified abnormal findings of blood chemistry: Secondary | ICD-10-CM

## 2014-08-16 DIAGNOSIS — I509 Heart failure, unspecified: Secondary | ICD-10-CM

## 2014-08-16 DIAGNOSIS — I169 Hypertensive crisis, unspecified: Secondary | ICD-10-CM | POA: Insufficient documentation

## 2014-08-16 LAB — BASIC METABOLIC PANEL
ANION GAP: 9 (ref 5–15)
Anion gap: 8 (ref 5–15)
BUN: 33 mg/dL — AB (ref 6–20)
BUN: 34 mg/dL — ABNORMAL HIGH (ref 6–20)
CO2: 30 mmol/L (ref 22–32)
CO2: 30 mmol/L (ref 22–32)
Calcium: 8.6 mg/dL — ABNORMAL LOW (ref 8.9–10.3)
Calcium: 8.8 mg/dL — ABNORMAL LOW (ref 8.9–10.3)
Chloride: 96 mmol/L — ABNORMAL LOW (ref 101–111)
Chloride: 94 mmol/L — ABNORMAL LOW (ref 101–111)
Creatinine, Ser: 2.01 mg/dL — ABNORMAL HIGH (ref 0.61–1.24)
Creatinine, Ser: 2.12 mg/dL — ABNORMAL HIGH (ref 0.61–1.24)
GFR calc Af Amer: 37 mL/min — ABNORMAL LOW (ref >60)
GFR calc Af Amer: 35 mL/min — ABNORMAL LOW (ref >60)
GFR, EST NON AFRICAN AMERICAN: 32 mL/min — AB (ref >60)
GFR, EST NON AFRICAN AMERICAN: 30 mL/min — AB (ref >60)
Glucose, Bld: 113 mg/dL — ABNORMAL HIGH (ref 65–99)
Glucose, Bld: 117 mg/dL — ABNORMAL HIGH (ref 65–99)
Potassium: 3.1 mmol/L — ABNORMAL LOW (ref 3.5–5.1)
Potassium: 3.6 mmol/L (ref 3.5–5.1)
SODIUM: 133 mmol/L — AB (ref 135–145)
Sodium: 134 mmol/L — ABNORMAL LOW (ref 135–145)

## 2014-08-16 LAB — PROTIME-INR
INR: 4.01 — ABNORMAL HIGH (ref 0.00–1.49)
Prothrombin Time: 38 seconds — ABNORMAL HIGH (ref 11.6–15.2)

## 2014-08-16 LAB — BRAIN NATRIURETIC PEPTIDE: B Natriuretic Peptide: 905.7 pg/mL — ABNORMAL HIGH (ref 0.0–100.0)

## 2014-08-16 LAB — MAGNESIUM
MAGNESIUM: 1.9 mg/dL (ref 1.7–2.4)
Magnesium: 2.1 mg/dL (ref 1.7–2.4)

## 2014-08-16 LAB — CBC
HCT: 33.5 % — ABNORMAL LOW (ref 39.0–52.0)
HEMOGLOBIN: 10.6 g/dL — AB (ref 13.0–17.0)
MCH: 28.5 pg (ref 26.0–34.0)
MCHC: 31.6 g/dL (ref 30.0–36.0)
MCV: 90.1 fL (ref 78.0–100.0)
Platelets: 199 10*3/uL (ref 150–400)
RBC: 3.72 MIL/uL — ABNORMAL LOW (ref 4.22–5.81)
RDW: 14 % (ref 11.5–15.5)
WBC: 8.3 10*3/uL (ref 4.0–10.5)

## 2014-08-16 MED ORDER — CARVEDILOL 12.5 MG PO TABS
25.0000 mg | ORAL_TABLET | Freq: Two times a day (BID) | ORAL | Status: DC
  Administered 2014-08-16 – 2014-08-17 (×2): 25 mg via ORAL
  Filled 2014-08-16 (×2): qty 2

## 2014-08-16 MED ORDER — CARVEDILOL 12.5 MG PO TABS: 25.0000 mg | ORAL_TABLET | Freq: Two times a day (BID) | ORAL | Status: DC

## 2014-08-16 MED ORDER — CARVEDILOL 12.5 MG PO TABS
12.5000 mg | ORAL_TABLET | Freq: Once | ORAL | Status: AC
  Administered 2014-08-16: 12.5 mg via ORAL
  Filled 2014-08-16: qty 1

## 2014-08-16 MED ORDER — FUROSEMIDE 10 MG/ML IJ SOLN
60.0000 mg | Freq: Every day | INTRAMUSCULAR | Status: DC
  Administered 2014-08-17: 60 mg via INTRAVENOUS
  Filled 2014-08-16: qty 6

## 2014-08-16 MED ORDER — POTASSIUM CHLORIDE 10 MEQ/100ML IV SOLN
INTRAVENOUS | Status: AC
  Filled 2014-08-16: qty 100

## 2014-08-16 MED ORDER — HYDRALAZINE HCL 20 MG/ML IJ SOLN
10.0000 mg | INTRAMUSCULAR | Status: DC | PRN
  Administered 2014-08-16 – 2014-08-17 (×4): 20 mg via INTRAVENOUS
  Filled 2014-08-16 (×3): qty 1

## 2014-08-16 MED ORDER — DILTIAZEM HCL 30 MG PO TABS
30.0000 mg | ORAL_TABLET | Freq: Four times a day (QID) | ORAL | Status: DC
  Administered 2014-08-16 – 2014-08-17 (×4): 30 mg via ORAL
  Filled 2014-08-16 (×4): qty 1

## 2014-08-16 MED ORDER — HYDRALAZINE HCL 20 MG/ML IJ SOLN
10.0000 mg | INTRAMUSCULAR | Status: DC | PRN
  Filled 2014-08-16: qty 1

## 2014-08-16 MED ORDER — POTASSIUM CHLORIDE 10 MEQ/100ML IV SOLN
10.0000 meq | INTRAVENOUS | Status: AC
  Administered 2014-08-16 (×4): 10 meq via INTRAVENOUS
  Filled 2014-08-16 (×3): qty 100

## 2014-08-16 NOTE — Progress Notes (Signed)
Patient: Erik Adams / Admit Date: 08/15/2014 / Date of Encounter: 08/16/2014, 8:03 AM   Subjective: Feeling fine this AM. No SOB. Denies recent CP. No LEE.   Objective: Telemetry: NSR Physical Exam: Blood pressure 142/77, pulse 68, temperature 98.6 F (37 C), temperature source Oral, resp. rate 15, height 5\' 10"  (1.778 m), weight 149 lb 4 oz (67.7 kg), SpO2 97 %. General: Well developed, well nourished WM in no acute distress. Head: Normocephalic, atraumatic, sclera non-icteric, no xanthomas, nares are without discharge. Neck: JVP not elevated. Lungs: Clear bilaterally to auscultation without wheezes, rales, or rhonchi. Breathing is unlabored. Heart: RRR S1 S2 without murmurs, rubs. +s4 gallop. Abdomen: Soft, non-tender, non-distended with normoactive bowel sounds. No rebound/guarding. No renal bruits. Extremities: No clubbing or cyanosis. No edema. Distal pedal pulses are 2+ and equal bilaterally. Neuro: Alert and oriented X 3. Moves all extremities spontaneously. Psych:  Responds to questions appropriately with a normal affect.   Intake/Output Summary (Last 24 hours) at 08/16/14 0803 Last data filed at 08/16/14 0700  Gross per 24 hour  Intake 1310.2 ml  Output   5630 ml  Net -4319.8 ml    Inpatient Medications:  . amiodarone  200 mg Oral Daily  . aspirin  81 mg Oral Daily  . carvedilol  12.5 mg Oral BID WC  . doxazosin  1 mg Oral QHS  . furosemide  60 mg Intravenous Q12H  . hydrALAZINE  100 mg Oral TID  . levothyroxine  50 mcg Oral QAC breakfast  . potassium chloride  10 mEq Intravenous Q1 Hr x 4  . rosuvastatin  20 mg Oral Daily  . Warfarin - Pharmacist Dosing Inpatient   Does not apply q1800   Infusions:  . nitroGLYCERIN 100 mcg/min (08/16/14 0700)    Labs:  Recent Labs  08/15/14 0628 08/15/14 0632 08/16/14 0350  NA 138 137 134*  K 4.8 4.7 3.1*  CL 104 103 96*  CO2 24  --  30  GLUCOSE 114* 114* 113*  BUN 34* 37* 33*  CREATININE 1.54* 1.70* 2.01*    CALCIUM 8.9  --  8.6*  MG  --   --  1.9   No results for input(s): AST, ALT, ALKPHOS, BILITOT, PROT, ALBUMIN in the last 72 hours.  Recent Labs  08/15/14 0628 08/15/14 0632 08/16/14 0350  WBC 10.7*  --  8.3  HGB 11.8* 12.9* 10.6*  HCT 36.6* 38.0* 33.5*  MCV 93.1  --  90.1  PLT 255  --  199    Recent Labs  08/15/14 1220 08/15/14 1915  TROPONINI 0.31* 0.33*   Invalid input(s): POCBNP No results for input(s): HGBA1C in the last 72 hours.   Radiology/Studies:  Dg Chest Port 1 View  08/16/2014   CLINICAL DATA:  CHF  EXAM: PORTABLE CHEST - 1 VIEW  COMPARISON:  Chest x-ray from yesterday  FINDINGS: Improved pulmonary edema, although still present at the bases. There is worsening opacity at the medial left base which likely reflects atelectasis given the timing, obscuring the diaphragm. Stable cardiomegaly. Coronary stents again noted. Negative aortic and hilar contours. Skin folds on the right. No pneumothorax.  IMPRESSION: 1. Improved pulmonary edema. 2. Worsening left lower lobe aeration with rapid development suggesting atelectasis.   Electronically Signed   By: Marnee Spring M.D.   On: 08/16/2014 05:29   Dg Chest Port 1 View  08/15/2014   CLINICAL DATA:  Shortness of breath  EXAM: PORTABLE CHEST - 1 VIEW  COMPARISON:  02/29/2012  FINDINGS: There is chronic cardiomegaly. Coronary stents are noted. Stable and negative aortic and hilar contours for technique. Diffuse aortic atherosclerosis is present.  Diffuse interstitial opacity with Kerley lines. No evidence for pneumonia. No significant pleural effusion.  IMPRESSION: CHF.   Electronically Signed   By: Marnee Spring M.D.   On: 08/15/2014 06:43     Assessment and Plan  21M with CAD s/p BMS to LAD in 2000, DES to LAD/ramus in 2012, PAF on Coumadin, chronic combined CHF (pt declined ICD), LBBB, CKD stage III admitted with progressive dyspnea, hypoxia and confusion. Recent dx of renal artery stenosis, pending outpatient referral to  Dr. Allyson Sabal.  1. Hypertensive emergency, related to #2 2. Recently documented bilateral renal artery stenosis with occlusion of right renal artery stenosis and moderate stenosis in the left renal artery 3. Acute on chronic combined systolic and diastolic heart failure secondary to #1 and #2 4. History of paroxysmal atrial fibrillation, currently NSR on amiodarone therapy, prior history of LA thrombus 5. Coronary artery disease s/p prior BMS to LAD in 2000, DES to LAD/ramus in 2012 6. Left bundle branch block 7. Chronic kidney disease, stage III - not on ACEI/ARB due to this/RAS 8. Elevated troponin likely related to demand ischemia  BP improving on current regimen but still with spikes overnight - remains on carvedilol 12.5mg  BID, hydralazine  TID, high dose NTG gtt and Lasix at  IV BID. Diltiazem d/c'd due to LV dysfunction and possible contribution to recent decompensation. He has been unable previously to tolerate dihydropyridine therapy due to peripheral edema. Primary team is repleting KCl. As anticipated by Dr. Katrinka Blazing, there has been some compromise in renal function with volume contraction. Baseline Cr appears somewhere between 1.7-2.1. Weight is reportedly down to 149 but not clear if this is accurate since all other recent outpatient/inpatient weights were in the 160s. Will request a confirmation weight. He is -4.3L. Will discuss further plans for diuresis with Dr. Katrinka Blazing - can consider changing to oral form later today. It might be worthwhile to consider proceeding with renal angio this admission if possible, given complexity of the situation with CKD, needing to hold Coumadin, etc. We can consider titrating Coreg back up to  BID today.   Since troponin is felt more likely due to demand ischemia than an ACS, will discuss with MD whether to d/c aspirin since he is also on coumadin. Patient denies recent anginal sx.  Signed, Ronie Spies PA-C Pager: [phone]

## 2014-08-16 NOTE — Progress Notes (Signed)
ANTICOAGULATION CONSULT NOTE - Follow Up Consult  Pharmacy Consult for Warfarin Indication: atrial fibrillation  Allergies  Allergen Reactions  . Amlodipine Shortness Of Breath, Swelling and Palpitations    "Caused  Heart condition-per patient"  . Ace Inhibitors Other (See Comments)    Unknown reaction  . Clonidine Derivatives Other (See Comments)    Unknown reaction    Patient Measurements: Height: 5\' 10"  (177.8 cm) Weight: 149 lb 4 oz (67.7 kg) IBW/kg (Calculated) : 73  Vital Signs: Temp: 98.2 F (36.8 C) (07/05 1000) BP: 166/103 mmHg (07/05 1000) Pulse Rate: 52 (07/05 1000)  Labs:  Recent Labs  08/15/14 0628 08/15/14 0632 08/15/14 1220 08/15/14 1915 08/16/14 0350 08/16/14 1035  HGB 11.8* 12.9*  --   --  10.6*  --   HCT 36.6* 38.0*  --   --  33.5*  --   PLT 255  --   --   --  199  --   LABPROT 36.8*  --  39.7*  --   --  38.0*  INR 3.84*  --  4.23*  --   --  4.01*  CREATININE 1.54* 1.70*  --   --  2.01*  --   TROPONINI  --   --  0.31* 0.33*  --   --     Estimated Creatinine Clearance: 33.2 mL/min (by C-G formula based on Cr of 2.01).   Medications:  Scheduled:  . amiodarone  200 mg Oral Daily  . aspirin  81 mg Oral Daily  . carvedilol  25 mg Oral BID WC  . diltiazem  30 mg Oral 4 times per day  . doxazosin  1 mg Oral QHS  . [START ON 08/17/2014] furosemide  60 mg Intravenous Daily  . hydrALAZINE  100 mg Oral TID  . levothyroxine  50 mcg Oral QAC breakfast  . rosuvastatin  20 mg Oral Daily  . Warfarin - Pharmacist Dosing Inpatient   Does not apply q1800   Infusions:  . nitroGLYCERIN Stopped (08/16/14 1117)    Assessment: 63 yoM admitted on 7/4 with progressive dyspnea from acute pulmonary edema and HTN emergency.  PMH includes CAD s/p stent, HTN, CHF, CKD, CVA , Afib on chronic warfarin anticoagulation.  Pt is unable to verbalize home warfarin dosing, but per anticoag clinic note (7/1) he should be taking 5mg  daily except 2.5mg  on Mon/Fri.  Last dose  was taken PTA on 7/3; admission INR is supratherapeutic.  Pharmacy is consulted to dose warfarin.  Today, 08/16/2014:  INR 4.01, remains supratherapeutic, but decreased  CBC: Hgb decreased (10.6), Plt remain WNL.  No bleeding or complications reported  Drug-drug interactions: amiodarone (PTA medication)  Diet: Low sodium, eating 100% meals   Goal of Therapy:  INR 2-3 Monitor platelets by anticoagulation protocol: Yes   Plan:   Continue to hold warfarin.  Daily INR  Lynann Beaver PharmD, BCPS Pager [phone] 08/16/2014 11:23 AM

## 2014-08-16 NOTE — Progress Notes (Signed)
eLink Physician-Brief Progress Note Patient Name: Erik Adams DOB: 08/30/1945 MRN: [id number]   Date of Service  08/16/2014  HPI/Events of Note  Hypokalemia  eICU Interventions  Potassium replaced     Intervention Category Intermediate Interventions: Electrolyte abnormality - evaluation and management  DETERDING,ELIZABETH 08/16/2014, 5:45 AM

## 2014-08-16 NOTE — Telephone Encounter (Signed)
-----   Message from Lennette Bihari, MD sent at 08/11/2014  2:21 PM EDT ----- R renal occlusion; cyst in smaller R kidney; mild abnl L kidney.  PV eval with JB

## 2014-08-16 NOTE — Progress Notes (Signed)
PULMONARY / CRITICAL CARE MEDICINE   Name: Erik Adams MRN: [id number] DOB: [birth year]    ADMISSION DATE:  08/15/2014  REFERRING MD :  ER  CHIEF COMPLAINT:  Short of breath  BRIEF DESCRIPTION: 69 yo male with remote hx of smoking presented with progressive dyspnea from acute pulmonary edema and HTN emergency (BP 206/115).  He has hx of CAD s/p stent, HTN, combined CHF (EF 35 to 40% from 12/06/13), A fib, CKD, CVA.  STUDIES:   SIGNIFICANT EVENTS: 7/04 Admit   HISTORY OF PRESENT ILLNESS:   69 yo male presented with progressive dyspnea, chest pain, and confusion.  He took his medications and then came to the ER.  He was noted to be in respirator failure with oxygen saturation in 80's, cyanotic, and confused.  He was stared on oxygen/bipap.  His BP was 206/115.  He was started on NTG gtt and given lasix.  His breathing, oxygenation, chest pain, and mentation all improved.   SUBJECTIVE:  Mild HA Afebrile Good Uo with lasix  VITAL SIGNS: Temp:  [97.7 F (36.5 C)-99 F (37.2 C)] 98.2 F (36.8 C) (07/05 0800) Pulse Rate:  [60-80] 75 (07/05 0800) Resp:  [11-23] 14 (07/05 0800) BP: (142-210)/(76-102) 164/79 mmHg (07/05 0800) SpO2:  [90 %-99 %] 97 % (07/05 0800) Weight:  [67.7 kg (149 lb 4 oz)] 67.7 kg (149 lb 4 oz) (07/05 0500) INTAKE / OUTPUT:  Intake/Output Summary (Last 24 hours) at 08/16/14 0852 Last data filed at 08/16/14 0814  Gross per 24 hour  Intake 1469.4 ml  Output   4530 ml  Net -3060.6 ml    PHYSICAL EXAMINATION: General: Awake, alert sitting up in bed Neuro:  Alert, normal strength, CN II-XII grossly intact.  HEENT:  PERRL Cardiovascular: RRR, no rubs, gallops, or murmurs. No carotid bruits Lungs:  CTA Abdomen:  Soft, non tender, nondistened Musculoskeletal:  No edema Skin:  No rashes  LABS:  CBC  Recent Labs Lab 08/15/14 0628 08/15/14 0632 08/16/14 0350  WBC 10.7*  --  8.3  HGB 11.8* 12.9* 10.6*  HCT 36.6* 38.0* 33.5*  PLT 255  --  199    Coag's  Recent Labs Lab 08/12/14 1516 08/15/14 0628 08/15/14 1220  INR 3.2 3.84* 4.23*   BMET  Recent Labs Lab 08/15/14 0628 08/15/14 0632 08/16/14 0350  NA 138 137 134*  K 4.8 4.7 3.1*  CL 104 103 96*  CO2 24  --  30  BUN 34* 37* 33*  CREATININE 1.54* 1.70* 2.01*  GLUCOSE 114* 114* 113*   Electrolytes  Recent Labs Lab 08/15/14 0628 08/16/14 0350  CALCIUM 8.9 8.6*  MG  --  1.9   Cardiac Enzymes  Recent Labs Lab 08/15/14 1220 08/15/14 1915  TROPONINI 0.31* 0.33*    Imaging Dg Chest Port 1 View  08/16/2014   CLINICAL DATA:  CHF  EXAM: PORTABLE CHEST - 1 VIEW  COMPARISON:  Chest x-ray from yesterday  FINDINGS: Improved pulmonary edema, although still present at the bases. There is worsening opacity at the medial left base which likely reflects atelectasis given the timing, obscuring the diaphragm. Stable cardiomegaly. Coronary stents again noted. Negative aortic and hilar contours. Skin folds on the right. No pneumothorax.  IMPRESSION: 1. Improved pulmonary edema. 2. Worsening left lower lobe aeration with rapid development suggesting atelectasis.   Electronically Signed   By: Marnee Spring M.D.   On: 08/16/2014 05:29     ASSESSMENT / PLAN:  PULMONARY A: Acute hypoxic respiratory failure 2nd to  acute pulmonary edema>resolved P:   Oxygen to keep SpO2 90 to 95% CXR in AM  CARDIOVASCULAR A:  Acute on chronic combined heart failure. HTN emergency Hx of A fib, HLD, CAD s/p stent. P:  Continue IV NTG gtt Wean NTG gtt as tolerated to maintain SBP >185 Deescalate  IV lasix to Q 24 Continue ASA, Amiodarone, hydralazine Increase coreg to 25mg   Add back home cardizem Hold imdur Coumadin per pharmacy Continue crestor Cardiology following  RENAL A:   CKD stage 3 Hypokalemia-repleted 7/5 P:   Cardura BMP in evening and in AM Replace lytes as indicated  GASTROINTESTINAL A:   Nutrition. P:   Heart healthy diet  HEMATOLOGIC A:   No acute  issues. P:  CBC in AM  INFECTIOUS A:   No evidence for infection. P:   Monitor clinically  ENDOCRINE A:   Hx of hypothyroidism. P:   Continue synthroid  NEUROLOGIC A:   Acute encephalopathy 2nd to hypoxia, and HTN emergency >> improved with Tx. P:   Monitor mental status  Summary: HTN improving with NTG gtt down. Will continue NTG gtt to maintain SBP goal. SCr has been increasing due to good response to diuresis. Now over 4L negative with subsequent improvement in CXR. Will back down on dosing today. No longer requiring BiPap therapy. Increased home coreg and restarted home dilt at low dose in attempt to continue NTG gtt wean. No longer requiring ICU care at this point in time. Will transfer to [facility] for further management.   Attending note - Much improved post diuresis with lasix, NTG gtt  lower , ttirate coreg & hydralazine up, add cardizem & come off NTG, can re add PO nitrates Also back off lasix to once daily since cr rising Can tr to SDU status Will ask [facility] tot ake over  Oretha Milch. MD

## 2014-08-16 NOTE — Progress Notes (Signed)
eLink Physician-Brief Progress Note Patient Name: Erik Adams DOB: [birth year] MRN: [id number]   Date of Service  08/16/2014  HPI/Events of Note  Hypertension with SBP greater than 170 mmHg despite NTG gtt and scheduled hydralazine.  eICU Interventions  PRN hydralazine 10 mg IV q2 hours     Intervention Category Intermediate Interventions: Hypertension - evaluation and management  DETERDING,ELIZABETH 08/16/2014, 5:10 AM

## 2014-08-16 NOTE — Progress Notes (Signed)
  Echocardiogram 2D Echocardiogram has been performed.  Leta Jungling M 08/16/2014, 1:57 PM

## 2014-08-17 ENCOUNTER — Inpatient Hospital Stay (HOSPITAL_COMMUNITY): Payer: Medicare Other

## 2014-08-17 ENCOUNTER — Other Ambulatory Visit: Payer: Medicare Other

## 2014-08-17 ENCOUNTER — Telehealth: Payer: Self-pay | Admitting: Cardiovascular Disease

## 2014-08-17 ENCOUNTER — Other Ambulatory Visit: Payer: Self-pay | Admitting: Physician Assistant

## 2014-08-17 DIAGNOSIS — I5021 Acute systolic (congestive) heart failure: Secondary | ICD-10-CM | POA: Insufficient documentation

## 2014-08-17 DIAGNOSIS — N183 Chronic kidney disease, stage 3 unspecified: Secondary | ICD-10-CM

## 2014-08-17 LAB — BASIC METABOLIC PANEL
ANION GAP: 10 (ref 5–15)
BUN: 39 mg/dL — AB (ref 6–20)
CO2: 28 mmol/L (ref 22–32)
CREATININE: 2.12 mg/dL — AB (ref 0.61–1.24)
Calcium: 8.8 mg/dL — ABNORMAL LOW (ref 8.9–10.3)
Chloride: 97 mmol/L — ABNORMAL LOW (ref 101–111)
GFR calc Af Amer: 35 mL/min — ABNORMAL LOW (ref >60)
GFR, EST NON AFRICAN AMERICAN: 30 mL/min — AB (ref >60)
Glucose, Bld: 102 mg/dL — ABNORMAL HIGH (ref 65–99)
POTASSIUM: 3.8 mmol/L (ref 3.5–5.1)
Sodium: 135 mmol/L (ref 135–145)

## 2014-08-17 LAB — CBC
HEMATOCRIT: 36 % — AB (ref 39.0–52.0)
Hemoglobin: 11.6 g/dL — ABNORMAL LOW (ref 13.0–17.0)
MCH: 29.3 pg (ref 26.0–34.0)
MCHC: 32.2 g/dL (ref 30.0–36.0)
MCV: 90.9 fL (ref 78.0–100.0)
PLATELETS: 201 10*3/uL (ref 150–400)
RBC: 3.96 MIL/uL — AB (ref 4.22–5.81)
RDW: 14.1 % (ref 11.5–15.5)
WBC: 7.5 10*3/uL (ref 4.0–10.5)

## 2014-08-17 LAB — PROTIME-INR
INR: 3.52 — ABNORMAL HIGH (ref 0.00–1.49)
Prothrombin Time: 34.5 seconds — ABNORMAL HIGH (ref 11.6–15.2)

## 2014-08-17 LAB — MAGNESIUM: MAGNESIUM: 1.9 mg/dL (ref 1.7–2.4)

## 2014-08-17 MED ORDER — ISOSORBIDE MONONITRATE ER 60 MG PO TB24
60.0000 mg | ORAL_TABLET | Freq: Two times a day (BID) | ORAL | Status: DC
  Administered 2014-08-17: 60 mg via ORAL
  Filled 2014-08-17: qty 1

## 2014-08-17 MED ORDER — FUROSEMIDE 40 MG PO TABS: 80.0000 mg | ORAL_TABLET | Freq: Every day | ORAL | Status: DC

## 2014-08-17 MED ORDER — WARFARIN SODIUM 1 MG PO TABS
ORAL_TABLET | ORAL | Status: DC
Start: 2014-08-17 — End: 2014-10-31

## 2014-08-17 MED ORDER — FUROSEMIDE 80 MG PO TABS: 80.0000 mg | ORAL_TABLET | Freq: Every day | ORAL | Status: DC

## 2014-08-17 NOTE — Telephone Encounter (Signed)
TCM per staff messages  Scheduled with Tereso Newcomer 07/14 @ 9am   Staff Message:   Primary Cardiologist: Dr. Tresa Endo  Date of Discharge: will likely be today - 08/17/2014  Appointment Needed Within: 1 week  Appointment Type: transition of care (CHF) with BMET   He was recently referred to Dr Allyson Sabal for [facility] evaluation and also needs that appointment (see Wanda's phone note), but if that is not going to happen within 1 week, he needs a TOC appointment in the meantime within 1 week.   Thank you!  Dayna Dunn PA-C

## 2014-08-17 NOTE — Progress Notes (Signed)
Pharmacist Heart Failure Core Measure Documentation  Assessment: Erik Adams has an EF documented as 35% -  40% on 08/16/14 by 2D Echo.  Rationale: Heart failure patients with left ventricular systolic dysfunction (LVSD) and an EF < 40% should be prescribed an angiotensin converting enzyme inhibitor (ACEI) or angiotensin receptor blocker (ARB) at discharge unless a contraindication is documented in the medical record.  This patient is not currently on an ACEI or ARB for HF.  This note is being placed in the record in order to provide documentation that a contraindication to the use of these agents is present for this encounter.  ACE Inhibitor or Angiotensin Receptor Blocker is contraindicated (specify all that apply)  []   ACEI allergy AND ARB allergy []   Angioedema []   Moderate or severe aortic stenosis []   Hyperkalemia []   Hypotension []   Renal artery stenosis [x]   Worsening renal function, preexisting renal disease or dysfunction   Lynann Beaver PharmD, BCPS Pager [phone] 08/17/2014 1:11 PM

## 2014-08-17 NOTE — Progress Notes (Signed)
ANTICOAGULATION CONSULT NOTE - Follow Up Consult  Pharmacy Consult for Warfarin Indication: atrial fibrillation  Allergies  Allergen Reactions  . Amlodipine Shortness Of Breath, Swelling and Palpitations    "Caused  Heart condition-per patient"  . Ace Inhibitors Other (See Comments)    Unknown reaction  . Clonidine Derivatives Other (See Comments)    Unknown reaction    Patient Measurements: Height: 5\' 10"  (177.8 cm) Weight: 149 lb 4 oz (67.7 kg) IBW/kg (Calculated) : 73  Vital Signs: Temp: 98.8 F (37.1 C) (07/06 0544) BP: 169/70 mmHg (07/06 0544) Pulse Rate: 68 (07/06 0544)  Labs:  Recent Labs  08/15/14 0628 08/15/14 1324 08/15/14 1220 08/15/14 1915 08/16/14 0350 08/16/14 1035 08/16/14 1515 08/17/14 0350  HGB 11.8* 12.9*  --   --  10.6*  --   --  11.6*  HCT 36.6* 38.0*  --   --  33.5*  --   --  36.0*  PLT 255  --   --   --  199  --   --  201  LABPROT 36.8*  --  39.7*  --   --  38.0*  --  34.5*  INR 3.84*  --  4.23*  --   --  4.01*  --  3.52*  CREATININE 1.54* 1.70*  --   --  2.01*  --  2.12* 2.12*  TROPONINI  --   --  0.31* 0.33*  --   --   --   --     Estimated Creatinine Clearance: 31.5 mL/min (by C-G formula based on Cr of 2.12).   Medications:  Scheduled:  . amiodarone  200 mg Oral Daily  . aspirin  81 mg Oral Daily  . carvedilol  25 mg Oral BID WC  . diltiazem  30 mg Oral 4 times per day  . doxazosin  1 mg Oral QHS  . furosemide  60 mg Intravenous Daily  . hydrALAZINE  100 mg Oral TID  . levothyroxine  50 mcg Oral QAC breakfast  . rosuvastatin  20 mg Oral Daily  . Warfarin - Pharmacist Dosing Inpatient   Does not apply q1800   Infusions:  . nitroGLYCERIN Stopped (08/16/14 1117)    Assessment: 72 yoM admitted on 7/4 with progressive dyspnea from acute pulmonary edema and HTN emergency.  PMH includes CAD s/p stent, HTN, CHF, CKD, CVA , Afib on chronic warfarin anticoagulation.  Pt is unable to verbalize home warfarin dosing, but per anticoag  clinic note (7/1) he should be taking 5mg  daily except 2.5mg  on Mon/Fri.  Last dose was taken PTA on 7/3; admission INR is supratherapeutic.  Pharmacy is consulted to dose warfarin.  Today, 08/17/2014:  INR 3.52, remains supratherapeutic, but decreased  CBC: Hgb improved (11.6), Plt remain WNL.  No bleeding or complications reported  Drug-drug interactions: amiodarone (PTA medication)  Diet: Low sodium, eating 100% meals   Goal of Therapy:  INR 2-3 Monitor platelets by anticoagulation protocol: Yes   Plan:   Continue to hold warfarin.  Daily INR  Lynann Beaver PharmD, BCPS Pager [phone] 08/17/2014 7:32 AM

## 2014-08-17 NOTE — Discharge Instructions (Signed)
DO NOT TAKE COUMADIN UNTIL YOU SEE YOUR PRIMARY CARE DOCTOR   Hypertension Hypertension, commonly called high blood pressure, is when the force of blood pumping through your arteries is too strong. Your arteries are the blood vessels that carry blood from your heart throughout your body. A blood pressure reading consists of a higher number over a lower number, such as 110/72. The higher number (systolic) is the pressure inside your arteries when your heart pumps. The lower number (diastolic) is the pressure inside your arteries when your heart relaxes. Ideally you want your blood pressure below 120/80. Hypertension forces your heart to work harder to pump blood. Your arteries may become narrow or stiff. Having hypertension puts you at risk for heart disease, stroke, and other problems.  RISK FACTORS Some risk factors for high blood pressure are controllable. Others are not.  Risk factors you cannot control include:   Race. You may be at higher risk if you are African American.  Age. Risk increases with age.  Gender. Men are at higher risk than women before age 35 years. After age 84, women are at higher risk than men. Risk factors you can control include:  Not getting enough exercise or physical activity.  Being overweight.  Getting too much fat, sugar, calories, or salt in your diet.  Drinking too much alcohol. SIGNS AND SYMPTOMS Hypertension does not usually cause signs or symptoms. Extremely high blood pressure (hypertensive crisis) may cause headache, anxiety, shortness of breath, and nosebleed. DIAGNOSIS  To check if you have hypertension, your health care provider will measure your blood pressure while you are seated, with your arm held at the level of your heart. It should be measured at least twice using the same arm. Certain conditions can cause a difference in blood pressure between your right and left arms. A blood pressure reading that is higher than normal on one occasion  does not mean that you need treatment. If one blood pressure reading is high, ask your health care provider about having it checked again. TREATMENT  Treating high blood pressure includes making lifestyle changes and possibly taking medicine. Living a healthy lifestyle can help lower high blood pressure. You may need to change some of your habits. Lifestyle changes may include:  Following the DASH diet. This diet is high in fruits, vegetables, and whole grains. It is low in salt, red meat, and added sugars.  Getting at least 2 hours of brisk physical activity every week.  Losing weight if necessary.  Not smoking.  Limiting alcoholic beverages.  Learning ways to reduce stress. If lifestyle changes are not enough to get your blood pressure under control, your health care provider may prescribe medicine. You may need to take more than one. Work closely with your health care provider to understand the risks and benefits. HOME CARE INSTRUCTIONS  Have your blood pressure rechecked as directed by your health care provider.   Take medicines only as directed by your health care provider. Follow the directions carefully. Blood pressure medicines must be taken as prescribed. The medicine does not work as well when you skip doses. Skipping doses also puts you at risk for problems.   Do not smoke.   Monitor your blood pressure at home as directed by your health care provider. SEEK MEDICAL CARE IF:   You think you are having a reaction to medicines taken.  You have recurrent headaches or feel dizzy.  You have swelling in your ankles.  You have trouble with your  vision. SEEK IMMEDIATE MEDICAL CARE IF:  You develop a severe headache or confusion.  You have unusual weakness, numbness, or feel faint.  You have severe chest or abdominal pain.  You vomit repeatedly.  You have trouble breathing. MAKE SURE YOU:   Understand these instructions.  Will watch your condition.  Will get  help right away if you are not doing well or get worse. Document Released: 01/28/2005 Document Revised: 06/14/2013 Document Reviewed: 11/20/2012 [facility] Patient Information 2015 [place], [place]. This information is not intended to replace advice given to you by your health care provider. Make sure you discuss any questions you have with your health care provider.

## 2014-08-17 NOTE — Progress Notes (Signed)
Discussed plan for dc with Dr. Katrinka Blazing. He feels if patient ambulates and BP remains reasonable, he can go home - needs early f/u and BMET. Lasix should be 80mg  daily. I could not reach our office schedulers so I sent a message in Epic to help arrange follow-up. I told them that if his referral appt with Dr. Allyson Sabal will not be within 1 week that he needs a TOC appointment within 1 week as well, with a BMET. Sabastian Raimondi PA-C

## 2014-08-17 NOTE — Discharge Summary (Signed)
Physician Discharge Summary  Erik Adams ZHY:[id number] DOB: 11/22/1945 DOA: 08/15/2014  PCP: No PCP Per Patient  Admit date: 08/15/2014 Discharge date: 08/17/2014  Recommendations for Outpatient Follow-up:  1. Pt will need to follow up with PCP in 2-3 weeks post discharge 2. Please obtain BMP to evaluate electrolytes and kidney function 3. Please also check CBC to evaluate Hg and Hct levels 4. Please also note that pt was advised to have PT/INR checked in AM so that he can have recommendations provided for coumadin dose 5. Pt has insisted on going home or leaving AMA, asked nurse to schedule an appointment with PCP in 1-2 days the latest for PT/INR check   Discharge Diagnoses:  Active Problems:   Hypertensive emergency   Bilateral renal artery stenosis   Acute on chronic combined systolic and diastolic heart failure, NYHA class 4   Hypertensive crisis   Elevated troponin  Discharge Condition: Stable  Diet recommendation: Heart healthy diet discussed in details    Brief narrative:    BRIEF DESCRIPTION: 69 yo male with remote hx of smoking presented with progressive dyspnea from acute pulmonary edema and HTN emergency (BP 206/115). He has hx of CAD s/p stent, HTN, combined CHF (EF 35 to 40% from 12/06/13), A fib, CKD, CVA. In emergency department, patient started on nitroglycerin drip and Lasix IV, also required placement on BiPAP due to oxygen saturations in 80s.  SIGNIFICANT EVENTS: 7/04 admit 7/06 transfer to telemetry bed, TRH took over from PCCM   Assessment/Plan:    Active Problems:  Acute hypoxic respiratory failure secondary to acute on chronic pulmonary edema, combined systolic and diastolic heart failure, class IV - Respiratory status stable this morning, still requiring oxygen via nasal cannula - Continue Lasix 80 mg by mouth daily - cardiology team cleared for discharge provided pt sees PCP in 1-2 days    Hypertensive emergency in patient with known  bilateral renal artery stenosis - Patient currently on Coreg 25 mg PO twice a day, hydralazine 100 mg PO 3 times a day, isosorbide mononitrate 60 mg PO twice a day, Lasix 80 mg PO daily  - Blood pressure better controlled, continuing same regimen    Bilateral renal artery stenosis - outpatient work up with Dr. Allyson Sabal   Acute on chronic combined systolic and diastolic heart failure, NYHA class 4 - Weight stable at 149 lbs, which is down from 165 lbs on admission  - appreciate cardiology team assistance, continuing Lasix    Hx of A fib, HLD, CAD s/p stent. - Continue aspirin, amiodarone, hydralazine  - Also continue Crestor for hyperlipidemia  - since INR still supra therapeutic, pt advised not to take Coumadin until he sees PCP    Elevated troponin - from demand ischemia in the setting of the above, HTN emergency and acute and chronic combined CHF - appreciate cardiology team following    CKD stage 3 - Creatinine remains stable at 2, at baseline Cr now 1.7 - 2.1   Hypokalemia - In the setting of Lasix use   Hypothyroidism - Continue Synthroid   Acute encephalopathy 2nd to hypoxia, and HTN emergency  - Improved with treatment of pulmonary vascular congestion and hypertensive emergency - Mental status at baseline this morning   Code Status: Full.  Family Communication: plan of care discussed with the patient Disposition Plan: Pt insists on going home or leaving AMA  IV access:  Peripheral IV  Procedures and diagnostic studies:   Dg Chest Port 1 View 08/17/2014 Improving bibasilar  aeration. 2. Pulmonary edema on admission radiography has cleared.   Dg Chest Port 1 View 08/16/2014 Improved pulmonary edema. 2. Worsening left lower lobe aeration with rapid development suggesting atelectasis.   Dg Chest Port 1 View 08/15/2014 CHF.   Medical Consultants:  Cardiology   Other Consultants:  PT  IAnti-Infectives:   None        Discharge Exam: Filed Vitals:   08/17/14 1123  BP: 151/121  Pulse: 58  Temp: 97.9 F (36.6 C)  Resp: 15   Filed Vitals:   08/17/14 0800 08/17/14 0812 08/17/14 1021 08/17/14 1123  BP: 186/84 186/84 158/74 151/121  Pulse: 69 70 59 58  Temp: 99 F (37.2 C)  98.2 F (36.8 C) 97.9 F (36.6 C)  TempSrc: Core (Comment)  Core (Comment) Core (Comment)  Resp: 12  15 15   Height:      Weight:      SpO2: 95%  96% 98%    General: Pt is alert, follows commands appropriately, not in acute distress Cardiovascular: Regular rate and rhythm, S1/S2 +, no rubs, no gallops Respiratory: Clear to auscultation bilaterally, no wheezing, diminished breath sounds at bases  Abdominal: Soft, non tender, non distended, bowel sounds +, no guarding Extremities: no edema, no cyanosis, pulses palpable bilaterally DP and PT Neuro: Grossly nonfocal  Discharge Instructions  Discharge Instructions    Diet - low sodium heart healthy    Complete by:  As directed      Increase activity slowly    Complete by:  As directed             Medication List    STOP taking these medications        diltiazem 120 MG 24 hr capsule  Commonly known as:  CARDIZEM CD      TAKE these medications        amiodarone 200 MG tablet  Commonly known as:  PACERONE  Take 200 mg by mouth daily.     aspirin 81 MG chewable tablet  Chew 1 tablet (81 mg total) by mouth daily.     carvedilol 25 MG tablet  Commonly known as:  COREG  Take 25 mg by mouth 2 (two) times daily with a meal.     doxazosin 1 MG tablet  Commonly known as:  CARDURA  Take 1 tablet (1 mg total) by mouth at bedtime.     furosemide 80 MG tablet  Commonly known as:  LASIX  Take 1 tablet (80 mg total) by mouth daily.     hydrALAZINE 50 MG tablet  Commonly known as:  APRESOLINE  Take 100 mg by mouth 3 (three) times daily.     isosorbide mononitrate 60 MG 24 hr tablet  Commonly known as:  IMDUR  Take 60 mg by mouth 2 (two) times daily.      ketorolac 0.5 % ophthalmic solution  Commonly known as:  ACULAR  Place 1 drop into the left eye 4 (four) times daily.     levothyroxine 50 MCG tablet  Commonly known as:  SYNTHROID, LEVOTHROID  Take 1 tablet (50 mcg total) by mouth daily before breakfast.     nitroGLYCERIN 0.4 MG SL tablet  Commonly known as:  NITROSTAT  Place 1 tablet (0.4 mg total) under the tongue every 5 (five) minutes x 3 doses as needed. For chest pain     potassium chloride SA 20 MEQ tablet  Commonly known as:  K-DUR,KLOR-CON  Take 1 tablet (20 mEq total) by mouth daily.  rosuvastatin 20 MG tablet  Commonly known as:  CRESTOR  Take 1 tablet (20 mg total) by mouth daily.     warfarin 1 MG tablet  Commonly known as:  COUMADIN  Please do not take Coumadin until you see PCP as your INR is 3.52, You must see your PCP in 1 day to have PT/INR checked            Follow-up Information    Follow up with Lennette Bihari, MD.   Specialty:  Cardiology   Why:  Office will call you for your followup appointment. Call office if you have not heard back in 3 days. Make sure to ask [facility] location your appointment is because sometimes your post-hospital visit is in our sister office.   Contact information:   [address] [place] [address] [phone]        The results of significant diagnostics from this hospitalization (including imaging, microbiology, ancillary and laboratory) are listed below for reference.     Microbiology: Recent Results (from the past 240 hour(s))  MRSA PCR Screening     Status: None   Collection Time: 08/15/14  9:52 AM  Result Value Ref Range Status   MRSA by PCR NEGATIVE NEGATIVE Final    Comment:        The GeneXpert MRSA Assay (FDA approved for NASAL specimens only), is one component of a comprehensive MRSA colonization surveillance program. It is not intended to diagnose MRSA infection nor to guide or monitor treatment for MRSA infections.       Labs: Basic Metabolic Panel:  Recent Labs Lab 08/15/14 0628 08/15/14 0632 08/16/14 0350 08/16/14 1515 08/17/14 0350  NA 138 137 134* 133* 135  K 4.8 4.7 3.1* 3.6 3.8  CL 104 103 96* 94* 97*  CO2 24  --  30 30 28   GLUCOSE 114* 114* 113* 117* 102*  BUN 34* 37* 33* 34* 39*  CREATININE 1.54* 1.70* 2.01* 2.12* 2.12*  CALCIUM 8.9  --  8.6* 8.8* 8.8*  MG  --   --  1.9 2.1 1.9   CBC:  Recent Labs Lab 08/15/14 0628 08/15/14 0632 08/16/14 0350 08/17/14 0350  WBC 10.7*  --  8.3 7.5  HGB 11.8* 12.9* 10.6* 11.6*  HCT 36.6* 38.0* 33.5* 36.0*  MCV 93.1  --  90.1 90.9  PLT 255  --  199 201   Cardiac Enzymes:  Recent Labs Lab 08/15/14 1220 08/15/14 1915  TROPONINI 0.31* 0.33*   BNP: BNP (last 3 results)  Recent Labs  08/15/14 0629 08/16/14 0350  BNP 1783.2* 905.7*    SIGNED: Time coordinating discharge: 30 minutes  Debbora Presto, MD  [facility] 08/17/2014, 12:19 PM Pager [phone]  If 7PM-7AM, please contact night-coverage [email or web address] Password [email or web address]

## 2014-08-17 NOTE — Telephone Encounter (Signed)
Will forward to [facility] office 

## 2014-08-17 NOTE — Progress Notes (Signed)
Patient: Erik Adams / Admit Date: 08/15/2014 / Date of Encounter: 08/17/2014, 7:43 AM   Subjective: No complaints. He is frustrated to be here. "My blood pressure hasn't moved in 70 years."   Objective: Telemetry: NSR Physical Exam: Blood pressure 169/70, pulse 68, temperature 98.8 F (37.1 C), temperature source Oral, resp. rate 18, height 5\' 10"  (1.778 m), weight 149 lb 4 oz (67.7 kg), SpO2 96 %. General: Well developed, well nourished WM in no acute distress. Head: Normocephalic, atraumatic, sclera non-icteric, no xanthomas, nares are without discharge. Neck: JVP not elevated. Lungs: Clear bilaterally to auscultation without wheezes, rales, or rhonchi. Breathing is unlabored. Heart: RRR S1 S2 without murmurs, rubs. +S4 gallop. Abdomen: Soft, non-tender, non-distended with normoactive bowel sounds. No rebound/guarding. No renal bruits. Extremities: No clubbing or cyanosis. No edema. Distal pedal pulses are 2+ and equal bilaterally. Neuro: Alert and oriented X 3. Moves all extremities spontaneously. Psych: Responds to questions appropriately with a normal affect.   Intake/Output Summary (Last 24 hours) at 08/17/14 0743 Last data filed at 08/17/14 0400  Gross per 24 hour  Intake  680.4 ml  Output   2050 ml  Net -1369.6 ml    Inpatient Medications:  . amiodarone  200 mg Oral Daily  . aspirin  81 mg Oral Daily  . carvedilol  25 mg Oral BID WC  . diltiazem  30 mg Oral 4 times per day  . doxazosin  1 mg Oral QHS  . furosemide  60 mg Intravenous Daily  . hydrALAZINE  100 mg Oral TID  . levothyroxine  50 mcg Oral QAC breakfast  . rosuvastatin  20 mg Oral Daily  . Warfarin - Pharmacist Dosing Inpatient   Does not apply q1800   Infusions:  . nitroGLYCERIN Stopped (08/16/14 1117)    Labs:  Recent Labs  08/16/14 1515 08/17/14 0350  NA 133* 135  K 3.6 3.8  CL 94* 97*  CO2 30 28  GLUCOSE 117* 102*  BUN 34* 39*  CREATININE 2.12* 2.12*  CALCIUM 8.8* 8.8*  MG 2.1 1.9     No results for input(s): AST, ALT, ALKPHOS, BILITOT, PROT, ALBUMIN in the last 72 hours.  Recent Labs  08/16/14 0350 08/17/14 0350  WBC 8.3 7.5  HGB 10.6* 11.6*  HCT 33.5* 36.0*  MCV 90.1 90.9  PLT 199 201    Recent Labs  08/15/14 1220 08/15/14 1915  TROPONINI 0.31* 0.33*   Invalid input(s): POCBNP No results for input(s): HGBA1C in the last 72 hours.   Radiology/Studies:  Dg Chest Port 1 View  08/17/2014   CLINICAL DATA:  Pulmonary infiltrates  EXAM: PORTABLE CHEST - 1 VIEW  COMPARISON:  Chest x-ray from yesterday  FINDINGS: Improved bibasilar aeration, including suspected atelectasis on the left. Stable interstitial coarsening without residual edema. Possible small left pleural effusion. Stable cardiomegaly. Coronary stents/atherosclerosis again noted.  IMPRESSION: 1. Improving bibasilar aeration. 2. Pulmonary edema on admission radiography has cleared.   Electronically Signed   By: Marnee Spring M.D.   On: 08/17/2014 06:57   Dg Chest Port 1 View  08/16/2014   CLINICAL DATA:  CHF  EXAM: PORTABLE CHEST - 1 VIEW  COMPARISON:  Chest x-ray from yesterday  FINDINGS: Improved pulmonary edema, although still present at the bases. There is worsening opacity at the medial left base which likely reflects atelectasis given the timing, obscuring the diaphragm. Stable cardiomegaly. Coronary stents again noted. Negative aortic and hilar contours. Skin folds on the right. No pneumothorax.  IMPRESSION: 1.  Improved pulmonary edema. 2. Worsening left lower lobe aeration with rapid development suggesting atelectasis.   Electronically Signed   By: Marnee Spring M.D.   On: 08/16/2014 05:29   Dg Chest Port 1 View  08/15/2014   CLINICAL DATA:  Shortness of breath  EXAM: PORTABLE CHEST - 1 VIEW  COMPARISON:  02/29/2012  FINDINGS: There is chronic cardiomegaly. Coronary stents are noted. Stable and negative aortic and hilar contours for technique. Diffuse aortic atherosclerosis is present.  Diffuse  interstitial opacity with Kerley lines. No evidence for pneumonia. No significant pleural effusion.  IMPRESSION: CHF.   Electronically Signed   By: Marnee Spring M.D.   On: 08/15/2014 06:43     Assessment and Plan  67M with CAD s/p BMS to LAD in 2000, DES to LAD/ramus in 2012, PAF on Coumadin, chronic combined CHF (pt declined ICD), LBBB, CKD stage III admitted with progressive dyspnea, hypoxia and confusion. Recent dx of renal artery stenosis, pending outpatient referral to Dr. Allyson Sabal.  1. Hypertensive emergency, related to #2 2. Recently documented bilateral renal artery stenosis with occlusion of right renal artery stenosis and moderate stenosis in the left renal artery 3. Acute on chronic combined systolic and diastolic heart failure secondary to #1 and #2 4. History of paroxysmal atrial fibrillation, currently NSR on amiodarone therapy, prior history of LA thrombus 5. Coronary artery disease s/p prior BMS to LAD in 2000, DES to LAD/ramus in 2012 6. Left bundle branch block 7. Chronic kidney disease, stage III - not on ACEI/ARB due to this/RAS (baseline Cr appears 1.7-2.1) 8. Elevated troponin likely related to demand ischemia - no plan for ischemic eval at present time  2D Echo shows EF stable at 35-40% with grade 1 DD, mild AI, mild MR, mild-mod dil LA, mod-severely dilated RA, mod TR, PASP . Will clarify med plan with Dr. Katrinka Blazing. His note reports to change Lasix to oral form but the patient was changed to IV Lasix  once daily yesterday. NTG gtt was weaned and diltiazem was added by PCCM. There is some concern that recent addition of diltiazem contributed to decompensation given his LV dysfunction. BP remains elevated on Coreg  BID, hydralazine  TID, IV Lasix  daily and diltiazem. He also required a few doses of IV PRN hydralazine overnight. Will add back home Imdur. Cr remains slightly elevated above admission at 2.12. Per Dr. Katrinka Blazing, would continue plan for outpatient  workup and management of bilateral RAS will be best as it will allow renal function to settle prior to contrast exposure. Referral to Dr. Allyson Sabal is already being processed by Dr. Landry Dyke nurse. Patient is extremely restless to leave and not sure how much longer he'll stay for med titration.  Signed, Ronie Spies PA-C Pager: [phone]

## 2014-08-17 NOTE — Progress Notes (Signed)
Discharge paperwork reviewed with patient and patients daughter. RN placed a lot of emphasis on making sure that the patient starts taking 80mg  of lasix at home and that he DOES NOT take any warfarin until he sees his PCP due to high PT/INR levels. Pt and daughter understood. RN highly  encouraged pt to see his PCP tomorrow to get PT/INR evaluated and checked. He stated that he was leaving for vacation tomorrow and would do it when he got back. RN continued to stress the importance of getting it done before he left for his vacation. All questions were answered and PT and RN signed paperwork. Pt left via wheelchair.

## 2014-08-17 NOTE — Progress Notes (Signed)
Patient ID: Erik Adams, male   DOB: Jul 09, 1945, 69 y.o.   MRN: [id number]  [facility] PROGRESS NOTE  Erik Adams KBT:[id number] DOB: 09-05-1945 DOA: 08/15/2014 PCP: No PCP Per Patient   Brief narrative:    BRIEF DESCRIPTION: 69 yo male with remote hx of smoking presented with progressive dyspnea from acute pulmonary edema and HTN emergency (BP 206/115). He has hx of CAD s/p stent, HTN, combined CHF (EF 35 to 40% from 12/06/13), A fib, CKD, CVA. In emergency department, patient started on nitroglycerin drip and Lasix IV, also required placement on BiPAP due to oxygen saturations in 80s.  SIGNIFICANT EVENTS: 7/04 admit 2022-08-21 transfer to telemetry bed, TRH took over from PCCM   Assessment/Plan:    Active Problems:   Acute hypoxic respiratory failure secondary to acute on chronic pulmonary edema, combined systolic and diastolic heart failure, class IV - Respiratory status stable this morning, still requiring oxygen via nasal cannula - Continue Lasix 80 mg by mouth daily - Continue to follow-up on cardiology recommendations    Hypertensive emergency in patient with known bilateral renal artery stenosis - Patient currently on Coreg 25 mg PO twice a day, hydralazine 100 mg PO 3 times a day, isosorbide mononitrate 60 mg PO twice a day,  Lasix 80 mg PO daily  - Blood pressure better controlled, continuing same regimen  - Please note patient is also receiving hydralazine as needed for systolic blood pressure > 155     Bilateral renal artery stenosis - outpatient work up with Dr. Allyson Sabal    Acute on chronic combined systolic and diastolic heart failure, NYHA class 4 - Weight stable at 149 lbs, which is down from 165 lbs on admission  - Continue to monitor daily weights, strict I/O - appreciate cardiology team assistance, continuing Lasix     Hx of A fib, HLD, CAD s/p stent. - Continue aspirin, amiodarone, hydralazine  - Also continue Crestor for hyperlipidemia  - Continue  Coumadin per pharmacy     Elevated troponin - from demand ischemia in the setting of the above, HTN emergency and acute and chronic combined CHF - appreciate cardiology team following     CKD stage 3 - Creatinine remains stable at 2, at baseline Cr now 1.7 - 2.1 - Repeat BMP in the morning    Hypokalemia - In the setting of Lasix use - Continue to supplement and repeat BMP in the morning    Hypothyroidism - Continue Synthroid    Acute encephalopathy 2nd to hypoxia, and HTN emergency  - Improved with treatment of pulmonary vascular congestion and hypertensive emergency - Mental status at baseline this morning  DVT prophylaxis - on Coumadin per pharmacy   Code Status: Full.  Family Communication:  plan of care discussed with the patient Disposition Plan: Home when stable.   IV access:  Peripheral IV  Procedures and diagnostic studies:    Dg Chest Port 1 View Aug 21, 2014  Improving bibasilar aeration. 2. Pulmonary edema on admission radiography has cleared.    Dg Chest Port 1 View 08/16/2014  Improved pulmonary edema. 2. Worsening left lower lobe aeration with rapid development suggesting atelectasis.     Dg Chest Port 1 View 08/15/2014   CHF.     Medical Consultants:  Cardiology   Other Consultants:  PT  IAnti-Infectives:   None  Debbora Presto, MD  [facility] Pager [phone]  If 7PM-7AM, please contact night-coverage [email or web address] Password [email or web address] 08-21-2014, 11:19 AM   LOS: 2 days   HPI/Subjective:  No events overnight.   Objective: Filed Vitals:   08/17/14 0544 08/17/14 0800 08/17/14 0812 08/17/14 1021  BP: 169/70 186/84 186/84 158/74  Pulse: 68 69 70 59  Temp: 98.8 F (37.1 C) 99 F (37.2 C)  98.2 F (36.8 C)  TempSrc:  Core (Comment)  Core (Comment)  Resp: Height:      Weight:      SpO2: 96% 95%  96%    Intake/Output Summary (Last 24 hours) at 08/17/14 1119 Last data filed at 08/17/14 0900  Gross per 24 hour  Intake     60 ml  Output    2275 ml  Net  -2215 ml    Exam:   General:  Pt is alert, follows commands appropriately, not in acute distress  Cardiovascular: Regular rate and rhythm, S1/S2, no rubs, no gallops  Respiratory: Clear to auscultation bilaterally, no wheezing, no crackles, no rhonchi  Abdomen: Soft, non tender, non distended, bowel sounds present, no guarding   Data Reviewed: Basic Metabolic Panel:  Recent Labs Lab 08/15/14 0628 08/15/14 0632 08/16/14 0350 08/16/14 1515 08/17/14 0350  NA 138 137 134* 133* 135  K 4.8 4.7 3.1* 3.6 3.8  CL 104 103 96* 94* 97*  CO2 24  --  GLUCOSE 114* 114* 113* 117* 102*  BUN 34* 37* 33* 34* 39*  CREATININE 1.54* 1.70* 2.01* 2.12* 2.12*  CALCIUM 8.9  --  8.6* 8.8* 8.8*  MG  --   --  1.9 2.1 1.9   CBC:  Recent Labs Lab 08/15/14 0628 08/15/14 0632 08/16/14 0350 08/17/14 0350  WBC 10.7*  --  8.3 7.5  HGB 11.8* 12.9* 10.6* 11.6*  HCT 36.6* 38.0* 33.5* 36.0*  MCV 93.1  --  90.1 90.9  PLT 255  --  199 201   Cardiac Enzymes:  Recent Labs Lab 08/15/14 1220 08/15/14 1915  TROPONINI 0.31* 0.33*   Recent Results (from the past 240 hour(s))  MRSA PCR Screening     Status: None   Collection Time: 08/15/14  9:52 AM  Result Value Ref Range Status   MRSA by PCR NEGATIVE NEGATIVE Final    Scheduled Meds: . amiodarone  200 mg Oral Daily  . aspirin  81 mg Oral Daily  . carvedilol  25 mg Oral BID WC  . doxazosin  1 mg Oral QHS  . [START ON 08/18/2014] furosemide  80 mg Oral Daily  . hydrALAZINE  100 mg Oral TID  . isosorbide mononitrate  60 mg Oral BID  . levothyroxine  50 mcg Oral QAC breakfast  . rosuvastatin  20 mg Oral Daily  . Warfarin - Pharmacist Dosing Inpatient   Does not apply q1800   Continuous Infusions:

## 2014-08-17 NOTE — Telephone Encounter (Signed)
TOC call to patient no answer.LMTC. °

## 2014-08-18 ENCOUNTER — Telehealth: Payer: Self-pay | Admitting: Cardiovascular Disease

## 2014-08-18 NOTE — Telephone Encounter (Signed)
TOC call to patient- no answer , left message on answer machine to call bback

## 2014-08-18 NOTE — Telephone Encounter (Signed)
Closed encounter °

## 2014-08-24 NOTE — Progress Notes (Signed)
Cardiology Office Note   Date:  08/25/2014   ID:  Erik Adams, DOB [birth year], MRN [id number]  PCP:  No PCP Per Patient  Cardiologist:  Dr. Nicki Guadalajara     Chief Complaint  Patient presents with  . Congestive Heart Failure  . Hospitalization Follow-up     History of Present Illness: Erik Adams is a 69 y.o. male with a hx of CAD, ischemic cardiomyopathy, combined systolic and diastolic HF, HTN, HL, prior stroke, CKD, atrial fibrillation (previously documented left atrial thrombus). He underwent bare metal stenting to the LAD in 2000 and DES to the LAD and RI in 04/2010. Ejection fraction previously 25% with improvement of 35-40%. Patient has been unable to tolerate ACE inhibitor secondary to CKD. Last seen by Dr. Tresa Endo 06/2014. Renal arterial Dopplers were ordered secondary to difficult to control hypertension. This demonstrated right renal artery occlusion and he has been referred to Dr. Allyson Sabal.  He was admitted 7/4-7/6 with acute on chronic combined systolic and diastolic CHF with pulmonary edema in the setting of hypertensive emergency related to renal artery stenosis. He improved with diuresis and was discharged on a combination of carvedilol, hydralazine, nitrates, Lasix. Weight decreased from 165-149 pounds. He did have elevated troponins to be related to demand ischemia. He was followed by cardiology. Plan was to follow-up with Dr. Allyson Sabal after discharge to discuss intervention on his renal artery stenosis. He actually sees Dr. Allyson Sabal tomorrow.  He returns for FU.     Recommendations for Outpatient Follow-up:  1. Pt will need to follow up with PCP in 2-3 weeks post discharge 2. Please obtain BMP to evaluate electrolytes and kidney function 3. Please also check CBC to evaluate Hg and Hct levels 4. Please also note that pt was advised to have PT/INR checked in AM so that he can have recommendations provided for coumadin dose 5. Pt has insisted on going home or leaving AMA,  asked nurse to schedule an appointment with PCP in 1-2 days the latest for PT/INR check    Studies/Reports Reviewed Today:  Echo 08/16/14 Mod LVH, EF 35-40%, diff HK, Gr 1 DD Mild AI Mild MR Mild to mod LAE Mod to severe RAE Mod TR, PASP 56 mmHg L pleural effusion  Renal Art [place] 08/04/14 R RA occluded L RA 1-59%  Myoview 02/2012 IMPRESSION: 1. LV systolic dysfunction with EF 32% and inferior/inferolateral hypokinesis. 2. Fixed medium-sized, moderate basal inferior/inferolateral/inferoseptal and mid inferolateral perfusion defect suggestive of prior myocardial infarction without significant ischemia. 3. Intermediate risk study given depressed EF.  LHC 04/04/10 LM:  Luminal irregs LAD:  prox 25%, stent 90% ISR, D1 small 80% RI:  Mid 80% LCx:  Irregs; OM2 mid to dist 80-90% RCA:  Irregs; PLB small with 80-90% EF 65%  PCI 05/02/10 Ion DES to mid LAD and Ion DES to prox to mid RI   Past Medical History  Diagnosis Date  . CAD (coronary artery disease)     myoview 03/02/12-EF 32%, mod fixed defect in the inf wall without significant ischemia; a. s/p BMS to mLAD 2000;  b. LHC 3/12:  sig ISR mLAD, RI 80-90%, Dx1 90% (too small for PCI);  PCI:  ION DES to mLAD, ION DES to RI  . HTN (hypertension)   . Hypercholesteremia   . CVA (cerebral infarction)   . CRI (chronic renal insufficiency)     Baseline creatinine of 1.4 to 1.7  . 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 Atrial fibrillation  11/13/2011    coumadin and amiodarone; s/p DCCV November 2013; repeat DCCV Feb 13, 2012  . Left atrial thrombus     TEE 11/2011  . Iron deficiency anemia   . Cardiomyopathy, ischemic     echo 02/28/12-Ef 25%, mod mr, mild-mod TR, mod pul htn; refuses ICD  . CHF (congestive heart failure)     Past Surgical History  Procedure Laterality Date  . Coronary angioplasty with stent placement  2000    BMS to the LAD  . Mastoidectomy    . Coronary angioplasty with stent placement  March  2012    DES to LAD and Ramus intermdius  . Tee without cardioversion  12/06/2011    Procedure: TRANSESOPHAGEAL ECHOCARDIOGRAM (TEE);  Surgeon: Pricilla Riffle, MD;  Location: [facility] ENDOSCOPY;  Service: Cardiovascular;  Laterality: N/A;  . Cardioversion  01/06/2012    Procedure: CARDIOVERSION;  Surgeon: Wendall Stade, MD;  Location: [facility] ENDOSCOPY;  Service: Cardiovascular;  Laterality: N/A;  call anesthesia  maybe able to do cardio. early at 12:00  . Cardioversion  02/13/2012    Procedure: CARDIOVERSION;  Surgeon: Cassell Clement, MD;  Location: [facility] ENDOSCOPY;  Service: Cardiovascular;  Laterality: N/A;  . Tonsillectomy    . Cholecystectomy  1999  . Left inguinal hernia repair  02/2007  . Hernia repair    . Coronary angioplasty with stent placement  04/2000    DES to LAD and ramus intermediate vessel     Current Outpatient Prescriptions  Medication Sig Dispense Refill  . amiodarone (PACERONE) 200 MG tablet Take 200 mg by mouth daily.    Marland Kitchen aspirin 81 MG chewable tablet Chew 1 tablet (81 mg total) by mouth daily.    . carvedilol (COREG) 25 MG tablet Take 25 mg by mouth 2 (two) times daily with a meal.    . doxazosin (CARDURA) 1 MG tablet Take 1 tablet (1 mg total) by mouth at bedtime. 30 tablet 6  . furosemide (LASIX) 40 MG tablet     . furosemide (LASIX) 80 MG tablet Take 1 tablet (80 mg total) by mouth daily. 30 tablet 0  . hydrALAZINE (APRESOLINE) 50 MG tablet Take 100 mg by mouth 3 (three) times daily.     . isosorbide mononitrate (IMDUR) 60 MG 24 hr tablet Take 60 mg by mouth 2 (two) times daily.    Marland Kitchen ketorolac (ACULAR) 0.5 % ophthalmic solution Place 1 drop into the left eye 4 (four) times daily.    Marland Kitchen levothyroxine (SYNTHROID, LEVOTHROID) 50 MCG tablet Take 1 tablet (50 mcg total) by mouth daily before breakfast. 30 tablet 6  . nitroGLYCERIN (NITROSTAT) 0.4 MG SL tablet Place 1 tablet (0.4 mg total) under the tongue every 5 (five) minutes x 3 doses as needed. For chest pain 25 tablet 6  .  potassium chloride SA (K-DUR,KLOR-CON) 20 MEQ tablet Take 1 tablet (20 mEq total) by mouth daily. 30 tablet 6  . rosuvastatin (CRESTOR) 20 MG tablet Take 1 tablet (20 mg total) by mouth daily. 30 tablet 6  . warfarin (COUMADIN) 1 MG tablet Please do not take Coumadin until you see PCP as your INR is 3.52, You must see your PCP in 1 day to have PT/INR checked 10 tablet 0   No current facility-administered medications for this visit.    Allergies:   Amlodipine; Ace inhibitors; and Clonidine derivatives    Social History:  The patient  reports that he quit smoking about 40 years ago. His smoking use included Cigarettes. He does  not have any smokeless tobacco history on file. He reports that he does not drink alcohol or use illicit drugs.   Family History:  The patient's family history includes Heart disease in his father.    ROS:   Please see the history of present illness.   ROS    PHYSICAL EXAM: VS:  There were no vitals taken for this visit.    Wt Readings from Last 3 Encounters:  08/17/14 149 lb 4 oz (67.7 kg)  07/05/14 163 lb 3.2 oz (74.027 kg)  04/11/14 165 lb (74.844 kg)     GEN: Well nourished, well developed, in no acute distress HEENT: normal Neck: no JVD, no carotid bruits, no masses Cardiac:  Normal S1/S2, RRR; no murmur ,  no rubs or gallops, no edema  Respiratory:  clear to auscultation bilaterally, no wheezing, rhonchi or rales. GI: soft, nontender, nondistended, + BS MS: no deformity or atrophy Skin: warm and dry  Neuro:  CNs II-XII intact, Strength and sensation are intact Psych: Normal affect   EKG:  EKG is ordered today.  It demonstrates:      Recent Labs: 04/11/2014: ALT 11 07/04/2014: TSH 0.904 08/16/2014: B Natriuretic Peptide 905.7* 08/17/2014: BUN 39*; Creatinine, Ser 2.12*; Hemoglobin 11.6*; Magnesium 1.9; Platelets 201; Potassium 3.8; Sodium 135     Recent Labs  08/15/14 0630 08/15/14 1220 08/15/14 1915  TROPONINI  --  0.31* 0.33*  TROPIPOC  0.13*  --   --      Lipid Panel    Component Value Date/Time   CHOL 178 04/11/2014 1333   TRIG 98 04/11/2014 1333   HDL 40 04/11/2014 1333   CHOLHDL 4.5 04/11/2014 1333   VLDL 20 04/11/2014 1333   LDLCALC 118* 04/11/2014 1333      ASSESSMENT AND PLAN:  Chronic combined systolic and diastolic heart failure  Hypertensive heart disease with heart failure  Bilateral renal artery stenosis  Paroxysmal atrial fibrillation  Hypercholesteremia  Coronary artery disease involving native coronary artery of native heart without angina pectoris  Cardiomyopathy, ischemic  CKD (chronic kidney disease) stage 3, GFR 30-59 ml/min     Medication Changes: Current medicines are reviewed at length with the patient today.  Concerns regarding medicines are as outlined above.  The following changes have been made:   Discontinued Medications   No medications on file   Modified Medications   No medications on file   New Prescriptions   No medications on file     Labs/ tests ordered today include:   No orders of the defined types were placed in this encounter.     Disposition:   FU with    Signed, Tereso Newcomer, PA-C, MHS 08/25/2014 8:34 AM    [facility] [address] [place], [place], [place]  [address] Phone: [phone]; Fax: [phone]    This encounter was created in error - please disregard.

## 2014-08-25 ENCOUNTER — Telehealth: Payer: Self-pay | Admitting: Cardiovascular Disease

## 2014-08-25 ENCOUNTER — Other Ambulatory Visit: Payer: Medicare Other

## 2014-08-25 ENCOUNTER — Encounter: Payer: Medicare Other | Admitting: Physician Assistant

## 2014-08-25 DIAGNOSIS — I119 Hypertensive heart disease without heart failure: Secondary | ICD-10-CM | POA: Insufficient documentation

## 2014-08-25 NOTE — Telephone Encounter (Signed)
Received records from [facility] for appointment with Dr Allyson Sabal on 08/26/14.  Records given to D Jyl Heinz (medical records) for Dr Hazle Coca schedule on 08/26/14. lp

## 2014-08-26 ENCOUNTER — Encounter: Payer: Medicare Other | Admitting: Cardiovascular Disease

## 2014-08-26 ENCOUNTER — Encounter: Payer: Self-pay | Admitting: Physician Assistant

## 2014-08-26 ENCOUNTER — Ambulatory Visit: Payer: Medicare Other | Admitting: Pharmacist Clinician (PhC)/ Clinical Pharmacy Specialist

## 2014-08-30 ENCOUNTER — Telehealth: Payer: Self-pay | Admitting: Cardiovascular Disease

## 2014-08-30 NOTE — Telephone Encounter (Signed)
Closed encounter °

## 2014-09-02 ENCOUNTER — Ambulatory Visit: Payer: Medicare Other | Admitting: Pharmacist Clinician (PhC)/ Clinical Pharmacy Specialist

## 2014-09-09 ENCOUNTER — Ambulatory Visit: Payer: Medicare Other | Admitting: Pharmacist Clinician (PhC)/ Clinical Pharmacy Specialist

## 2014-09-09 ENCOUNTER — Ambulatory Visit (INDEPENDENT_AMBULATORY_CARE_PROVIDER_SITE_OTHER): Payer: Medicare Other | Admitting: Pharmacist Clinician (PhC)/ Clinical Pharmacy Specialist

## 2014-09-09 DIAGNOSIS — I5189 Other ill-defined heart diseases: Secondary | ICD-10-CM

## 2014-09-09 DIAGNOSIS — I513 Intracardiac thrombosis, not elsewhere classified: Secondary | ICD-10-CM

## 2014-09-09 DIAGNOSIS — Z7901 Long term (current) use of anticoagulants: Secondary | ICD-10-CM

## 2014-09-09 DIAGNOSIS — I4891 Unspecified atrial fibrillation: Secondary | ICD-10-CM | POA: Diagnosis not present

## 2014-09-09 LAB — POCT INR: INR: 1.2

## 2014-09-12 ENCOUNTER — Other Ambulatory Visit: Payer: Self-pay | Admitting: Pharmacist Clinician (PhC)/ Clinical Pharmacy Specialist

## 2014-09-15 ENCOUNTER — Ambulatory Visit (INDEPENDENT_AMBULATORY_CARE_PROVIDER_SITE_OTHER): Payer: Medicare Other | Admitting: Cardiovascular Disease

## 2014-09-15 ENCOUNTER — Ambulatory Visit (INDEPENDENT_AMBULATORY_CARE_PROVIDER_SITE_OTHER): Payer: Medicare Other | Admitting: Pharmacist Clinician (PhC)/ Clinical Pharmacy Specialist

## 2014-09-15 ENCOUNTER — Encounter: Payer: Self-pay | Admitting: Cardiovascular Disease

## 2014-09-15 VITALS — BP 160/90 | HR 72 | Ht 70.0 in | Wt 150.4 lb

## 2014-09-15 DIAGNOSIS — N183 Chronic kidney disease, stage 3 unspecified: Secondary | ICD-10-CM

## 2014-09-15 DIAGNOSIS — I513 Intracardiac thrombosis, not elsewhere classified: Secondary | ICD-10-CM

## 2014-09-15 DIAGNOSIS — I5189 Other ill-defined heart diseases: Secondary | ICD-10-CM | POA: Diagnosis not present

## 2014-09-15 DIAGNOSIS — Z79899 Other long term (current) drug therapy: Secondary | ICD-10-CM | POA: Diagnosis not present

## 2014-09-15 DIAGNOSIS — I4891 Unspecified atrial fibrillation: Secondary | ICD-10-CM

## 2014-09-15 DIAGNOSIS — I2583 Coronary atherosclerosis due to lipid rich plaque: Secondary | ICD-10-CM

## 2014-09-15 DIAGNOSIS — Z7901 Long term (current) use of anticoagulants: Secondary | ICD-10-CM | POA: Diagnosis not present

## 2014-09-15 DIAGNOSIS — I10 Essential (primary) hypertension: Secondary | ICD-10-CM

## 2014-09-15 DIAGNOSIS — I701 Atherosclerosis of renal artery: Secondary | ICD-10-CM

## 2014-09-15 DIAGNOSIS — I429 Cardiomyopathy, unspecified: Secondary | ICD-10-CM | POA: Diagnosis not present

## 2014-09-15 DIAGNOSIS — I2510 Atherosclerotic heart disease of native coronary artery without angina pectoris: Secondary | ICD-10-CM

## 2014-09-15 DIAGNOSIS — I255 Ischemic cardiomyopathy: Secondary | ICD-10-CM

## 2014-09-15 DIAGNOSIS — I110 Hypertensive heart disease with heart failure: Secondary | ICD-10-CM

## 2014-09-15 DIAGNOSIS — R0602 Shortness of breath: Secondary | ICD-10-CM

## 2014-09-15 DIAGNOSIS — I509 Heart failure, unspecified: Secondary | ICD-10-CM

## 2014-09-15 LAB — POCT INR: INR: 2

## 2014-09-15 MED ORDER — DIGOXIN 125 MCG PO TABS: 0.0625 mg | ORAL_TABLET | Freq: Every day | ORAL | Status: AC

## 2014-09-15 MED ORDER — AMIODARONE HCL 200 MG PO TABS: 300.0000 mg | ORAL_TABLET | Freq: Every day | ORAL | Status: DC

## 2014-09-15 NOTE — Patient Instructions (Signed)
Your physician has recommended you make the following change in your medication: the amiodarone has been increased to 300 mg daily ( 1 & 1/2 tablet).   Start new prescription for lanoxin. Take as directed on the bottle. This has already been sent to your pharmacy.  Your physician recommends that you return for lab work in: 1 week.  Your physician recommends that you schedule a follow-up appointment in: 2 months.

## 2014-09-18 ENCOUNTER — Encounter: Payer: Self-pay | Admitting: Cardiovascular Disease

## 2014-09-18 NOTE — Progress Notes (Signed)
Patient ID: Erik Adams, male   DOB: 01-12-1946, 69 y.o.   MRN: [id number]     HPI: Erik Adams is a 69 y.o. male who presents to the office today for a 3 month followup of his ischemic cardiomyopathy.  Erik Adams has a history of an ischemic cardiomyopathy. In 2000 he underwent bare-metal stenting to his LAD and in March 2012 underwent DES stenting to his LAD and ramus intermediate vessel. 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 In January 2014 he was hospitalized with increasing shortness of breath and was profoundly hypoxic, febrile and required high level support. Ejection fraction on Myoview imaging was 32% an echo Doppler assessment was 25% with restrictive physiology. I initially saw him in March 2014 to establish care with me. Athat time, I attempted to start bidil rather than the hydralazine and isosorbide that he was intermittently taking. I saw him in followup on 06/04/2012 tried again to further consolidate his medications. At that time I further titrate his Bidil to 2 tablets twice a day for 2 weeks and then recommended he increase this to 2 tablets 3 times a day.  I also discusses potential need for potential future defibrillator insertion but at the time he was pretty adamant against having this.  I last saw him a year ago.  He had had confusion with his medications.  He did see Erik Adams.D. in followup to assist with his medication compliance.  Lat year I recommended further titration of his hydralazine to 75 mg 3 times a day.  I also recommended reassessment of his LV function on echo Doppler study which was done on 12/06/2013.  This revealed improvement in LV function tests that his ejection fraction was 35-40%.  His LV cavity was moderately dilated.  There was severe left ventricular  hypertrophy.  There was grade 1 diastolic dysfunction, mild aortic insufficiency and mild mitral regurgitation.  Left atrium was moderately dilated.    He denies recent chest pain.  He denies significant shortness of breath.  He typically goes to bed at 3 AM but wakes up around noon.  [name] has had issues with confusion with reference to his medications.  When I last saw him, he stated that he was taking hydralazine 75 mg 3 times a day, isosorbide 60 mg twice a day, Crestor 20 g daily, Cardura 1 mg at bedtime, carvedilol 25 mg twice a day amiodarone 200 mg daily and  Coumadin. At that time, he had stage II hypertension and I recommended further titration of his hydralazine to 100 mg 3 times a day and it is very low-dose diltiazem CD 120 mg at bedtime.  He subsequently was admitted to the hospital  From July 4 through 08/17/2014 with hypertensive emergency with a blood pressure of 206/115.  He was felt to have acute on chronic combined systolic and diastolic heart failure.  He has documented bilateral renal artery stenosis with occlusion of the right renal artery and moderate stenosis in the left renal artery.  An echo Doppler study on 08/16/2014 showed an EF of 35-40% with diffuse hypokinesis and grade 1 diastolic dysfunction.  He had mild aortic insufficiency and mild mitral regurgitation.  There was biatrial enlargement.  Had moderate hypertension with a PA pressure 56 mm.  He also left pleural effusion.   He presents to the office  today for follow-up evaluation and he comes with his daughter.  Past Medical History  Diagnosis Date  . CAD (coronary artery disease)     myoview 03/02/12-EF 32%, mod fixed defect in the inf wall without significant ischemia; a. s/p BMS to mLAD 2000;  b. LHC 3/12:  sig ISR mLAD, RI 80-90%, Dx1 90% (too small for PCI);  PCI:  ION DES to mLAD, ION DES to RI  . HTN (hypertension)   . Hypercholesteremia   . CVA (cerebral infarction)   . CRI (chronic renal insufficiency)       Baseline creatinine of 1.4 to 1.7  . 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 Atrial fibrillation 11/13/2011    coumadin and amiodarone; s/p DCCV November 2013; repeat DCCV Feb 13, 2012  . Left atrial thrombus     TEE 11/2011  . Iron deficiency anemia   . Cardiomyopathy, ischemic     echo 02/28/12-Ef 25%, mod mr, mild-mod TR, mod pul htn; refuses ICD  . CHF (congestive heart failure)     Past Surgical History  Procedure Laterality Date  . Coronary angioplasty with stent placement  2000    BMS to the LAD  . Mastoidectomy    . Coronary angioplasty with stent placement  March 2012    DES to LAD and Ramus intermdius  . Tee without cardioversion  12/06/2011    Procedure: TRANSESOPHAGEAL ECHOCARDIOGRAM (TEE);  Surgeon: Fay Records, MD;  Location: [facility] ENDOSCOPY;  Service: Cardiovascular;  Laterality: N/A;  . Cardioversion  01/06/2012    Procedure: CARDIOVERSION;  Surgeon: Josue Hector, MD;  Location[facility] ENDOSCOPY;  Service: Cardiovascular;  Laterality: N/A;  call anesthesia  maybe able to do cardio. early at 12:00  . Cardioversion  02/13/2012    Procedure: CARDIOVERSION;  Surgeon: Darlin Coco, MD;  Location: [facility] ENDOSCOPY;  Service: Cardiovascular;  Laterality: N/A;  . Tonsillectomy    . Cholecystectomy  1999  . Left inguinal hernia repair  02/2007  . Hernia repair    . Coronary angioplasty with stent placement  04/2000    DES to LAD and ramus intermediate vessel    Allergies  Allergen Reactions  . Amlodipine Shortness Of Breath, Swelling and Palpitations    "Caused  Heart condition-per patient"  . Ace Inhibitors Other (See Comments)    Unknown reaction  . Clonidine Derivatives Other (See Comments)    Unknown reaction    Current Outpatient Prescriptions  Medication Sig Dispense Refill  . aspirin 81 MG chewable tablet Chew 1 tablet (81 mg total) by mouth daily.    . carvedilol (COREG) 25 MG tablet Take 25 mg by mouth 2 (two) times daily with a meal.    .  doxazosin (CARDURA) 1 MG tablet Take 1 tablet (1 mg total) by mouth at bedtime. 30 tablet 6  . furosemide (LASIX) 40 MG tablet     . hydrALAZINE (APRESOLINE) 50 MG tablet Take 100 mg by mouth 3 (three) times daily.     . isosorbide mononitrate (IMDUR) 60 MG 24 hr tablet Take 60 mg by mouth 2 (two) times daily.    Marland Kitchen ketorolac (ACULAR) 0.5 % ophthalmic solution Place 1 drop into the left eye 4 (four) times daily.    Marland Kitchen levothyroxine (SYNTHROID, LEVOTHROID) 50 MCG tablet Take 1 tablet (50 mcg total) by mouth daily before breakfast. 30 tablet 6  . nitroGLYCERIN (NITROSTAT) 0.4 MG SL tablet Place 1 tablet (0.4 mg total) under the tongue every 5 (five) minutes x  3 doses as needed. For chest pain 25 tablet 6  . potassium chloride SA (K-DUR,KLOR-CON) 20 MEQ tablet Take 1 tablet (20 mEq total) by mouth daily. 30 tablet 6  . rosuvastatin (CRESTOR) 20 MG tablet Take 1 tablet (20 mg total) by mouth daily. 30 tablet 6  . amiodarone (PACERONE) 200 MG tablet Take 1.5 tablets (300 mg total) by mouth daily. 45 tablet 6  . digoxin (LANOXIN) 0.125 MG tablet Take 0.5 tablets (0.0625 mg total) by mouth daily. 30 tablet 3  . warfarin (COUMADIN) 1 MG tablet Please do not take Coumadin until you see PCP as your INR is 3.52, You must see your PCP in 1 day to have PT/INR checked 10 tablet 0  . warfarin (COUMADIN) 5 MG tablet TAKE 1 TABLET DAILY OR AS DIRECTED. 90 tablet 0   No current facility-administered medications for this visit.    Socially he is widowed has 5 children 9 grandchildren no great-grandchildren. His wife died of brain cancer. 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 His 5 brothers 2 sisters and 5 children.   ROS General: Negative; No fevers, chills, or night sweats; weakness on his feet HEENT: Negative; No changes in vision or hearing, sinus congestion, difficulty swallowing Pulmonary: Negative; No cough, wheezing, shortness of breath, hemoptysis Cardiovascular:  See history  of present illness GI: Negative; No nausea, vomiting, diarrhea, or abdominal pain GU: Negative; No dysuria, hematuria, or difficulty voiding Musculoskeletal: Negative; no myalgias, joint pain, or weakness Hematologic/Oncology: Negative; no easy bruising, bleeding Endocrine: Negative; no heat/cold intolerance; no diabetes Neuro: Negative; no changes in balance, headaches Skin: Negative; No rashes or skin lesions Psychiatric: Negative; No behavioral problems, depression Sleep: Negative; No snoring, daytime sleepiness, hypersomnolence, bruxism, restless legs, hypnogognic hallucinations, no cataplexy Other comprehensive 14 point system review is negative.  PE BP 160/90 mmHg  Pulse 72  Ht $R'5\' 10"'cH$  (1.778 m)  Wt 150 lb 6.4 oz (68.221 kg)  BMI 21.58 kg/m2  Repeat blood pressure by me was 152/90 when taken by me.  Wt Readings from Last 3 Encounters:  09/15/14 150 lb 6.4 oz (68.221 kg)  08/17/14 149 lb 4 oz (67.7 kg)  07/05/14 163 lb 3.2 oz (74.027 kg)   General: Alert, oriented, no distress.  Skin: normal turgor, no rashes HEENT: Normocephalic, atraumatic. Pupils round and reactive; sclera anicteric;no lid lag.  Nose without nasal septal hypertrophy Mouth/Parynx benign; Mallinpatti scale 3  Neck: No JVD, no carotid bruits with normal carotid upstroke Lungs: clear to ausculatation and percussion; no wheezing or rales Chest wall: Nontender to palpation Heart:  Irregular regular rhythm with a controlled rate in the 70s, s1 s2 normal 1/6 systolic murmur.  No diastolic murmur.  No rubs, thrills or heaves; No S3 gallop. Abdomen: soft, nontender; no hepatosplenomehaly, BS+; abdominal aorta nontender and not dilated by palpation. Back: No CVA tenderness Pulses 2+ Extremities: no clubbing cyanosis or edema, Homan's sign negative  Neurologic: grossly nonfocal Psychological: Normal affect and mood  ECG (independently read by me):  Atrial fibrillation at 72 bpm. LVH with QRS widening. Lateral  T-wave changes.  May ECG (independently read by me): Normal sinus rhythm.  Borderline first-degree AV block.  LVH with QRS widening and T-wave inversion in leads 1 aVL, V5 and V6.  February 2016 ECG (independently read by me): Normal sinus rhythm at 64 bpm.  LVH with probable repolarization changes.  T-wave inversion in leads 1 and L, V4 through V6.  QTc interval 474 ms.  01/04/2014  ECG (independently read by me): Normal sinus rhythm at 66..  First degree AV block.  LVH with QRS widening and repolarization changes.  QTc interval 470 ms.  Prior ECG (independently read by me): Normal sinus rhythm.  Borderline first-degree AV block with PR interval 204 ms.  LVH with repolarization changes.  Prior ECG normal sinus rhythm with first-degree AV block the there is evidence for LVH with QRS widening and repolarization changes. PR interval 210 ms QTc interval 499 ms.  LABS:  BMET  BMP Latest Ref Rng 08/17/2014 08/16/2014 08/16/2014  Glucose 65 - 99 mg/dL 102(H) 117(H) 113(H)  BUN 6 - 20 mg/dL 39(H) 34(H) 33(H)  Creatinine 0.61 - 1.24 mg/dL 2.12(H) 2.12(H) 2.01(H)  Sodium 135 - 145 mmol/L 135 133(L) 134(L)  Potassium 3.5 - 5.1 mmol/L 3.8 3.6 3.1(L)  Chloride 101 - 111 mmol/L 97(L) 94(L) 96(L)  CO2 22 - 32 mmol/L $RemoveB'28 30 30  'YNoUBGAR$ Calcium 8.9 - 10.3 mg/dL 8.8(L) 8.8(L) 8.6(L)    Hepatic Function Panel   Hepatic Function Latest Ref Rng 04/11/2014 01/04/2014 02/27/2012  Total Protein 6.0 - 8.3 g/dL 5.7(L) 6.4 6.6  Albumin 3.5 - 5.2 g/dL 3.8 4.1 3.4(L)  AST 0 - 37 U/L 18 24 36  ALT 0 - 53 U/L 11 19 33  Alk Phosphatase 39 - 117 U/L 53 72 93  Total Bilirubin 0.2 - 1.2 mg/dL 0.6 0.7 1.0  Bilirubin, Direct 0.0 - 0.3 mg/dL - - -    CBC   CBC Latest Ref Rng 08/17/2014 08/16/2014 08/15/2014  WBC 4.0 - 10.5 K/uL 7.5 8.3 -  Hemoglobin 13.0 - 17.0 g/dL 11.6(L) 10.6(L) 12.9(L)  Hematocrit 39.0 - 52.0 % 36.0(L) 33.5(L) 38.0(L)  Platelets 150 - 400 K/uL 201 199 -    BNP    Component Value Date/Time   PROBNP  1711.0* 03/18/2012 1641    Lipid Panel     Component Value Date/Time   CHOL 178 04/11/2014 1333   TRIG 98 04/11/2014 1333   HDL 40 04/11/2014 1333   CHOLHDL 4.5 04/11/2014 1333   VLDL 20 04/11/2014 1333   LDLCALC 118* 04/11/2014 1333   INR checked today in the office elevated at 3.2  RADIOLOGY: No results found.   ASSESSMENT AND PLAN:   Erik Adams is a 69 year old gentleman with an ischemic cardiomyopathy who is status post stenting of his LAD and ramus intermediate vessel. Has a history of PAF and remotely documented left atrial  thrombus for which he has been on warfarin anticoagulation.  He has a history of renal insufficiency, hypertension, hyperlipidemia , and remote CVA. Previously he was documented to [facility] restrictive physiology with an EF of 25% on echo Doppler assessment and has a moderate fixed defect in inferior wall without ischemia on Myoview imaging.  Subsequent echo has demonstrated mild improvement of LV function with an ejection fraction of 35-40%.  His LV is dilated and he is has severe LVH with grade 1 diastolic dysfunction.  In the past, he was unable to tolerate Ace inhibitors due to his renal insufficiency and renal artery stenosis.. When I saw him in the office.  He had stage II hypertension I further titrated his hydralazine.  He recently was admitted with hypertensive emergency and acute on chronic systolic and diastolic heart failure.  His blood pressure today is improved but still elevated and he now is in atrial fibrillation of questionable duration.  He is anticoagulated on Coumadin  With an INR todayat  2.0.  With his LV dysfunction I  am electing to add Lanoxin at 0.0625 mg daily  and am further increasing his amiodarone to 300 mg daily from his current dose of 200 mg.  Follow-up laboratory will be obtained in one week consisting of a CMP, TSH, BNP, digoxin level, and CBC.  I will see him in 2 months for reevaluation.     Time spent: 25 minutes   Troy Sine, MD, [facility]  09/18/2014 4:01 PM

## 2014-09-22 ENCOUNTER — Telehealth: Payer: Self-pay | Admitting: Cardiovascular Disease

## 2014-09-22 DIAGNOSIS — I6381 Other cerebral infarction due to occlusion or stenosis of small artery: Secondary | ICD-10-CM

## 2014-09-22 NOTE — Telephone Encounter (Signed)
Agree. Refer to neurology. He has had issues with accelerated hypertension, set up for lacunar infarts and potential CVA

## 2014-09-22 NOTE — Telephone Encounter (Signed)
Spoke with pt dtr, aware referral has been placed. It will faxed to [facility] and they will contact the pt.

## 2014-09-22 NOTE — Telephone Encounter (Signed)
Please call,she wants to discuss her father's office visit  with Dr Tresa Endo last week.

## 2014-09-22 NOTE — Telephone Encounter (Signed)
Spoke with pt dtr, they do not think dr Tresa Endo heard them when he was at his appt. The pt is having weakness. He is having trouble forming his words, he is not able to use the phone, his speech is slurred and the pt states his symptoms from his previous stroke are coming back. The family would like a referral to dr Darrol Angel @ [facility]. Will forward for dr [facility] okay for referral.

## 2014-09-23 ENCOUNTER — Ambulatory Visit (INDEPENDENT_AMBULATORY_CARE_PROVIDER_SITE_OTHER): Payer: Medicare Other | Admitting: Pharmacist Clinician (PhC)/ Clinical Pharmacy Specialist

## 2014-09-23 DIAGNOSIS — I4891 Unspecified atrial fibrillation: Secondary | ICD-10-CM

## 2014-09-23 DIAGNOSIS — I5189 Other ill-defined heart diseases: Secondary | ICD-10-CM

## 2014-09-23 DIAGNOSIS — Z7901 Long term (current) use of anticoagulants: Secondary | ICD-10-CM

## 2014-09-23 DIAGNOSIS — I513 Intracardiac thrombosis, not elsewhere classified: Secondary | ICD-10-CM

## 2014-09-23 LAB — POCT INR: INR: 3.6

## 2014-10-07 ENCOUNTER — Ambulatory Visit (INDEPENDENT_AMBULATORY_CARE_PROVIDER_SITE_OTHER): Payer: Medicare Other | Admitting: Pharmacist Clinician (PhC)/ Clinical Pharmacy Specialist

## 2014-10-07 DIAGNOSIS — I4891 Unspecified atrial fibrillation: Secondary | ICD-10-CM

## 2014-10-07 DIAGNOSIS — I5189 Other ill-defined heart diseases: Secondary | ICD-10-CM | POA: Diagnosis not present

## 2014-10-07 DIAGNOSIS — Z7901 Long term (current) use of anticoagulants: Secondary | ICD-10-CM | POA: Diagnosis not present

## 2014-10-07 DIAGNOSIS — I513 Intracardiac thrombosis, not elsewhere classified: Secondary | ICD-10-CM

## 2014-10-07 LAB — POCT INR: INR: 2.9

## 2014-10-28 ENCOUNTER — Ambulatory Visit (INDEPENDENT_AMBULATORY_CARE_PROVIDER_SITE_OTHER): Payer: Medicare Other | Admitting: Pharmacist Clinician (PhC)/ Clinical Pharmacy Specialist

## 2014-10-28 DIAGNOSIS — I5189 Other ill-defined heart diseases: Secondary | ICD-10-CM

## 2014-10-28 DIAGNOSIS — Z7901 Long term (current) use of anticoagulants: Secondary | ICD-10-CM | POA: Diagnosis not present

## 2014-10-28 DIAGNOSIS — I4891 Unspecified atrial fibrillation: Secondary | ICD-10-CM | POA: Diagnosis not present

## 2014-10-28 DIAGNOSIS — I513 Intracardiac thrombosis, not elsewhere classified: Secondary | ICD-10-CM

## 2014-10-28 LAB — POCT INR: INR: 2.7

## 2014-10-29 ENCOUNTER — Emergency Department (HOSPITAL_COMMUNITY): Payer: Medicare Other

## 2014-10-29 ENCOUNTER — Encounter (HOSPITAL_COMMUNITY): Payer: Self-pay | Admitting: Emergency Medicine

## 2014-10-29 ENCOUNTER — Inpatient Hospital Stay (HOSPITAL_COMMUNITY)
Admission: EM | Admit: 2014-10-29 | Discharge: 2014-10-31 | DRG: 291 | Disposition: A | Discharge status: Home / Self Care | Payer: Medicare Other | Attending: Internal Medicine | Admitting: Internal Medicine

## 2014-10-29 DIAGNOSIS — N184 Chronic kidney disease, stage 4 (severe): Secondary | ICD-10-CM | POA: Diagnosis present

## 2014-10-29 DIAGNOSIS — E039 Hypothyroidism, unspecified: Secondary | ICD-10-CM | POA: Diagnosis present

## 2014-10-29 DIAGNOSIS — I480 Paroxysmal atrial fibrillation: Secondary | ICD-10-CM | POA: Diagnosis not present

## 2014-10-29 DIAGNOSIS — E785 Hyperlipidemia, unspecified: Secondary | ICD-10-CM | POA: Diagnosis present

## 2014-10-29 DIAGNOSIS — Z7901 Long term (current) use of anticoagulants: Secondary | ICD-10-CM

## 2014-10-29 DIAGNOSIS — I10 Essential (primary) hypertension: Secondary | ICD-10-CM | POA: Diagnosis present

## 2014-10-29 DIAGNOSIS — I255 Ischemic cardiomyopathy: Secondary | ICD-10-CM | POA: Diagnosis present

## 2014-10-29 DIAGNOSIS — Z955 Presence of coronary angioplasty implant and graft: Secondary | ICD-10-CM

## 2014-10-29 DIAGNOSIS — Z87891 Personal history of nicotine dependence: Secondary | ICD-10-CM

## 2014-10-29 DIAGNOSIS — R0602 Shortness of breath: Secondary | ICD-10-CM | POA: Diagnosis not present

## 2014-10-29 DIAGNOSIS — R7989 Other specified abnormal findings of blood chemistry: Secondary | ICD-10-CM | POA: Diagnosis not present

## 2014-10-29 DIAGNOSIS — I2510 Atherosclerotic heart disease of native coronary artery without angina pectoris: Secondary | ICD-10-CM

## 2014-10-29 DIAGNOSIS — T502X5A Adverse effect of carbonic-anhydrase inhibitors, benzothiadiazides and other diuretics, initial encounter: Secondary | ICD-10-CM | POA: Diagnosis present

## 2014-10-29 DIAGNOSIS — Z7982 Long term (current) use of aspirin: Secondary | ICD-10-CM

## 2014-10-29 DIAGNOSIS — N183 Chronic kidney disease, stage 3 unspecified: Secondary | ICD-10-CM

## 2014-10-29 DIAGNOSIS — E780 Pure hypercholesterolemia: Secondary | ICD-10-CM | POA: Diagnosis present

## 2014-10-29 DIAGNOSIS — I129 Hypertensive chronic kidney disease with stage 1 through stage 4 chronic kidney disease, or unspecified chronic kidney disease: Secondary | ICD-10-CM | POA: Diagnosis present

## 2014-10-29 DIAGNOSIS — Z79899 Other long term (current) drug therapy: Secondary | ICD-10-CM

## 2014-10-29 DIAGNOSIS — G4700 Insomnia, unspecified: Secondary | ICD-10-CM | POA: Diagnosis present

## 2014-10-29 DIAGNOSIS — N179 Acute kidney failure, unspecified: Secondary | ICD-10-CM | POA: Diagnosis present

## 2014-10-29 DIAGNOSIS — Z8673 Personal history of transient ischemic attack (TIA), and cerebral infarction without residual deficits: Secondary | ICD-10-CM

## 2014-10-29 DIAGNOSIS — I509 Heart failure, unspecified: Secondary | ICD-10-CM

## 2014-10-29 DIAGNOSIS — J9601 Acute respiratory failure with hypoxia: Secondary | ICD-10-CM | POA: Diagnosis present

## 2014-10-29 DIAGNOSIS — I5043 Acute on chronic combined systolic (congestive) and diastolic (congestive) heart failure: Secondary | ICD-10-CM | POA: Diagnosis not present

## 2014-10-29 DIAGNOSIS — I701 Atherosclerosis of renal artery: Secondary | ICD-10-CM | POA: Diagnosis present

## 2014-10-29 DIAGNOSIS — Z8249 Family history of ischemic heart disease and other diseases of the circulatory system: Secondary | ICD-10-CM

## 2014-10-29 DIAGNOSIS — I4891 Unspecified atrial fibrillation: Secondary | ICD-10-CM | POA: Diagnosis present

## 2014-10-29 DIAGNOSIS — R778 Other specified abnormalities of plasma proteins: Secondary | ICD-10-CM | POA: Diagnosis present

## 2014-10-29 LAB — TROPONIN I: Troponin I: 0.17 ng/mL — ABNORMAL HIGH (ref <0.031)

## 2014-10-29 LAB — BASIC METABOLIC PANEL
Anion gap: 11 (ref 5–15)
BUN: 36 mg/dL — ABNORMAL HIGH (ref 6–20)
CHLORIDE: 97 mmol/L — AB (ref 101–111)
CO2: 27 mmol/L (ref 22–32)
CREATININE: 2.03 mg/dL — AB (ref 0.61–1.24)
Calcium: 8.7 mg/dL — ABNORMAL LOW (ref 8.9–10.3)
GFR calc Af Amer: 37 mL/min — ABNORMAL LOW (ref >60)
GFR, EST NON AFRICAN AMERICAN: 32 mL/min — AB (ref >60)
Glucose, Bld: 120 mg/dL — ABNORMAL HIGH (ref 65–99)
Potassium: 3.7 mmol/L (ref 3.5–5.1)
SODIUM: 135 mmol/L (ref 135–145)

## 2014-10-29 LAB — CBC
HCT: 33.4 % — ABNORMAL LOW (ref 39.0–52.0)
Hemoglobin: 10.3 g/dL — ABNORMAL LOW (ref 13.0–17.0)
MCH: 25.7 pg — ABNORMAL LOW (ref 26.0–34.0)
MCHC: 30.8 g/dL (ref 30.0–36.0)
MCV: 83.3 fL (ref 78.0–100.0)
Platelets: 211 10*3/uL (ref 150–400)
RBC: 4.01 MIL/uL — ABNORMAL LOW (ref 4.22–5.81)
RDW: 16.4 % — AB (ref 11.5–15.5)
WBC: 8.1 10*3/uL (ref 4.0–10.5)

## 2014-10-29 LAB — BRAIN NATRIURETIC PEPTIDE: B Natriuretic Peptide: 2688.1 pg/mL — ABNORMAL HIGH (ref 0.0–100.0)

## 2014-10-29 LAB — PROTIME-INR
INR: 2.68 — ABNORMAL HIGH (ref 0.00–1.49)
Prothrombin Time: 28.1 seconds — ABNORMAL HIGH (ref 11.6–15.2)

## 2014-10-29 LAB — DIGOXIN LEVEL

## 2014-10-29 LAB — I-STAT TROPONIN, ED: Troponin i, poc: 0.18 ng/mL (ref 0.00–0.08)

## 2014-10-29 MED ORDER — FUROSEMIDE 10 MG/ML IJ SOLN
40.0000 mg | INTRAMUSCULAR | Status: AC
  Administered 2014-10-30: 40 mg via INTRAVENOUS
  Filled 2014-10-29: qty 4

## 2014-10-29 NOTE — H&P (Signed)
[facility] History and Physical  RUSSELL QUINNEY AVW:[id number] DOB: November 22, 1945 DOA: 10/29/2014  Referring physician:  Eber Hong PCP:  No PCP Per Patient   Chief Complaint:  SOB/can't sleep  HPI:  The patient is a 69 y.o. 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  Per the cardiology notes, he has been resistant to having a defibrillator placed and he has had some problems with compliance with his medications. The patient states that his base/dry weight is 150 pounds.  He states that he has had some changes made to his medications over the last few months which included the addition of some blood pressure medications and adjustments to his blood pressure medications after hospitalization in July.  It does not appear that he has had major changes to his diuretics per hospital notes or per cardiology outpatient visit notes.  He states that he had been otherwise feeling well without fevers, signs of infection, chest pain, or palpitations when approximately 3 days ago he noticed that he had difficulty sleeping secondary to shortness of breath. He would lie down but then he would wake up Jaishaun Mcnab of breath and have to sit in the chair for a while. He states he did not sleep at all and so he increased his Lasix from 40 mg twice a day to 80 mg twice a day. He states that this caused him to void more frequently, but he had aggressive worsening of his orthopnea and PND. He denies lower extremity swelling, abdominal swelling, or  obvious weight gain.  He states he has been compliant with a low sodium diet however he also admits to eating bacon several times a week and occasional canned goods. He is also unable to tell me his list of medications.  In the emergency department, his vital signs were notable for hypertension with a systolic blood pressure 164, diastolic 104. His oxygen saturations were as low as 88% on room air. Labs, including his creatinine and his troponin were at baseline. He has a chronically elevated troponin secondary to his heart failure, heart disease, and chronic kidney disease. His BNP was 2688, and his chest x-ray demonstrated cardiomegaly with small bilateral pleural effusions and mild pulmonary vascular congestion. It also noted a 15 mm soft tissue density overlying the right lower lobe and the radiologist has recommended follow-up radiographs. His INR was therapeutic at 2.68. He was given a dose of Lasix 80 mg IV once in the emergency department and has voided a proximally 500 mL of urine.   Review of Systems:  General:  Denies fevers, chills, weight loss or gain HEENT:  Denies changes to hearing and vision, rhinorrhea, sinus congestion, sore throat CV:  Denies chest pain and palpitations, lower extremity edema.  PULM:  Denies wheezing, cough.   GI:  Denies nausea, vomiting, constipation, diarrhea.   GU:  Denies dysuria, frequency, urgency ENDO:  Denies polyuria, polydipsia.   HEME:  Denies hematemesis, blood in stools, melena, abnormal bruising or bleeding.  LYMPH:  Denies lymphadenopathy.   MSK:  Denies arthralgias, myalgias.   DERM:  Denies skin rash  or ulcer.   NEURO:  Denies focal numbness, weakness, slurred speech, confusion, facial droop.  PSYCH:  Denies anxiety and depression.    Past Medical History  Diagnosis Date  . CAD (coronary artery disease)     myoview 03/02/12-EF 32%, mod fixed defect in the inf wall without significant ischemia; a. s/p BMS to mLAD 2000;  b. LHC 3/12:  sig ISR  mLAD, RI 80-90%, Dx1 90% (too small for PCI);  PCI:  ION DES to mLAD, ION DES to RI  . HTN (hypertension)   . Hypercholesteremia   . CVA (cerebral infarction)   . CRI (chronic renal insufficiency)     Baseline creatinine of 1.4 to 1.7  . 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 Atrial fibrillation 11/13/2011    coumadin and amiodarone; s/p DCCV November 2013; repeat DCCV Feb 13, 2012  . Left atrial thrombus     TEE 11/2011  . Iron deficiency anemia   . Cardiomyopathy, ischemic     echo 02/28/12-Ef 25%, mod mr, mild-mod TR, mod pul htn; refuses ICD  . CHF (congestive heart failure)    Past Surgical History  Procedure Laterality Date  . Coronary angioplasty with stent placement  2000    BMS to the LAD  . Mastoidectomy    . Coronary angioplasty with stent placement  March 2012    DES to LAD and Ramus intermdius  . Tee without cardioversion  12/06/2011    Procedure: TRANSESOPHAGEAL ECHOCARDIOGRAM (TEE);  Surgeon: Pricilla Riffle, MD;  Location: [facility] ENDOSCOPY;  Service: Cardiovascular;  Laterality: N/A;  . Cardioversion  01/06/2012    Procedure: CARDIOVERSION;  Surgeon: Wendall Stade, MD;  Location: [facility] ENDOSCOPY;  Service: Cardiovascular;  Laterality: N/A;  call anesthesia  maybe able to do cardio. early at 12:00  . Cardioversion  02/13/2012    Procedure: CARDIOVERSION;  Surgeon: Cassell Clement, MD;  Location: [facility] ENDOSCOPY;  Service: Cardiovascular;  Laterality: N/A;  . Tonsillectomy    . Cholecystectomy  1999  . Left inguinal hernia repair  02/2007  . Hernia repair    . Coronary angioplasty with stent placement  04/2000    DES to LAD and ramus intermediate vessel   Social History:  reports that he quit smoking about 40 years ago. His smoking use included Cigarettes. He does not have any smokeless tobacco history on file. He reports that he does not drink alcohol or use illicit drugs.  Allergies  Allergen Reactions  . Amlodipine Shortness Of Breath, Swelling and  Palpitations    "Caused  Heart condition-per patient"  . Ace Inhibitors Other (See Comments)    Unknown reaction  . Clonidine Derivatives Other (See Comments)    Unknown reaction    Family History  Problem Relation Age of Onset  . Heart disease Father   . Heart failure Neg Hx      Prior to Admission medications   Medication Sig Start Date End Date Taking? Authorizing Provider  amiodarone (PACERONE) 200 MG tablet Take 1.5 tablets (300 mg total) by mouth daily. 09/15/14   Lennette Bihari, MD  aspirin 81 MG chewable tablet Chew 1 tablet (81 mg total) by mouth daily. 03/03/12   Simonne Martinet, NP  carvedilol (COREG) 25 MG tablet Take 25 mg by mouth 2 (two) times daily with a meal.    Historical Provider, MD  digoxin (LANOXIN) 0.125 MG tablet Take 0.5 tablets (0.0625 mg total) by mouth daily. 09/15/14   Lennette Bihari, MD  doxazosin (CARDURA) 1 MG tablet Take 1 tablet (1 mg total) by mouth at bedtime. 01/04/14   Lennette Bihari, MD  furosemide (LASIX) 40 MG tablet  08/18/14   Historical Provider, MD  hydrALAZINE (APRESOLINE) 50 MG tablet Take 100 mg by mouth 3 (three) times daily.  07/31/14   Historical Provider, MD  isosorbide mononitrate (IMDUR) 60 MG 24 hr tablet Take 60 mg by mouth 2 (two) times daily.    Historical Provider, MD  ketorolac (ACULAR) 0.5 % ophthalmic solution Place 1 drop into the left eye 4 (four) times daily. 08/10/14   Historical Provider, MD  levothyroxine (SYNTHROID, LEVOTHROID) 50 MCG tablet Take 1 tablet (50 mcg total) by mouth daily before breakfast. 05/05/14   Abelino Derrick, PA-C  nitroGLYCERIN (NITROSTAT) 0.4 MG SL tablet Place 1 tablet (0.4 mg total) under the tongue every 5 (five) minutes x 3 doses as needed. For chest pain 08/12/12   Lennette Bihari, MD  potassium chloride SA (K-DUR,KLOR-CON) 20 MEQ tablet Take 1 tablet (20 mEq total) by mouth daily. 04/12/14   Lennette Bihari, MD  rosuvastatin (CRESTOR) 20 MG tablet Take 1 tablet (20 mg total) by mouth daily. 02/02/14   Lennette Bihari, MD  warfarin (COUMADIN) 1 MG tablet Please do not take Coumadin until you see PCP as your INR is 3.52, You must see your PCP in 1 day to have PT/INR checked 08/17/14   Dorothea Ogle, MD  warfarin (COUMADIN) 5 MG tablet TAKE 1 TABLET DAILY OR AS DIRECTED. 09/13/14   Lennette Bihari, MD   Physical Exam: Filed Vitals:   10/29/14 2245 10/29/14 2315 10/29/14 2330 10/30/14 0015  BP: 157/88 157/99 163/99 164/104  Pulse: 104  93 97  Temp:      TempSrc:      Resp: Height:      Weight:      SpO2: 91% 96% 93% 96%     General:  Adult male, no acute distress  Eyes:  PERRL, anicteric, non-injected.  ENT:  Nares clear.  OP clear, non-erythematous without plaques or exudates.  MMM.  Neck:  Supple without TM or JVD.    Lymph:  No cervical, supraclavicular, or submandibular LAD.  Cardiovascular:  IRRR, normal S1, S2, without m/r/g.  2+ pulses, warm extremities  Respiratory:  Diminished at the bilateral bases without rales, rhonchi, or wheeze, without increased WOB.  Abdomen:  NABS.  Soft, ND/NT.    Skin:  No rashes or focal lesions.  Musculoskeletal:  Normal bulk and tone.  No LE edema.  Psychiatric:  A & O x 4.  Unusual affect with occasional giggling.  Neurologic:  CN 3-12 intact.  5/5 strength.  Sensation intact.  Labs on Admission:  Basic Metabolic Panel:  Recent Labs Lab 10/29/14 1823  NA 135  K 3.7  CL 97*  CO2 27  GLUCOSE 120*  BUN 36*  CREATININE 2.03*  CALCIUM 8.7*   Liver Function Tests: No results for input(s): AST, ALT, ALKPHOS, BILITOT, PROT, ALBUMIN in the last 168 hours. No results for input(s): LIPASE, AMYLASE in the last 168 hours. No results for input(s): AMMONIA in the last 168 hours. CBC:  Recent Labs Lab 10/29/14 1823  WBC 8.1  HGB 10.3*  HCT 33.4*  MCV 83.3  PLT 211   Cardiac Enzymes:  Recent Labs Lab 10/29/14 2054  TROPONINI 0.17*    BNP (last 3 results)  Recent Labs  08/15/14 0629 08/16/14 0350 10/29/14  2054   BNP 1783.2* 905.7* 2688.1*    ProBNP (last 3 results) No results for input(s): PROBNP in the last 8760 hours.  CBG: No results for input(s): GLUCAP in the last 168 hours.  Radiological Exams on Admission: Dg Chest 2 View  10/29/2014   CLINICAL DATA:  Shortness of breath, cough.  EXAM: CHEST  2 VIEW  COMPARISON:  08/17/2014  FINDINGS: Cardiomediastinal silhouette is enlarged. Mediastinal contours appear intact. Thoracic aorta is torturous and contains atherosclerotic calcifications. Coronary artery stents are seen.  There is no evidence of pneumothorax. There are small bilateral pleural effusions. There is mild coarsening of the interstitial markings. There is a 15 mm opacity seen overlying the periphery of the right lower lung on the frontal view.  Osseous structures are without acute abnormality. Soft tissues are grossly normal.  IMPRESSION: Cardiomegaly.  Small bilateral pleural effusions, and mild pulmonary vascular congestion.  15 mm soft tissue density overlying the right lower lung. This may represent an area of round atelectasis, however follow-up with PA and lateral radiograph, or chest CT, is recommended to assure resolution, as subpleural soft tissue mass cannot be excluded.   Electronically Signed   By: Ted Mcalpine M.D.   On: 10/29/2014 20:12    EKG: Independently reviewed. Atrial fibrillation with ST segment elevation in V1 and deep ST segment depressions in the lateral leads, left bundle branch block, all of which are stable from prior  Assessment/Plan Principal Problem:   Acute on chronic combined systolic and diastolic heart failure, NYHA class 4 Active Problems:   CAD (coronary artery disease)   HTN (hypertension)   Atrial fibrillation  ---  Acute on chronic systolic and diastolic heart failure, unclear etiology but I suspect this may be secondary to inadvertent medication noncompliance and dietary indiscretions.  BNP 2688.   -  Telemetry -  Cycle troponins -   Lasix 80 mg IV twice a day -  Daily weights and strict ins and outs -  Resume Imdur, hydralazine, and other heart failure medications -  Trend creatinine, potassium  Abnormal RLL finding on CXR -  Repeat PA and lateral CXR  Coronary artery disease status post stents, chest pain-free, EKG stable.  He has chronically elevated troponins and this one is less than previous.   -  Continue aspirin, high-dose statin, beta blocker -  Continue nitroglycerin when necessary -  Repeat troponins every 6 hours  Atrial fibrillation, INR therapeutic -  Telemetry -  Coumadin dose per pharmacy -  Continue amiodarone, digoxin, carvedilol -  Digoxin level was low  Hx of left atrial appendage thrombus -  Chronic anticoagulation as above  Hypertension, blood pressure elevated -  Resume home medications and titrate prn -  Add prn hydralazine  History of stroke, memory deficits and abnormal affect -  Continue aspirin -  Patient has follow-up at [facility]  Hypothyroidism, stable, continue Synthroid -  Check TSH  Diet:  Low-sodium Access:  PIV IVF:  Off Proph:  Coumadin  Code Status: Full code Family Communication: Patient alone Disposition Plan: Admit to telemetry  Time spent: 60 min Renae Fickle [facility] Pager [phone]  If 7PM-7AM, please contact night-coverage [email or web address] Password [email or web address] 10/30/2014, 1:08 AM

## 2014-10-29 NOTE — ED Notes (Signed)
Pt c/o not being able to sleep and shortness of breath x 4 days.

## 2014-10-29 NOTE — ED Provider Notes (Signed)
CSN: [id number]     Arrival date & time 10/29/14  1808 History   First MD Initiated Contact with Patient 10/29/14 1859     Chief Complaint  Patient presents with  . Insomnia  . Shortness of Breath     (Consider location/radiation/quality/duration/timing/severity/associated sxs/prior Treatment) HPI Comments: The patient is a 69 year old male with a complicated medical history with regards to his heart. He has had multiple myocardial infarctions and multiple stents including 15 years ago as well as in 2012 according to the cardiology notes. He has known ischemic cardiomyopathy, he has severe left ventricular hypertrophy and diastolic dysfunction, last echocardiogram showed an ejection fraction of 35%. He is on medications for blood pressure as well as for atrial fibrillation including Coumadin, digoxin, Coreg, amiodarone and he takes nitroglycerin as needed, Imdur daily, hydralazine and Lasix. He has been increasing his dose of Lasix because he reports having increased shortness of breath at night when he tries to lay down to go to sleep. He reports that he lays in bed all night and cannot fall asleep. He does take naps every day for one or 2 hours during the daytime. This is a constant thing for him over the last week.  He reports that he has no chest pain but he does have severe dyspnea on exertion, he had the symptoms last month, this is a chronic process but seems to gradually be getting worse. He denies fevers chills coughing or swelling of the legs.  When asked about his main complaint, he states that he cannot sleep, when asked why he states because he cannot breathe.  Patient is a 69 y.o. male presenting with insomnia and shortness of breath. The history is provided by the patient and medical records.  Insomnia Associated symptoms include shortness of breath.  Shortness of Breath   Past Medical History  Diagnosis Date  . CAD (coronary artery disease)     myoview 03/02/12-EF 32%, mod  fixed defect in the inf wall without significant ischemia; a. s/p BMS to mLAD 2000;  b. LHC 3/12:  sig ISR mLAD, RI 80-90%, Dx1 90% (too small for PCI);  PCI:  ION DES to mLAD, ION DES to RI  . HTN (hypertension)   . Hypercholesteremia   . CVA (cerebral infarction)   . CRI (chronic renal insufficiency)     Baseline creatinine of 1.4 to 1.7  . 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 Atrial fibrillation 11/13/2011    coumadin and amiodarone; s/p DCCV November 2013; repeat DCCV Feb 13, 2012  . Left atrial thrombus     TEE 11/2011  . Iron deficiency anemia   . Cardiomyopathy, ischemic     echo 02/28/12-Ef 25%, mod mr, mild-mod TR, mod pul htn; refuses ICD  . CHF (congestive heart failure)    Past Surgical History  Procedure Laterality Date  . Coronary angioplasty with stent placement  2000    BMS to the LAD  . Mastoidectomy    . Coronary angioplasty with stent placement  March 2012    DES to LAD and Ramus intermdius  . Tee without cardioversion  12/06/2011    Procedure: TRANSESOPHAGEAL ECHOCARDIOGRAM (TEE);  Surgeon: Pricilla Riffle, MD;  Location: [facility] ENDOSCOPY;  Service: Cardiovascular;  Laterality: N/A;  . Cardioversion  01/06/2012    Procedure: CARDIOVERSION;  Surgeon: Wendall Stade, MD;  Location: [facility] ENDOSCOPY;  Service: Cardiovascular;  Laterality: N/A;  call anesthesia  maybe able to do cardio. early at 12:00  .  Cardioversion  02/13/2012    Procedure: CARDIOVERSION;  Surgeon: Cassell Clement, MD;  Location: [facility] ENDOSCOPY;  Service: Cardiovascular;  Laterality: N/A;  . Tonsillectomy    . Cholecystectomy  1999  . Left inguinal hernia repair  02/2007  . Hernia repair    . Coronary angioplasty with stent placement  04/2000    DES to LAD and ramus intermediate vessel   Family History  Problem Relation Age of Onset  . Heart disease Father    Social History  Substance Use Topics  . Smoking status: Former Smoker    Types: Cigarettes    Quit date: 02/11/1974  .  Smokeless tobacco: None  . Alcohol Use: No    Review of Systems  Respiratory: Positive for shortness of breath.   Psychiatric/Behavioral: The patient has insomnia.   All other systems reviewed and are negative.     Allergies  Amlodipine; Ace inhibitors; and Clonidine derivatives  Home Medications   Prior to Admission medications   Medication Sig Start Date End Date Taking? Authorizing Provider  amiodarone (PACERONE) 200 MG tablet Take 1.5 tablets (300 mg total) by mouth daily. 09/15/14   Lennette Bihari, MD  aspirin 81 MG chewable tablet Chew 1 tablet (81 mg total) by mouth daily. 03/03/12   Simonne Martinet, NP  carvedilol (COREG) 25 MG tablet Take 25 mg by mouth 2 (two) times daily with a meal.    Historical Provider, MD  digoxin (LANOXIN) 0.125 MG tablet Take 0.5 tablets (0.0625 mg total) by mouth daily. 09/15/14   Lennette Bihari, MD  doxazosin (CARDURA) 1 MG tablet Take 1 tablet (1 mg total) by mouth at bedtime. 01/04/14   Lennette Bihari, MD  furosemide (LASIX) 40 MG tablet  08/18/14   Historical Provider, MD  hydrALAZINE (APRESOLINE) 50 MG tablet Take 100 mg by mouth 3 (three) times daily.  07/31/14   Historical Provider, MD  isosorbide mononitrate (IMDUR) 60 MG 24 hr tablet Take 60 mg by mouth 2 (two) times daily.    Historical Provider, MD  ketorolac (ACULAR) 0.5 % ophthalmic solution Place 1 drop into the left eye 4 (four) times daily. 08/10/14   Historical Provider, MD  levothyroxine (SYNTHROID, LEVOTHROID) 50 MCG tablet Take 1 tablet (50 mcg total) by mouth daily before breakfast. 05/05/14   Abelino Derrick, PA-C  nitroGLYCERIN (NITROSTAT) 0.4 MG SL tablet Place 1 tablet (0.4 mg total) under the tongue every 5 (five) minutes x 3 doses as needed. For chest pain 08/12/12   Lennette Bihari, MD  potassium chloride SA (K-DUR,KLOR-CON) 20 MEQ tablet Take 1 tablet (20 mEq total) by mouth daily. 04/12/14   Lennette Bihari, MD  rosuvastatin (CRESTOR) 20 MG tablet Take 1 tablet (20 mg total) by mouth  daily. 02/02/14   Lennette Bihari, MD  warfarin (COUMADIN) 1 MG tablet Please do not take Coumadin until you see PCP as your INR is 3.52, You must see your PCP in 1 day to have PT/INR checked 08/17/14   Dorothea Ogle, MD  warfarin (COUMADIN) 5 MG tablet TAKE 1 TABLET DAILY OR AS DIRECTED. 09/13/14   Lennette Bihari, MD   BP 157/99 mmHg  Pulse 104  Temp(Src) 97.4 F (36.3 C) (Oral)  Resp 20  Ht 5\' 10"  (1.778 m)  Wt 150 lb (68.04 kg)  BMI 21.52 kg/m2  SpO2 96% Physical Exam  Constitutional: He appears well-developed and well-nourished. No distress.  HENT:  Head: Normocephalic and atraumatic.  Mouth/Throat: Oropharynx is  clear and moist. No oropharyngeal exudate.  Eyes: Conjunctivae and EOM are normal. Pupils are equal, round, and reactive to light. Right eye exhibits no discharge. Left eye exhibits no discharge. No scleral icterus.  Neck: Normal range of motion. Neck supple. No JVD present. No thyromegaly present.  Cardiovascular: Normal rate, regular rhythm, normal heart sounds and intact distal pulses.  Exam reveals no gallop and no friction rub.   No murmur heard. Pulmonary/Chest: Effort normal. No respiratory distress. He has no wheezes. He has rales ( Subtle intermittent rales at the bases).  No distress, speaks in full sentences  Abdominal: Soft. Bowel sounds are normal. He exhibits no distension and no mass. There is no tenderness.  Musculoskeletal: Normal range of motion. He exhibits no edema or tenderness.  Lymphadenopathy:    He has no cervical adenopathy.  Neurological: He is alert. Coordination normal.  Skin: Skin is warm and dry. No rash noted. No erythema.  Psychiatric: He has a normal mood and affect. His behavior is normal.  Nursing note and vitals reviewed.   ED Course  Procedures (including critical care time) Labs Review Labs Reviewed  BASIC METABOLIC PANEL - Abnormal; Notable for the following:    Chloride 97 (*)    Glucose, Bld 120 (*)    BUN 36 (*)     Creatinine, Ser 2.03 (*)    Calcium 8.7 (*)    GFR calc non Af Amer 32 (*)    GFR calc Af Amer 37 (*)    All other components within normal limits  CBC - Abnormal; Notable for the following:    RBC 4.01 (*)    Hemoglobin 10.3 (*)    HCT 33.4 (*)    MCH 25.7 (*)    RDW 16.4 (*)    All other components within normal limits  DIGOXIN LEVEL - Abnormal; Notable for the following:    Digoxin Level <0.2 (*)    All other components within normal limits  PROTIME-INR - Abnormal; Notable for the following:    Prothrombin Time 28.1 (*)    INR 2.68 (*)    All other components within normal limits  BRAIN NATRIURETIC PEPTIDE - Abnormal; Notable for the following:    B Natriuretic Peptide 2688.1 (*)    All other components within normal limits  TROPONIN I - Abnormal; Notable for the following:    Troponin I 0.17 (*)    All other components within normal limits  I-STAT TROPOININ, ED - Abnormal; Notable for the following:    Troponin i, poc 0.18 (*)    All other components within normal limits    Imaging Review Dg Chest 2 View  10/29/2014   CLINICAL DATA:  Shortness of breath, cough.  EXAM: CHEST  2 VIEW  COMPARISON:  08/17/2014  FINDINGS: Cardiomediastinal silhouette is enlarged. Mediastinal contours appear intact. Thoracic aorta is torturous and contains atherosclerotic calcifications. Coronary artery stents are seen.  There is no evidence of pneumothorax. There are small bilateral pleural effusions. There is mild coarsening of the interstitial markings. There is a 15 mm opacity seen overlying the periphery of the right lower lung on the frontal view.  Osseous structures are without acute abnormality. Soft tissues are grossly normal.  IMPRESSION: Cardiomegaly.  Small bilateral pleural effusions, and mild pulmonary vascular congestion.  15 mm soft tissue density overlying the right lower lung. This may represent an area of round atelectasis, however follow-up with PA and lateral radiograph, or chest  CT, is recommended to assure resolution, as  subpleural soft tissue mass cannot be excluded.   Electronically Signed   By: Ted Mcalpine M.D.   On: 10/29/2014 20:12   I have personally reviewed and evaluated these images and lab results as part of my medical decision-making.   EKG Interpretation   Date/Time:  Saturday October 29 2014 18:12:56 EDT Ventricular Rate:  97 PR Interval:    QRS Duration: 144 QT Interval:  354 QTC Calculation: 449 R Axis:   -77 Text Interpretation:  Atrial fibrillation with premature ventricular or  aberrantly conducted complexes Left axis deviation Left bundle branch  block Abnormal ECG Since last tracing no significant changes - persistent  LBBB and ST abnormality Confirmed by MILLER  MD, BRIAN ([id number]) on  10/29/2014 6:18:55 PM      MDM   Final diagnoses:  Acute on chronic congestive heart failure, unspecified congestive heart failure type    The patient appears to be in no distress, there is no peripheral edema, no significant JVD, no tachycardia but has some slight irregularity to his rhythm. His EKG is very abnormal but has been in the past and has known left ventricular hypertrophy thus I do not think that the EKG changes are of any significance. He also has a troponin which is elevated but has been chronically elevated in the past as well. We'll obtain chest x-ray and discussed with cardiology. The question of whether his shortness of breath is causing the insomnia is unclear, the patient is a terrible historian. Will need further labs.  The patient has improved after Lasix, discussed with hospitalist who will admit for just of heart failure.  Eber Hong, MD 10/29/14 [phone]

## 2014-10-30 ENCOUNTER — Encounter (HOSPITAL_COMMUNITY): Payer: Self-pay | Admitting: Internal Medicine

## 2014-10-30 ENCOUNTER — Observation Stay (HOSPITAL_COMMUNITY): Payer: Medicare Other

## 2014-10-30 DIAGNOSIS — I480 Paroxysmal atrial fibrillation: Secondary | ICD-10-CM

## 2014-10-30 DIAGNOSIS — I2510 Atherosclerotic heart disease of native coronary artery without angina pectoris: Secondary | ICD-10-CM | POA: Diagnosis present

## 2014-10-30 DIAGNOSIS — I701 Atherosclerosis of renal artery: Secondary | ICD-10-CM | POA: Diagnosis not present

## 2014-10-30 DIAGNOSIS — I10 Essential (primary) hypertension: Secondary | ICD-10-CM | POA: Diagnosis not present

## 2014-10-30 DIAGNOSIS — I4891 Unspecified atrial fibrillation: Secondary | ICD-10-CM | POA: Diagnosis not present

## 2014-10-30 DIAGNOSIS — G4700 Insomnia, unspecified: Secondary | ICD-10-CM | POA: Diagnosis present

## 2014-10-30 DIAGNOSIS — N184 Chronic kidney disease, stage 4 (severe): Secondary | ICD-10-CM

## 2014-10-30 DIAGNOSIS — Z8673 Personal history of transient ischemic attack (TIA), and cerebral infarction without residual deficits: Secondary | ICD-10-CM | POA: Diagnosis not present

## 2014-10-30 DIAGNOSIS — Z955 Presence of coronary angioplasty implant and graft: Secondary | ICD-10-CM | POA: Diagnosis not present

## 2014-10-30 DIAGNOSIS — E039 Hypothyroidism, unspecified: Secondary | ICD-10-CM | POA: Diagnosis present

## 2014-10-30 DIAGNOSIS — Z8249 Family history of ischemic heart disease and other diseases of the circulatory system: Secondary | ICD-10-CM | POA: Diagnosis not present

## 2014-10-30 DIAGNOSIS — T502X5A Adverse effect of carbonic-anhydrase inhibitors, benzothiadiazides and other diuretics, initial encounter: Secondary | ICD-10-CM | POA: Diagnosis present

## 2014-10-30 DIAGNOSIS — I509 Heart failure, unspecified: Secondary | ICD-10-CM | POA: Diagnosis not present

## 2014-10-30 DIAGNOSIS — N179 Acute kidney failure, unspecified: Secondary | ICD-10-CM | POA: Diagnosis present

## 2014-10-30 DIAGNOSIS — R7989 Other specified abnormal findings of blood chemistry: Secondary | ICD-10-CM

## 2014-10-30 DIAGNOSIS — J9601 Acute respiratory failure with hypoxia: Secondary | ICD-10-CM | POA: Diagnosis present

## 2014-10-30 DIAGNOSIS — I5043 Acute on chronic combined systolic (congestive) and diastolic (congestive) heart failure: Principal | ICD-10-CM

## 2014-10-30 DIAGNOSIS — Z79899 Other long term (current) drug therapy: Secondary | ICD-10-CM | POA: Diagnosis not present

## 2014-10-30 DIAGNOSIS — E785 Hyperlipidemia, unspecified: Secondary | ICD-10-CM | POA: Diagnosis present

## 2014-10-30 DIAGNOSIS — Z7901 Long term (current) use of anticoagulants: Secondary | ICD-10-CM | POA: Diagnosis not present

## 2014-10-30 DIAGNOSIS — I129 Hypertensive chronic kidney disease with stage 1 through stage 4 chronic kidney disease, or unspecified chronic kidney disease: Secondary | ICD-10-CM | POA: Diagnosis present

## 2014-10-30 DIAGNOSIS — I255 Ischemic cardiomyopathy: Secondary | ICD-10-CM | POA: Diagnosis not present

## 2014-10-30 DIAGNOSIS — E780 Pure hypercholesterolemia: Secondary | ICD-10-CM | POA: Diagnosis present

## 2014-10-30 DIAGNOSIS — R0602 Shortness of breath: Secondary | ICD-10-CM | POA: Diagnosis present

## 2014-10-30 DIAGNOSIS — Z87891 Personal history of nicotine dependence: Secondary | ICD-10-CM | POA: Diagnosis not present

## 2014-10-30 DIAGNOSIS — Z7982 Long term (current) use of aspirin: Secondary | ICD-10-CM | POA: Diagnosis not present

## 2014-10-30 LAB — PROTIME-INR
INR: 2.51 — ABNORMAL HIGH (ref 0.00–1.49)
Prothrombin Time: 26.8 seconds — ABNORMAL HIGH (ref 11.6–15.2)

## 2014-10-30 LAB — TROPONIN I
TROPONIN I: 0.16 ng/mL — AB (ref <0.031)
TROPONIN I: 0.2 ng/mL — AB (ref <0.031)
TROPONIN I: 0.2 ng/mL — AB (ref <0.031)

## 2014-10-30 LAB — TSH: TSH: 0.015 u[IU]/mL — ABNORMAL LOW (ref 0.350–4.500)

## 2014-10-30 MED ORDER — SODIUM CHLORIDE 0.9 % IV SOLN: 250.0000 mL | INTRAVENOUS | Status: DC | PRN

## 2014-10-30 MED ORDER — CARVEDILOL 25 MG PO TABS
25.0000 mg | ORAL_TABLET | Freq: Two times a day (BID) | ORAL | Status: DC
  Administered 2014-10-30 – 2014-10-31 (×3): 25 mg via ORAL
  Filled 2014-10-30 (×3): qty 1

## 2014-10-30 MED ORDER — LEVOTHYROXINE SODIUM 50 MCG PO TABS
50.0000 ug | ORAL_TABLET | Freq: Every day | ORAL | Status: DC
  Administered 2014-10-30: 50 ug via ORAL
  Filled 2014-10-30: qty 1

## 2014-10-30 MED ORDER — ONDANSETRON HCL 4 MG/2ML IJ SOLN: 4.0000 mg | Freq: Three times a day (TID) | INTRAMUSCULAR | Status: DC | PRN

## 2014-10-30 MED ORDER — HYDRALAZINE HCL 50 MG PO TABS
100.0000 mg | ORAL_TABLET | Freq: Three times a day (TID) | ORAL | Status: DC
  Administered 2014-10-30 – 2014-10-31 (×5): 100 mg via ORAL
  Filled 2014-10-30 (×7): qty 2

## 2014-10-30 MED ORDER — ISOSORBIDE MONONITRATE ER 60 MG PO TB24
60.0000 mg | ORAL_TABLET | Freq: Two times a day (BID) | ORAL | Status: DC
  Administered 2014-10-30 – 2014-10-31 (×3): 60 mg via ORAL
  Filled 2014-10-30 (×3): qty 1

## 2014-10-30 MED ORDER — SODIUM CHLORIDE 0.9 % IJ SOLN: 3.0000 mL | INTRAMUSCULAR | Status: DC | PRN

## 2014-10-30 MED ORDER — ASPIRIN 81 MG PO CHEW
81.0000 mg | CHEWABLE_TABLET | Freq: Every day | ORAL | Status: DC
Start: 2014-10-30 — End: 2014-10-31
  Administered 2014-10-30: 81 mg via ORAL
  Filled 2014-10-30: qty 1

## 2014-10-30 MED ORDER — DOXAZOSIN MESYLATE 1 MG PO TABS
1.0000 mg | ORAL_TABLET | Freq: Every day | ORAL | Status: DC
  Administered 2014-10-30 (×2): 1 mg via ORAL
  Filled 2014-10-30 (×3): qty 1

## 2014-10-30 MED ORDER — ACETAMINOPHEN 325 MG PO TABS: 650.0000 mg | ORAL_TABLET | ORAL | Status: DC | PRN

## 2014-10-30 MED ORDER — DIGOXIN 125 MCG PO TABS
0.0625 mg | ORAL_TABLET | Freq: Every day | ORAL | Status: DC
  Administered 2014-10-30: 0.0625 mg via ORAL
  Administered 2014-10-31: 0.625 mg via ORAL
  Filled 2014-10-30 (×2): qty 1

## 2014-10-30 MED ORDER — FUROSEMIDE 10 MG/ML IJ SOLN
80.0000 mg | Freq: Two times a day (BID) | INTRAMUSCULAR | Status: DC
  Administered 2014-10-30 (×2): 80 mg via INTRAVENOUS
  Filled 2014-10-30 (×3): qty 8

## 2014-10-30 MED ORDER — POTASSIUM CHLORIDE CRYS ER 20 MEQ PO TBCR
20.0000 meq | EXTENDED_RELEASE_TABLET | Freq: Every day | ORAL | Status: DC
  Administered 2014-10-30: 20 meq via ORAL
  Filled 2014-10-30: qty 1

## 2014-10-30 MED ORDER — WARFARIN SODIUM 2.5 MG PO TABS
2.5000 mg | ORAL_TABLET | Freq: Once | ORAL | Status: AC
  Administered 2014-10-30: 2.5 mg via ORAL
  Filled 2014-10-30: qty 1

## 2014-10-30 MED ORDER — AMIODARONE HCL 200 MG PO TABS
300.0000 mg | ORAL_TABLET | Freq: Every day | ORAL | Status: DC
  Administered 2014-10-30: 300 mg via ORAL
  Filled 2014-10-30 (×2): qty 1

## 2014-10-30 MED ORDER — WARFARIN - PHARMACIST DOSING INPATIENT
Freq: Every day | Status: DC
  Administered 2014-10-30: 18:00:00

## 2014-10-30 MED ORDER — KETOROLAC TROMETHAMINE 0.5 % OP SOLN
1.0000 [drp] | Freq: Three times a day (TID) | OPHTHALMIC | Status: DC
  Filled 2014-10-30: qty 3

## 2014-10-30 MED ORDER — SODIUM CHLORIDE 0.9 % IJ SOLN
3.0000 mL | Freq: Two times a day (BID) | INTRAMUSCULAR | Status: DC
  Administered 2014-10-30 (×3): 3 mL via INTRAVENOUS

## 2014-10-30 MED ORDER — HYDRALAZINE HCL 20 MG/ML IJ SOLN: 10.0000 mg | INTRAMUSCULAR | Status: DC | PRN

## 2014-10-30 MED ORDER — LEVOTHYROXINE SODIUM 25 MCG PO TABS
25.0000 ug | ORAL_TABLET | Freq: Every day | ORAL | Status: DC
  Administered 2014-10-31: 25 ug via ORAL
  Filled 2014-10-30: qty 1

## 2014-10-30 MED ORDER — ROSUVASTATIN CALCIUM 20 MG PO TABS
20.0000 mg | ORAL_TABLET | Freq: Every day | ORAL | Status: DC
Start: 2014-10-30 — End: 2014-10-31
  Administered 2014-10-30 – 2014-10-31 (×2): 20 mg via ORAL
  Filled 2014-10-30 (×2): qty 1

## 2014-10-30 MED ORDER — ONDANSETRON HCL 4 MG/2ML IJ SOLN: 4.0000 mg | Freq: Four times a day (QID) | INTRAMUSCULAR | Status: DC | PRN

## 2014-10-30 MED ORDER — NITROGLYCERIN 0.4 MG SL SUBL: 0.4000 mg | SUBLINGUAL_TABLET | SUBLINGUAL | Status: DC | PRN

## 2014-10-30 NOTE — ED Notes (Signed)
Attempted report 

## 2014-10-30 NOTE — Progress Notes (Addendum)
ANTICOAGULATION CONSULT NOTE - Initial Consult  Pharmacy Consult for Coumadin Indication: atrial fibrillation  Allergies  Allergen Reactions  . Amlodipine Shortness Of Breath, Swelling and Palpitations    "Caused  Heart condition-per patient"  . Ace Inhibitors Other (See Comments)    Unknown reaction  . Clonidine Derivatives Other (See Comments)    Unknown reaction    Patient Measurements: Height: 5\' 10"  (177.8 cm) Weight: 150 lb (68.04 kg) IBW/kg (Calculated) : 73  Vital Signs: Temp: 97.4 F (36.3 C) (09/17 1814) Temp Source: Oral (09/17 1814) BP: 164/104 mmHg (09/18 0015) Pulse Rate: 97 (09/18 0015)  Labs:  Recent Labs  10/28/14 1548 10/29/14 1823 10/29/14 2054  HGB  --  10.3*  --   HCT  --  33.4*  --   PLT  --  211  --   LABPROT  --  28.1*  --   INR 2.7 2.68*  --   CREATININE  --  2.03*  --   TROPONINI  --   --  0.17*    Estimated Creatinine Clearance: 33 mL/min (by C-G formula based on Cr of 2.03).   Medical History: Past Medical History  Diagnosis Date  . CAD (coronary artery disease)     myoview 03/02/12-EF 32%, mod fixed defect in the inf wall without significant ischemia; a. s/p BMS to mLAD 2000;  b. LHC 3/12:  sig ISR mLAD, RI 80-90%, Dx1 90% (too small for PCI);  PCI:  ION DES to mLAD, ION DES to RI  . HTN (hypertension)   . Hypercholesteremia   . CVA (cerebral infarction)   . CRI (chronic renal insufficiency)     Baseline creatinine of 1.4 to 1.7  . 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 Atrial fibrillation 11/13/2011    coumadin and amiodarone; s/p DCCV November 2013; repeat DCCV Feb 13, 2012  . Left atrial thrombus     TEE 11/2011  . Iron deficiency anemia   . Cardiomyopathy, ischemic     echo 02/28/12-Ef 25%, mod mr, mild-mod TR, mod pul htn; refuses ICD  . CHF (congestive heart failure)     Medications:  Amiodarone  ASA  Digoxin  Cardura  Sunthroid  Ntg  Crestor Coreg  Lasix  Hydralazine  Imdur  KCl    Coumadin--pt  unsure of regimen   Assessment: 69 y.o. male admitted with CHF, h/o Afib, to continue Coumadin  Goal of Therapy:  INR 2-3 Monitor platelets by anticoagulation protocol: Yes   Plan:  Daily INR F/U home Coumadin regimen  Eddie Candle 10/30/2014,1:09 AM  Addendum:  Home warfarin regimen is 5mg  on Monday, Wednesday and Friday - 2.5mg  other days.  INR 2.51 today. Will resume home dose of 2.5mg  x 1 dose today.  Celedonio Miyamoto, PharmD, BCPS-AQ ID Clinical Pharmacist Pager [phone]

## 2014-10-30 NOTE — Plan of Care (Signed)
Problem: Phase I Progression Outcomes Goal: EF % per last Echo/documented,Core Reminder form on chart Outcome: Completed/Met Date Met:  10/30/14 EF 15%(10-30-14)

## 2014-10-30 NOTE — ED Notes (Signed)
Dr. Nicanor Alcon, Dr. Hyacinth Meeker and Ebbie Ridge PA all made aware of patient's blood pressure trends and high diastolic. All three providers verbalize that pt is okay to go upstairs and have pressure managed on inpatient level.

## 2014-10-30 NOTE — Discharge Summary (Signed)
Physician Discharge Summary  Erik Adams SXJ:[id number] DOB: 01-30-1946 DOA: 10/29/2014  PCP: No PCP Per Patient  Admit date: 10/29/2014 Discharge date:10/31/14 Recommendations for Outpatient Follow-up:  1. Pt will need to follow up with PCP in 1 week post discharge 2. Please obtain BMP in one week 3. Please recheck TSH in one month  Discharge Diagnoses:  Acute respiratory failure with hypoxia  -Improved with diuresis  -Presently stable on room air   Acute on chronic systolic and diastolic CHF -Multifactorial including poorly controlled HTNsome dietary indiscretion as well as concerns about the patient's compliance regarding his medications -He seems to have some confusion, poor insight regarding his numerous cardiac medications upon further questioning -Continue furosemide 80 mg IV twice a day -Clinically improving with IV furosemide -Daily weights  -NEG 8 pounds since admission  -Question accuracy of I/Os--negative 1.2L since admit -Continue carvedilol, hydralazine, Imdur  -cannot use ACE due to renal failure -Echocardiogram showed EF 15% with diffuse HK and dilated aortic root at 39 mm -cardiology was consulted--felt pt could go home with close followup--decreased amiodarone to 200mg  daily and home with lasix 80 mg daily Paroxysmal Atrial fibrillation with history of left atrial thrombus  -Rate Controlled  -Continue carvedilol  -Continue warfarin  -INR is therapeutic  -continue amiodarone -TSH is 0.015 which may be contributing to his atrial fibrillation   Hypothyroidism -TSH is suppressed -Decreased dose of levothyroxine -Recheck TSH in one month  Hypertension -I suspect the patient has been missing some of his medications -Please note that the patient's blood pressure is well controlled since admission and was poorly controlled at the time of admission -also partly contributing to the patient's decompensated CHF -Remarkably, the patient's blood pressure  is under better control during the hospitalization  Ischemic cardiomyopathy/CAD  -08/26/2014 echocardiogram EF 35-40 percent, grade 1 DD, diffuse HK -Continue anticoagulation  -Continue aspirin  -status post bare metal stent to the LAD in 2000, multiple drug-eluting stents placed in 2012  Elevated troponin  -This has been chronic  -secondary to the patient's CKD as well as decompensated CHF  -EKG without concerning ischemic changes   Abnormal right lower lobe density  -Repeat chest x-ray, PA and lateral   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 Per the cardiology notes, he has been resistant to having a defibrillator placed and he has had some problems with compliance with his medications. By the time of admission, the patient was noted to be hypoxic with oxygen saturation 88% in the emergency department. Chest x-ray showed bilateral pleural effusion with pulmonary  vascular  congestion. He also admits to eating bacon several times a week and occasional canned goods and pizza.  The patient was started on intravenous furosemide with good clinical effect. The patient improved symptomatically. He was weaned off of oxygen. He remained stable on room air.  Discharge Exam: Filed Vitals:   10/31/14 0944  BP: 142/83  Pulse: 77  Temp: 98.4 F (36.9 C)  Resp: 18   Filed Vitals:   10/31/14 0445 10/31/14 0500 10/31/14 0647 10/31/14 0944  BP: 133/81  137/89 142/83  Pulse: 73  88 77  Temp: 97.8 F (36.6 C)   98.4 F (36.9 C)  TempSrc: Oral   Oral  Resp: 18   18  Height:      Weight:  64.638 kg (142 lb 8 oz)    SpO2: 93%   99%   General: A&O x 3, NAD, pleasant, cooperative Cardiovascular: RRR, no rub, no gallop, no S3 Respiratory: CTAB, no wheeze, no rhonchi Abdomen:soft, nontender, nondistended, positive bowel sounds Extremities: No edema, No lymphangitis, no petechiae  Discharge Instructions      Discharge Instructions    Diet - low sodium heart healthy    Complete by:  As directed      Increase activity slowly    Complete by:  As directed             Medication List    TAKE these medications        amiodarone 200 MG tablet  Commonly known as:  PACERONE  Take 1 tablet (200 mg total) by mouth daily.     aspirin 81 MG chewable tablet  Chew 1 tablet (81 mg total) by mouth daily.     carvedilol 25 MG tablet  Commonly known as:  COREG  Take 25 mg by mouth 2 (two) times daily with a meal.     digoxin 0.125 MG tablet  Commonly known as:  LANOXIN  Take 0.5 tablets (0.0625 mg total) by mouth daily.     doxazosin 1 MG tablet  Commonly known as:  CARDURA  Take 1 tablet (1 mg total) by mouth at bedtime.     furosemide 80 MG tablet  Commonly known as:  LASIX  Take 1 tablet (80 mg total) by mouth daily.     hydrALAZINE 50 MG tablet  Commonly known as:  APRESOLINE  Take 100 mg by mouth 3 (three) times daily.     isosorbide mononitrate 60 MG  24 hr tablet  Commonly known as:  IMDUR  Take 60 mg by mouth 2 (two) times daily.     ketorolac 0.5 % ophthalmic solution  Commonly known as:  ACULAR  Place 1 drop into the left eye 4 (four) times daily.     levothyroxine 25 MCG tablet  Commonly known as:  SYNTHROID, LEVOTHROID  Take 1 tablet (25 mcg total) by mouth daily before breakfast.     nitroGLYCERIN 0.4 MG SL tablet  Commonly known as:  NITROSTAT  Place 1 tablet (0.4 mg total) under the tongue every 5 (five) minutes x 3 doses as needed. For chest pain     potassium chloride SA 20 MEQ tablet  Commonly known as:  K-DUR,KLOR-CON  Take 1 tablet (20 mEq total) by mouth daily.     rosuvastatin 20 MG tablet  Commonly known as:  CRESTOR  Take 1 tablet (20 mg total) by mouth daily.     warfarin 5 MG tablet  Commonly known as:  COUMADIN  TAKE 1 TABLET  DAILY OR AS DIRECTED.         The results of significant diagnostics from this hospitalization (including imaging, microbiology, ancillary and laboratory) are listed below for reference.    Significant Diagnostic Studies: Dg Chest 2 View  10/31/2014   CLINICAL DATA:  69 year old male with congestive heart failure  EXAM: CHEST  2 VIEW  COMPARISON:  Prior chest x-ray 10/29/2014  FINDINGS: Stable cardiomegaly. Metallic stents project over the left heart. Atherosclerotic calcifications again noted in the transverse aorta. The lungs are hyperinflated with central bronchitic change and mild upper lung predominant emphysema. Small bilateral pleural effusions have largely resolved. Focus of probable pleural parenchymal scarring in the periphery of the right base appears less nodular on today's examination. No pulmonary edema. No acute osseous abnormality.  IMPRESSION: 1. Resolved bilateral pleural effusions. 2. The nodular density in the periphery of the right lung base appears more linear and scar-like on today's evaluation. Recommend 1 additional follow-up chest x-ray in 4 weeks to exclude a  true underlying pulmonary nodule. 3. Stable cardiomegaly without evidence of pulmonary edema.   Electronically Signed   By: Malachy Moan M.D.   On: 10/31/2014 07:46   Dg Chest 2 View  10/29/2014   CLINICAL DATA:  Shortness of breath, cough.  EXAM: CHEST  2 VIEW  COMPARISON:  08/17/2014  FINDINGS: Cardiomediastinal silhouette is enlarged. Mediastinal contours appear intact. Thoracic aorta is torturous and contains atherosclerotic calcifications. Coronary artery stents are seen.  There is no evidence of pneumothorax. There are small bilateral pleural effusions. There is mild coarsening of the interstitial markings. There is a 15 mm opacity seen overlying the periphery of the right lower lung on the frontal view.  Osseous structures are without acute abnormality. Soft tissues are grossly normal.  IMPRESSION: Cardiomegaly.  Small bilateral pleural effusions, and mild pulmonary vascular congestion.  15 mm soft tissue density overlying the right lower lung. This may represent an area of round atelectasis, however follow-up with PA and lateral radiograph, or chest CT, is recommended to assure resolution, as subpleural soft tissue mass cannot be excluded.   Electronically Signed   By: Ted Mcalpine M.D.   On: 10/29/2014 20:12     Microbiology: No results found for this or any previous visit (from the past 240 hour(s)).   Labs: Basic Metabolic Panel:  Recent Labs Lab 10/29/14 1823 10/31/14 0354  NA 135 135  K 3.7 2.8*  CL 97* 95*  CO2 27 29  GLUCOSE 120* 97  BUN 36* 36*  CREATININE 2.03* 2.21*  CALCIUM 8.7* 8.3*   Liver Function Tests: No results for input(s): AST, ALT, ALKPHOS, BILITOT, PROT, ALBUMIN in the last 168 hours. No results for input(s): LIPASE, AMYLASE in the last 168 hours. No results for input(s): AMMONIA in the last 168 hours. CBC:  Recent Labs Lab 10/29/14 1823  WBC 8.1  HGB 10.3*  HCT 33.4*  MCV 83.3  PLT 211   Cardiac Enzymes:  Recent Labs Lab  10/29/14 2054 10/30/14 0332 10/30/14 1015 10/30/14 1435  TROPONINI 0.17* 0.16* 0.20* 0.20*   BNP: Invalid input(s): POCBNP CBG: No results for input(s): GLUCAP in the last 168 hours.  Time coordinating discharge:  Greater than 30 minutes  Signed:  TAT, DAVID, DO [facility] Pager: [phone] 10/31/2014, 10:29 AM

## 2014-10-30 NOTE — Progress Notes (Signed)
PROGRESS NOTE  Erik Adams NOT:[id number] DOB: [birth year] DOA: 10/29/2014 PCP: No PCP Per Patient  Brief History 69 y.o. 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 Per the cardiology notes, he has been resistant to having a defibrillator placed and he has had some problems with compliance with his medications. By the time of admission, the patient was noted to be hypoxic with oxygen saturation 88% in the emergency department. Chest x-ray showed bilateral pleural effusion with pulmonary vascular congestion. He also admits to eating bacon several times a week and occasional canned goods and pizza. Assessment/Plan: Acute respiratory failure with hypoxia  -Improved with diuresis  -Presently stable on room air   Acute on chronic systolic and diastolic CHF -Multifactorial including poorly controlled HTNsome dietary indiscretion as well as concerns about the patient's compliance regarding his medications -He seems to have some confusion, poor insight regarding his numerous cardiac medications upon further questioning -Continue furosemide 80 mg IV twice a day -Clinically improving with IV furosemide -Daily weights  -NEG 5 pounds since admission  -Question accuracy of I/Os -Continue carvedilol, hydralazine, Imdur  -cannot use ACE due to renal failure  Paroxysmal Atrial fibrillation with history of left atrial thrombus  -Rate Controlled  -Continue carvedilol  -Continue warfarin  -INR is therapeutic    -continue amiodarone -TSH is 0.015 which may be contributing to his atrial fibrillation   Hypothyroidism -TSH is suppressed -Decreased dose of levothyroxine -Recheck TSH in one month  Hypertension -I suspect the patient has been missing some of his medications -Please note that the patient's blood pressure is well controlled since admission and was poorly controlled at the time of admission -also partly contributing to the patient's decompensated CHF  Ischemic cardiomyopathy/CAD  -08/26/2014 echocardiogram EF 35-40 percent, grade 1 DD, diffuse HK -Continue anticoagulation  -Continue aspirin  -status post bare metal stent to the LAD in 2000, multiple drug-eluting stents placed in 2012  Elevated troponin  -This has been chronic  -secondary to the patient's CKD as well as decompensated CHF  -EKG without concerning ischemic changes   Abnormal right lower lobe density  -Repeat chest x-ray, PA and lateral   CKD stage 3-4 -baseline creatinine 1.5-1.7 -monitor creatinine with diuresis  History of stroke, memory deficits and abnormal affect - Continue aspirin - Patient has follow-up at [facility]    Family Communication:   Pt at beside Disposition Plan:   Home when medically stable     Procedures/Studies: Dg Chest 2 View  10/29/2014   CLINICAL DATA:  Shortness of breath, cough.  EXAM: CHEST  2 VIEW  COMPARISON:  08/17/2014  FINDINGS: Cardiomediastinal silhouette is enlarged. Mediastinal contours appear intact. Thoracic aorta is torturous and contains atherosclerotic calcifications. Coronary artery stents are seen.  There is no evidence of pneumothorax. There are small bilateral pleural effusions. There is mild coarsening of the interstitial markings. There is a 15 mm opacity seen overlying the periphery of the right lower lung on the frontal view.  Osseous structures are without acute abnormality. Soft tissues are  grossly normal.  IMPRESSION: Cardiomegaly.   Small bilateral pleural effusions, and mild pulmonary vascular congestion.  15 mm soft tissue density overlying the right lower lung. This may represent an area of round atelectasis, however follow-up with PA and lateral radiograph, or chest CT, is recommended to assure resolution, as subpleural soft tissue mass cannot be excluded.   Electronically Signed   By: Ted Mcalpine M.D.   On: 10/29/2014 20:12         Subjective: Patient denies fevers, chills, headache, chest pain, dyspnea, nausea, vomiting, diarrhea, abdominal pain, dysuria, hematuria   Objective: Filed Vitals:   10/30/14 0700 10/30/14 0746 10/30/14 1045 10/30/14 1047  BP: 145/98 126/66 133/72   Pulse: 114 81 87 87  Temp:  98.2 F (36.8 C) 97.5 F (36.4 C)   TempSrc:  Oral    Resp:  20    Height:      Weight:      SpO2:  94% 97%     Intake/Output Summary (Last 24 hours) at 10/30/14 1128 Last data filed at 10/30/14 1049  Gross per 24 hour  Intake    388 ml  Output    925 ml  Net   -537 ml   Weight change:  Exam:   General:  Pt is alert, follows commands appropriately, not in acute distress  HEENT: No icterus, No thrush, No neck mass, [place]/AT  Cardiovascular: RRR, S1/S2, no rubs, no gallops  Respiratory: Fine bibasilar crackles. No wheezing. Good air movement  Abdomen: Soft/+BS, non tender, non distended, no guarding  Extremities: No edema, No lymphangitis, No petechiae, No rashes, no synovitis  Data Reviewed: Basic Metabolic Panel:  Recent Labs Lab 10/29/14 1823  NA 135  K 3.7  CL 97*  CO2 27  GLUCOSE 120*  BUN 36*  CREATININE 2.03*  CALCIUM 8.7*   Liver Function Tests: No results for input(s): AST, ALT, ALKPHOS, BILITOT, PROT, ALBUMIN in the last 168 hours. No results for input(s): LIPASE, AMYLASE in the last 168 hours. No results for input(s): AMMONIA in the last 168 hours. CBC:  Recent Labs Lab 10/29/14 1823  WBC 8.1  HGB 10.3*  HCT 33.4*  MCV 83.3  PLT 211   Cardiac  Enzymes:  Recent Labs Lab 10/29/14 2054 10/30/14 0332  TROPONINI 0.17* 0.16*   BNP: Invalid input(s): POCBNP CBG: No results for input(s): GLUCAP in the last 168 hours.  No results found for this or any previous visit (from the past 240 hour(s)).   Scheduled Meds: . amiodarone  300 mg Oral Daily  . aspirin  81 mg Oral Daily  . carvedilol  25 mg Oral BID WC  . digoxin  0.0625 mg Oral Daily  . doxazosin  1 mg Oral QHS  . furosemide  80 mg Intravenous BID  . hydrALAZINE  100 mg Oral 3 times per day  . isosorbide mononitrate  60 mg Oral BID WC  . ketorolac  1 drop Left Eye TID AC & HS  . [START ON 10/31/2014] levothyroxine  25 mcg Oral QAC breakfast  . potassium chloride SA  20 mEq Oral Daily  . rosuvastatin  20 mg Oral Daily  . sodium chloride  3 mL Intravenous Q12H  . warfarin  2.5 mg Oral ONCE-1800  . Warfarin - Pharmacist Dosing Inpatient   Does not apply q1800   Continuous Infusions:    Nyomi Howser, DO  [facility] Pager [phone]  If 7PM-7AM, please contact night-coverage [email or web address] Password [facility] 10/30/2014, 11:28 AM

## 2014-10-31 ENCOUNTER — Inpatient Hospital Stay (HOSPITAL_COMMUNITY): Payer: Medicare Other

## 2014-10-31 ENCOUNTER — Telehealth: Payer: Self-pay | Admitting: Cardiovascular Disease

## 2014-10-31 DIAGNOSIS — N179 Acute kidney failure, unspecified: Secondary | ICD-10-CM

## 2014-10-31 DIAGNOSIS — N183 Chronic kidney disease, stage 3 unspecified: Secondary | ICD-10-CM

## 2014-10-31 DIAGNOSIS — I255 Ischemic cardiomyopathy: Secondary | ICD-10-CM

## 2014-10-31 LAB — BASIC METABOLIC PANEL
Anion gap: 11 (ref 5–15)
BUN: 36 mg/dL — ABNORMAL HIGH (ref 6–20)
CALCIUM: 8.3 mg/dL — AB (ref 8.9–10.3)
CO2: 29 mmol/L (ref 22–32)
CREATININE: 2.21 mg/dL — AB (ref 0.61–1.24)
Chloride: 95 mmol/L — ABNORMAL LOW (ref 101–111)
GFR calc non Af Amer: 29 mL/min — ABNORMAL LOW (ref >60)
GFR, EST AFRICAN AMERICAN: 33 mL/min — AB (ref >60)
GLUCOSE: 97 mg/dL (ref 65–99)
Potassium: 2.8 mmol/L — ABNORMAL LOW (ref 3.5–5.1)
Sodium: 135 mmol/L (ref 135–145)

## 2014-10-31 LAB — PROTIME-INR
INR: 2.25 — ABNORMAL HIGH (ref 0.00–1.49)
Prothrombin Time: 24.6 seconds — ABNORMAL HIGH (ref 11.6–15.2)

## 2014-10-31 MED ORDER — FUROSEMIDE 80 MG PO TABS: 80.0000 mg | ORAL_TABLET | Freq: Every day | ORAL | Status: AC

## 2014-10-31 MED ORDER — POTASSIUM CHLORIDE CRYS ER 20 MEQ PO TBCR
40.0000 meq | EXTENDED_RELEASE_TABLET | ORAL | Status: AC
  Administered 2014-10-31 (×2): 40 meq via ORAL
  Filled 2014-10-31 (×2): qty 2

## 2014-10-31 MED ORDER — AMIODARONE HCL 200 MG PO TABS: 200.0000 mg | ORAL_TABLET | Freq: Every day | ORAL | Status: AC

## 2014-10-31 MED ORDER — WARFARIN SODIUM 5 MG PO TABS: 5.0000 mg | ORAL_TABLET | Freq: Once | ORAL | Status: DC

## 2014-10-31 MED ORDER — AMIODARONE HCL 200 MG PO TABS
200.0000 mg | ORAL_TABLET | Freq: Every day | ORAL | Status: DC
  Administered 2014-10-31: 200 mg via ORAL
  Filled 2014-10-31: qty 1

## 2014-10-31 MED ORDER — LEVOTHYROXINE SODIUM 25 MCG PO TABS: 25.0000 ug | ORAL_TABLET | Freq: Every day | ORAL | Status: DC

## 2014-10-31 NOTE — Telephone Encounter (Signed)
TOC call to patient.He stated he was feeling better since discharged from hospital.He understands discharge instructions and is taking medications as prescribed.Stated he will not see a PA for post hospital follow up.Stated he wants to see Dr.Kelly only.Scheduler will call back 11/01/14 to schedule post hospital appt with Dr.Kelly.

## 2014-10-31 NOTE — Progress Notes (Signed)
ANTICOAGULATION CONSULT NOTE Pharmacy Consult for Coumadin Indication: atrial fibrillation  Allergies  Allergen Reactions  . Amlodipine Shortness Of Breath, Swelling and Palpitations    "Caused  Heart condition-per patient"  . Ace Inhibitors Other (See Comments)    Unknown reaction  . Clonidine Derivatives Other (See Comments)    Unknown reaction    Patient Measurements: Height: 5\' 10"  (177.8 cm) Weight: 142 lb 8 oz (64.638 kg) IBW/kg (Calculated) : 73  Vital Signs: Temp: 97.5 F (36.4 C) (09/19 1214) Temp Source: Oral (09/19 1214) BP: 130/89 mmHg (09/19 1214) Pulse Rate: 79 (09/19 1214)  Labs:  Recent Labs  10/29/14 1823  10/30/14 0332 10/30/14 0455 10/30/14 1015 10/30/14 1435 10/31/14 0354  HGB 10.3*  --   --   --   --   --   --   HCT 33.4*  --   --   --   --   --   --   PLT 211  --   --   --   --   --   --   LABPROT 28.1*  --   --  26.8*  --   --  24.6*  INR 2.68*  --   --  2.51*  --   --  2.25*  CREATININE 2.03*  --   --   --   --   --  2.21*  TROPONINI  --   < > 0.16*  --  0.20* 0.20*  --   < > = values in this interval not displayed.  Estimated Creatinine Clearance: 28.8 mL/min (by C-G formula based on Cr of 2.21).   Assessment: 69 year old male continues on home regimen of Coumadin - 5 mg MWF, 2.5 mg other days INR therapeutic  Goal of Therapy:  INR 2-3 Monitor platelets by anticoagulation protocol: Yes   Plan:  Daily INR Coumadin 5 mg po x 1 tonight  Thank you Okey Regal, PharmD [phone]  10/31/2014,2:07 PM

## 2014-10-31 NOTE — Progress Notes (Signed)
Discharge instructions given to patient and once daughter arrived, reviewed medication list with daughter. All questions answered and patient and daughter verbalized full understanding. Pt d/c by wheelchair by NT.

## 2014-10-31 NOTE — Care Management Note (Signed)
Case Management Note  Patient Details  Name: Erik Adams MRN: [id number] Date of Birth: 06/11/1945  Subjective/Objective:   Admitted with CHF                 Action/Plan: Patient lives at home with son. He states that his son does all of the cooking and he does not eat out a lot. He continues to drive and is independent of his ADL's. No DME used at this time. Has Medicare as primary insurance. [employer] - he does not have any problem getting his medication. Patient has scales at home but does not weigh himself daily, CM counseled patient on the importance of weighing himself daily and watching the sodium intake in his food.  Expected Discharge Date:  10/31/2014              Expected Discharge Plan:  Home/Self Care  In-House Referral:   [facility]  Discharge planning Services  CM Consult      Status of Service:  In process, will continue to follow  Reola Mosher [phone] 10/31/2014, 10:42 AM

## 2014-10-31 NOTE — Progress Notes (Signed)
Heart Failure Navigator Consult Note  Presentation: Erik Adams is a 69 y.o. 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 Per the cardiology notes, he has been resistant to having a defibrillator placed and he has had some problems with compliance with his medications. The patient states that his base/dry weight is 150 pounds. He states that he has had some changes made to his medications over the last few months which included the addition of some blood pressure medications and adjustments to his blood pressure medications after hospitalization in July. It does not appear that he has had major changes to his diuretics per hospital notes or per cardiology outpatient visit notes. He states that he had been otherwise feeling well without fevers, signs of infection, chest pain, or palpitations when approximately 3 days ago he noticed that he had difficulty sleeping secondary to shortness of breath. He would lie down but then he would wake up short of breath and have to sit in the chair for a while. He states he did not sleep at all and so he increased his Lasix from 40 mg twice a day to 80 mg twice a day. He states that this caused him to void more frequently, but he had aggressive worsening of his orthopnea and PND. He denies lower extremity swelling, abdominal swelling, or obvious weight gain. He states he has been compliant with a low sodium diet however he also admits to eating bacon several times a week and occasional canned  goods. He is also unable to tell me his list of medications.   Past Medical History  Diagnosis Date  . CAD (coronary artery disease)     myoview 03/02/12-EF 32%, mod fixed defect in the inf wall without significant ischemia; a. s/p BMS to mLAD 2000;  b. LHC 3/12:  sig ISR mLAD, RI 80-90%, Dx1 90% (too small for PCI);  PCI:  ION DES to mLAD, ION DES to RI  . HTN (hypertension)   . Hypercholesteremia   . CVA (cerebral infarction)   . CRI (chronic renal insufficiency)     Baseline creatinine of 1.4 to 1.7  . 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 Atrial fibrillation 11/13/2011    coumadin and amiodarone; s/p DCCV November 2013; repeat DCCV Feb 13, 2012  . Left atrial thrombus     TEE 11/2011  . Iron deficiency anemia   . Cardiomyopathy, ischemic     echo 02/28/12-Ef 25%, mod mr, mild-mod TR, mod pul htn; refuses ICD  . CHF (congestive heart failure)     Social History   Social History  . Marital Status: Widowed    Spouse Name: N/A  . Number of Children: 5  . Years of Education: N/A   Occupational History  . insurance     Social History Main Topics  . Smoking status: Former Smoker    Types: Cigarettes    Quit date: 02/11/1974  . Smokeless tobacco: None  . Alcohol Use: No  . Drug Use: No  . Sexual Activity: Not Currently   Other Topics Concern  .  None   Social History Narrative    ECHO:Study Conclusions--10/30/14  - Left ventricle: The cavity size was normal. There was moderate focal basal and mild concentric hypertrophy. Systolic function was normal. The estimated ejection fraction was 15%. Diffuse hypokinesis. There is akinesis of the basal-midinferoseptal myocardium. There is akinesis of the entireinferolateral and inferior myocardium. - Aortic valve: Trileaflet; mildly thickened, mildly calcified leaflets. There was mild to moderate regurgitation. - Aorta: Aortic root dimension: 39 mm (ED). Ascending aortic diameter: 38 mm  (S). - Ascending aorta: The ascending aorta was mildly dilated. - Mitral valve: Calcified annulus. Mildly thickened leaflets . There was moderate regurgitation. - Left atrium: The atrium was severely dilated. - Right atrium: The atrium was severely dilated. - Tricuspid valve: There was moderate regurgitation. - Pulmonary arteries: PA peak pressure: 54 mm Hg (S).  Impressions:  - The right ventricular systolic pressure was increased consistent with moderate pulmonary hypertension.  Echocardiography. M-mode, complete 2D, spectral Doppler, and color Doppler. Birthdate: Patient birthdate: 1945/08/14. Age: Patient is 69 yr old. Sex: Gender: male.  BMI: 20.8 kg/m^2. Blood pressure:   122/81 Patient status: Inpatient. Study date: Study date: 10/30/2014. Study time: 02:40 PM. Location: Bedside.  BNP    Component Value Date/Time   BNP 2688.1* 10/29/2014 2054    ProBNP    Component Value Date/Time   PROBNP 1711.0* 03/18/2012 1641     Education Assessment and Provision:  Detailed education and instructions provided on heart failure disease management including the following:  Signs and symptoms of Heart Failure When to call the physician Importance of daily weights Low sodium diet Fluid restriction Medication management Anticipated future follow-up appointments  Patient education given on each of the above topics.  Patient acknowledges understanding and acceptance of all instructions.  I spoke briefly with Erik Adams regarding his HF.  He tells me that his physician reduced his Lasix from tid to daily.  He also so says that he believes that is the reason he need to be hospitalized.  He tells me that he has a scale and he weighs daily.  He says that his weight is typically around 140lbs.  He says that he and his son "cook for breakfast" and usually "bring something in" for supper.   I reviewed a low sodium diet and high sodium foods to avoid.  He denies any  issues with getting or taking prescribed medications.  He will follow at [facility]  I encouraged him to call me with any concerns or questions related to his HF after discharge.  Education Materials:  "Living Better With Heart Failure" Booklet, Daily Weight Tracker Tool   High Risk Criteria for Readmission and/or Poor Patient Outcomes:   EF <30%- newly decreased to 15%  2 or more admissions in 6 months- 2/6  Difficult social situation- No-lives with son in [place]  Demonstrates medication noncompliance- denies ?    Barriers of Care:  Knowledge, compliance and very poor health literacy  Discharge Planning:   Plans to return to his home with son.  He would benefit from ongoing support and [facility] consult.

## 2014-10-31 NOTE — Telephone Encounter (Signed)
TCM Phone call. Appt on 11/09/14 at 11am w/ Rhonda Barrett. At [employer]

## 2014-10-31 NOTE — Progress Notes (Signed)
PROGRESS NOTE  Erik Adams UJW:[id number] DOB: [birth year] DOA: 10/29/2014 PCP: No PCP Per Patient Brief History 69 y.o. 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 Per the cardiology notes, he has been resistant to having a defibrillator placed and he has had some problems with compliance with his medications. By the time of admission, the patient was noted to be hypoxic with oxygen saturation 88% in the emergency department. Chest x-ray showed bilateral pleural effusion with pulmonary vascular congestion. He also admits to eating bacon several times a week and occasional canned goods and pizza. Assessment/Plan: Acute on chronic systolic and diastolic CHF -Multifactorial including poorly controlled HTN, dietary indiscretion as well as concerns about the patient's compliance regarding his medications -He seems to have some confusion, poor insight regarding his numerous cardiac medications upon further questioning -Clinically improved with IV furosemide--hold today as pt appears more euvolemic and serum creatinine improved -Daily weights -NEG 8 pounds since admission  -Question accuracy of I/Os--negative 1.2L since admit -Continue carvedilol, hydralazine, Imdur  -cannot use ACE due to renal failure -Echocardiogram showed EF 15% with diffuse HK and dilated aortic root at 39 mm -cardiology was consulted due to EF drop (40%--->15%) Paroxysmal Atrial fibrillation with history of left atrial thrombus  -Rate  Controlled  -Continue carvedilol  -Continue warfarin  -INR is therapeutic  -continue amiodarone -TSH is 0.015 which may be contributing to his atrial fibrillation-->decreased synthroid dose  Hypothyroidism -TSH is suppressed -Decreased dose of levothyroxine -Recheck TSH in one month  Hypertension -I suspect the patient has been missing some of his medications -Please note that the patient's blood pressure is well controlled since admission and was poorly controlled at the time of admission -also partly contributing to the patient's decompensated CHF  Ischemic cardiomyopathy/CAD  -08/26/2014 echocardiogram EF 35-40 percent, grade 1 DD, diffuse HK -Continue anticoagulation  -Continue aspirin  -status post bare metal stent to the LAD in 2000, multiple drug-eluting stents placed in 2012  Elevated troponin  -This has been chronic  -secondary to the patient's CKD as well as decompensated CHF  -EKG without concerning ischemic changes   Abnormal right lower lobe density  -Repeat chest x-ray, PA and lateral   Acute on chronic renal failure CKD stage 3-4 -baseline creatinine 1.5-1.7 -monitor creatinine with diuresis -increase creatinine due to diuresis  History of stroke, memory deficits and abnormal affect - Continue aspirin - Patient has follow-up at [facility]    Family Communication:   Pt at beside Disposition Plan:   Home when cleared by cardiology     Procedures/Studies: Dg Chest 2 View  10/31/2014   CLINICAL DATA:  69 year old male with congestive heart failure  EXAM: CHEST  2 VIEW  COMPARISON:  Prior chest x-ray 10/29/2014  FINDINGS: Stable cardiomegaly. Metallic stents project over the left heart. Atherosclerotic calcifications again noted in the transverse aorta. The lungs are hyperinflated with central bronchitic change and mild upper lung predominant emphysema. Small bilateral pleural effusions have largely resolved. Focus of probable  pleural parenchymal scarring in the periphery  of the right base appears less nodular on today's examination. No pulmonary edema. No acute osseous abnormality.  IMPRESSION: 1. Resolved bilateral pleural effusions. 2. The nodular density in the periphery of the right lung base appears more linear and scar-like on today's evaluation. Recommend 1 additional follow-up chest x-ray in 4 weeks to exclude a true underlying pulmonary nodule. 3. Stable cardiomegaly without evidence of pulmonary edema.   Electronically Signed   By: Malachy Moan M.D.   On: 10/31/2014 07:46   Dg Chest 2 View  10/29/2014   CLINICAL DATA:  Shortness of breath, cough.  EXAM: CHEST  2 VIEW  COMPARISON:  08/17/2014  FINDINGS: Cardiomediastinal silhouette is enlarged. Mediastinal contours appear intact. Thoracic aorta is torturous and contains atherosclerotic calcifications. Coronary artery stents are seen.  There is no evidence of pneumothorax. There are small bilateral pleural effusions. There is mild coarsening of the interstitial markings. There is a 15 mm opacity seen overlying the periphery of the right lower lung on the frontal view.  Osseous structures are without acute abnormality. Soft tissues are grossly normal.  IMPRESSION: Cardiomegaly.  Small bilateral pleural effusions, and mild pulmonary vascular congestion.  15 mm soft tissue density overlying the right lower lung. This may represent an area of round atelectasis, however follow-up with PA and lateral radiograph, or chest CT, is recommended to assure resolution, as subpleural soft tissue mass cannot be excluded.   Electronically Signed   By: Ted Mcalpine M.D.   On: 10/29/2014 20:12         Subjective: Patient denies fevers, chills, headache, chest pain, dyspnea, nausea, vomiting, diarrhea, abdominal pain, dysuria, hematuria   Objective: Filed Vitals:   10/31/14 0000 10/31/14 0445 10/31/14 0500 10/31/14 0647  BP: 130/86 133/81  137/89  Pulse: 70 73  88    Temp: 98.8 F (37.1 C) 97.8 F (36.6 C)    TempSrc:  Oral    Resp: 18 18    Height:      Weight:   64.638 kg (142 lb 8 oz)   SpO2: 100% 93%      Intake/Output Summary (Last 24 hours) at 10/31/14 0805 Last data filed at 10/31/14 0600  Gross per 24 hour  Intake    703 ml  Output   1250 ml  Net   -547 ml   Weight change: -3.402 kg (-7 lb 8 oz) Exam:   General:  Pt is alert, follows commands appropriately, not in acute distress  HEENT: No icterus, No thrush, No neck mass, [place]/AT  Cardiovascular: IRRR, S1/S2, no rubs, no gallops  Respiratory: CTA bilaterally, no wheezing, no crackles, no rhonchi  Abdomen: Soft/+BS, non tender, non distended, no guarding  Extremities: No edema, No lymphangitis, No petechiae, No rashes, no synovitis  Data Reviewed: Basic Metabolic Panel:  Recent Labs Lab 10/29/14 1823 10/31/14 0354  NA 135 135  K 3.7 2.8*  CL 97* 95*  CO2 27 29  GLUCOSE 120* 97  BUN 36* 36*  CREATININE 2.03* 2.21*  CALCIUM 8.7* 8.3*   Liver Function Tests: No results for input(s): AST, ALT, ALKPHOS, BILITOT, PROT, ALBUMIN in the last 168 hours. No results for input(s): LIPASE, AMYLASE in the last 168 hours. No results for input(s): AMMONIA in the last 168 hours. CBC:  Recent Labs Lab 10/29/14 1823  WBC 8.1  HGB 10.3*  HCT 33.4*  MCV 83.3  PLT 211   Cardiac Enzymes:  Recent Labs Lab 10/29/14 2054 10/30/14 0332 10/30/14 1015 10/30/14 1435  TROPONINI 0.17*  0.16* 0.20* 0.20*   BNP: Invalid input(s): POCBNP CBG: No results for input(s): GLUCAP in the last 168 hours.  No results found for this or any previous visit (from the past 240 hour(s)).   Scheduled Meds: . amiodarone  300 mg Oral Daily  . aspirin  81 mg Oral Daily  . carvedilol  25 mg Oral BID WC  . digoxin  0.0625 mg Oral Daily  . doxazosin  1 mg Oral QHS  . hydrALAZINE  100 mg Oral 3 times per day  . isosorbide mononitrate  60 mg Oral BID WC  . ketorolac  1 drop Left Eye TID AC &  HS  . levothyroxine  25 mcg Oral QAC breakfast  . potassium chloride  40 mEq Oral Q4H  . rosuvastatin  20 mg Oral Daily  . sodium chloride  3 mL Intravenous Q12H  . Warfarin - Pharmacist Dosing Inpatient   Does not apply q1800   Continuous Infusions:    TAT, DAVID, DO  [facility] Pager [phone]  If 7PM-7AM, please contact night-coverage [email or web address] Password [email or web address] 10/31/2014, 8:05 AM   LOS: 1 day

## 2014-10-31 NOTE — Progress Notes (Signed)
Nutrition Education Note  RD consulted for nutrition education regarding CHF and for assessment of nutrition status.  RD provided "Heart Failure Nutrition Therapy" handout from the Academy of Nutrition and Dietetics. Reviewed patient's dietary recall. Provided examples on ways to decrease sodium intake in diet. Discouraged intake of processed foods and use of salt shaker. Encouraged fresh fruits and vegetables as well as whole grain sources of carbohydrates to maximize fiber intake.   RD discussed why it is important for patient to adhere to diet recommendations, and emphasized the role of fluids, foods to avoid, and importance of weighing self daily. Teach back method used. Pt admits to adding salt to his food and will try to cut back. Discussed ways to flavor food without salt.   Expect fair compliance.  Body mass index is 20.45 kg/(m^2). Pt meets criteria for Normal weight based on current BMI. Pt reports losing 40 lbs over the past 2-3 years. He states he has no appetite and only eats 2 small meals daily. RD discussed ways to prevent further weight loss and improve nutrition. Encouraged snacking throughout the day especially when skipping lunch. Provided "Healthy Snacks for Adults and Teenagers" handout from the Academy of Nutrition and Dietetics.   Current diet order is Low Sodium, patient is consuming approximately 75-100% of meals at this time. Labs and medications reviewed. No further nutrition interventions warranted at this time. RD contact information provided. If additional nutrition issues arise, please re-consult RD.   Dorothea Ogle RD, LDN Inpatient Clinical Dietitian Pager: [phone] After Hours Pager: [phone]

## 2014-10-31 NOTE — Consult Note (Signed)
Primary cardiologist: Dr Claiborne Billings  HPI: 69 year old male with past medical history of coronary artery disease, atrial fibrillation, ischemic cardiomyopathy, chronic stage III renal insufficiency, prior CVA for evaluation of acute on chronic systolic congestive heart failure. Nuclear study January 2014 showed ejection fraction 32%, prior inferior/inferior lateral infarct but no ischemia. Previous echocardiogram July 2016 showed ejection fraction 36-14%, grade 1 diastolic dysfunction, mild aortic insufficiency and mitral regurgitation, biatrial enlargement, moderate tricuspid regurgitation and moderately elevated pulmonary pressures. Patient apparently has a history of confusion with medications. Patient was admitted September 17 with complaints of 2 days dyspnea on exertion, orthopnea and PND. He denied chest pain or pedal edema. No palpitations or syncope. He has been diuresed with improvement in his symptoms. Echocardiogram shows decreased LV function compared to previous. Cardiology now asked to evaluate.  Medications Prior to Admission  Medication Sig Dispense Refill  . amiodarone (PACERONE) 200 MG tablet Take 1.5 tablets (300 mg total) by mouth daily. 45 tablet 6  . aspirin 81 MG chewable tablet Chew 1 tablet (81 mg total) by mouth daily.    . carvedilol (COREG) 25 MG tablet Take 25 mg by mouth 2 (two) times daily with a meal.    . digoxin (LANOXIN) 0.125 MG tablet Take 0.5 tablets (0.0625 mg total) by mouth daily. 30 tablet 3  . doxazosin (CARDURA) 1 MG tablet Take 1 tablet (1 mg total) by mouth at bedtime. 30 tablet 6  . furosemide (LASIX) 40 MG tablet     . hydrALAZINE (APRESOLINE) 50 MG tablet Take 100 mg by mouth 3 (three) times daily.     . isosorbide mononitrate (IMDUR) 60 MG 24 hr tablet Take 60 mg by mouth 2 (two) times daily.    Marland Kitchen ketorolac (ACULAR) 0.5 % ophthalmic solution Place 1 drop into the left eye 4 (four) times daily.    Marland Kitchen levothyroxine (SYNTHROID, LEVOTHROID) 50 MCG tablet  Take 1 tablet (50 mcg total) by mouth daily before breakfast. 30 tablet 6  . nitroGLYCERIN (NITROSTAT) 0.4 MG SL tablet Place 1 tablet (0.4 mg total) under the tongue every 5 (five) minutes x 3 doses as needed. For chest pain 25 tablet 6  . potassium chloride SA (K-DUR,KLOR-CON) 20 MEQ tablet Take 1 tablet (20 mEq total) by mouth daily. 30 tablet 6  . rosuvastatin (CRESTOR) 20 MG tablet Take 1 tablet (20 mg total) by mouth daily. 30 tablet 6  . warfarin (COUMADIN) 1 MG tablet Please do not take Coumadin until you see PCP as your INR is 3.52, You must see your PCP in 1 day to have PT/INR checked 10 tablet 0  . warfarin (COUMADIN) 5 MG tablet TAKE 1 TABLET DAILY OR AS DIRECTED. 90 tablet 0    Allergies  Allergen Reactions  . Amlodipine Shortness Of Breath, Swelling and Palpitations    "Caused  Heart condition-per patient"  . Ace Inhibitors Other (See Comments)    Unknown reaction  . Clonidine Derivatives Other (See Comments)    Unknown reaction     Past Medical History  Diagnosis Date  . CAD (coronary artery disease)     myoview 03/02/12-EF 32%, mod fixed defect in the inf wall without significant ischemia; a. s/p BMS to mLAD 2000;  b. LHC 3/12:  sig ISR mLAD, RI 80-90%, Dx1 90% (too small for PCI);  PCI:  ION DES to mLAD, ION DES to RI  . HTN (hypertension)   . Hypercholesteremia   . CVA (cerebral infarction)   . CRI (chronic renal  insufficiency)     Baseline creatinine of 1.4 to 1.7  . 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 Atrial fibrillation 11/13/2011    coumadin and amiodarone; s/p DCCV November 2013; repeat DCCV Feb 13, 2012  . Left atrial thrombus     TEE 11/2011  . Iron deficiency anemia   . Cardiomyopathy, ischemic     echo 02/28/12-Ef 25%, mod mr, mild-mod TR, mod pul htn; refuses ICD  . CHF (congestive heart failure)     Past Surgical History  Procedure Laterality Date  . Coronary angioplasty with stent placement  2000    BMS to the LAD  .  Mastoidectomy    . Coronary angioplasty with stent placement  March 2012    DES to LAD and Ramus intermdius  . Tee without cardioversion  12/06/2011    Procedure: TRANSESOPHAGEAL ECHOCARDIOGRAM (TEE);  Surgeon: Fay Records, MD;  Location: [facility] ENDOSCOPY;  Service: Cardiovascular;  Laterality: N/A;  . Cardioversion  01/06/2012    Procedure: CARDIOVERSION;  Surgeon: Josue Hector, MD;  Location: [facility] ENDOSCOPY;  Service: Cardiovascular;  Laterality: N/A;  call anesthesia  maybe able to do cardio. early at 12:00  . Cardioversion  02/13/2012    Procedure: CARDIOVERSION;  Surgeon: Darlin Coco, MD;  Location: [facility] ENDOSCOPY;  Service: Cardiovascular;  Laterality: N/A;  . Tonsillectomy    . Cholecystectomy  1999  . Left inguinal hernia repair  02/2007  . Hernia repair    . Coronary angioplasty with stent placement  04/2000    DES to LAD and ramus intermediate vessel    Social History   Social History  . Marital Status: Widowed    Spouse Name: N/A  . Number of Children: 5  . Years of Education: N/A   Occupational History  . insurance     Social History Main Topics  . Smoking status: Former Smoker    Types: Cigarettes    Quit date: 02/11/1974  . Smokeless tobacco: Not on file  . Alcohol Use: No  . Drug Use: No  . Sexual Activity: Not Currently   Other Topics Concern  . Not on file   Social History Narrative    Family History  Problem Relation Age of Onset  . Heart disease Father   . Heart failure Neg Hx     ROS:  no fevers or chills, productive cough, hemoptysis, dysphasia, odynophagia, melena, hematochezia, dysuria, hematuria, rash, seizure activity, orthopnea, PND, pedal edema, claudication. Remaining systems are negative.  Physical Exam:   Blood pressure 137/89, pulse 88, temperature 97.8 F (36.6 C), temperature source Oral, resp. rate 18, height _0  (1.778 m), weight 64.638 kg (142 lb 8 oz), SpO2 93 %.  General:  Well developed/well nourished in NAD Skin  warm/dry Patient not depressed No peripheral clubbing Back-normal HEENT-normal/normal eyelids Neck supple/normal carotid upstroke bilaterally; no bruits; no JVD; no thyromegaly chest - CTA/ normal expansion CV - irregular/normal S1 and S2; no rubs or gallops;  PMI nondisplaced, 2/6 systolic murmur LSB Abdomen -NT/ND, no HSM, no mass, + bowel sounds, no bruit 2+ femoral pulses, no bruits Ext-no edema, chords, 2+ DP Neuro-grossly nonfocal  ECG atrial fibrillation, left bundle branch block.   Results for orders placed or performed during the hospital encounter of 10/29/14 (from the past 48 hour(s))  Basic metabolic panel     Status: Abnormal   Collection Time: 10/29/14  6:23 PM  Result Value Ref Range   Sodium 135 135 - 145 mmol/L  Potassium 3.7 3.5 - 5.1 mmol/L   Chloride 97 (L) 101 - 111 mmol/L   CO2 27 22 - 32 mmol/L   Glucose, Bld 120 (H) 65 - 99 mg/dL   BUN 36 (H) 6 - 20 mg/dL   Creatinine, Ser 2.03 (H) 0.61 - 1.24 mg/dL   Calcium 8.7 (L) 8.9 - 10.3 mg/dL   GFR calc non Af Amer 32 (L) >60 mL/min   GFR calc Af Amer 37 (L) >60 mL/min    Comment: (NOTE) The eGFR has been calculated using the CKD EPI equation. This calculation has not been validated in all clinical situations. eGFR's persistently <60 mL/min signify possible Chronic Kidney Disease.    Anion gap 11 5 - 15  CBC     Status: Abnormal   Collection Time: 10/29/14  6:23 PM  Result Value Ref Range   WBC 8.1 4.0 - 10.5 K/uL   RBC 4.01 (L) 4.22 - 5.81 MIL/uL   Hemoglobin 10.3 (L) 13.0 - 17.0 g/dL   HCT 33.4 (L) 39.0 - 52.0 %   MCV 83.3 78.0 - 100.0 fL   MCH 25.7 (L) 26.0 - 34.0 pg   MCHC 30.8 30.0 - 36.0 g/dL   RDW 16.4 (H) 11.5 - 15.5 %   Platelets 211 150 - 400 K/uL  Digoxin level     Status: Abnormal   Collection Time: 10/29/14  6:23 PM  Result Value Ref Range   Digoxin Level <0.2 (L) 0.8 - 2.0 ng/mL    Comment: RESULTS CONFIRMED BY MANUAL DILUTION  Protime-INR     Status: Abnormal   Collection Time:  10/29/14  6:23 PM  Result Value Ref Range   Prothrombin Time 28.1 (H) 11.6 - 15.2 seconds   INR 2.68 (H) 0.00 - 1.49  I-stat troponin, ED     Status: Abnormal   Collection Time: 10/29/14  6:31 PM  Result Value Ref Range   Troponin i, poc 0.18 (HH) 0.00 - 0.08 ng/mL   Comment NOTIFIED PHYSICIAN    Comment 3            Comment: Due to the release kinetics of cTnI, a negative result within the first hours of the onset of symptoms does not rule out myocardial infarction with certainty. If myocardial infarction is still suspected, repeat the test at appropriate intervals.   Brain natriuretic peptide     Status: Abnormal   Collection Time: 10/29/14  8:54 PM  Result Value Ref Range   B Natriuretic Peptide 2688.1 (H) 0.0 - 100.0 pg/mL  Troponin I     Status: Abnormal   Collection Time: 10/29/14  8:54 PM  Result Value Ref Range   Troponin I 0.17 (H) <0.031 ng/mL    Comment:        PERSISTENTLY INCREASED TROPONIN VALUES IN THE RANGE OF 0.04-0.49 ng/mL CAN BE SEEN IN:       -UNSTABLE ANGINA       -CONGESTIVE HEART FAILURE       -MYOCARDITIS       -CHEST TRAUMA       -ARRYHTHMIAS       -LATE PRESENTING MYOCARDIAL INFARCTION       -COPD   CLINICAL FOLLOW-UP RECOMMENDED.   TSH     Status: Abnormal   Collection Time: 10/30/14  3:32 AM  Result Value Ref Range   TSH 0.015 (L) 0.350 - 4.500 uIU/mL  Troponin I     Status: Abnormal   Collection Time: 10/30/14  3:32 AM  Result  Value Ref Range   Troponin I 0.16 (H) <0.031 ng/mL    Comment:        PERSISTENTLY INCREASED TROPONIN VALUES IN THE RANGE OF 0.04-0.49 ng/mL CAN BE SEEN IN:       -UNSTABLE ANGINA       -CONGESTIVE HEART FAILURE       -MYOCARDITIS       -CHEST TRAUMA       -ARRYHTHMIAS       -LATE PRESENTING MYOCARDIAL INFARCTION       -COPD   CLINICAL FOLLOW-UP RECOMMENDED.   Protime-INR     Status: Abnormal   Collection Time: 10/30/14  4:55 AM  Result Value Ref Range   Prothrombin Time 26.8 (H) 11.6 - 15.2 seconds    INR 2.51 (H) 0.00 - 1.49  Troponin I     Status: Abnormal   Collection Time: 10/30/14 10:15 AM  Result Value Ref Range   Troponin I 0.20 (H) <0.031 ng/mL    Comment:        PERSISTENTLY INCREASED TROPONIN VALUES IN THE RANGE OF 0.04-0.49 ng/mL CAN BE SEEN IN:       -UNSTABLE ANGINA       -CONGESTIVE HEART FAILURE       -MYOCARDITIS       -CHEST TRAUMA       -ARRYHTHMIAS       -LATE PRESENTING MYOCARDIAL INFARCTION       -COPD   CLINICAL FOLLOW-UP RECOMMENDED.   Troponin I     Status: Abnormal   Collection Time: 10/30/14  2:35 PM  Result Value Ref Range   Troponin I 0.20 (H) <0.031 ng/mL    Comment:        PERSISTENTLY INCREASED TROPONIN VALUES IN THE RANGE OF 0.04-0.49 ng/mL CAN BE SEEN IN:       -UNSTABLE ANGINA       -CONGESTIVE HEART FAILURE       -MYOCARDITIS       -CHEST TRAUMA       -ARRYHTHMIAS       -LATE PRESENTING MYOCARDIAL INFARCTION       -COPD   CLINICAL FOLLOW-UP RECOMMENDED.   Basic metabolic panel     Status: Abnormal   Collection Time: 10/31/14  3:54 AM  Result Value Ref Range   Sodium 135 135 - 145 mmol/L   Potassium 2.8 (L) 3.5 - 5.1 mmol/L    Comment: DELTA CHECK NOTED   Chloride 95 (L) 101 - 111 mmol/L   CO2 29 22 - 32 mmol/L   Glucose, Bld 97 65 - 99 mg/dL   BUN 36 (H) 6 - 20 mg/dL   Creatinine, Ser 2.21 (H) 0.61 - 1.24 mg/dL   Calcium 8.3 (L) 8.9 - 10.3 mg/dL   GFR calc non Af Amer 29 (L) >60 mL/min   GFR calc Af Amer 33 (L) >60 mL/min    Comment: (NOTE) The eGFR has been calculated using the CKD EPI equation. This calculation has not been validated in all clinical situations. eGFR's persistently <60 mL/min signify possible Chronic Kidney Disease.    Anion gap 11 5 - 15  Protime-INR     Status: Abnormal   Collection Time: 10/31/14  3:54 AM  Result Value Ref Range   Prothrombin Time 24.6 (H) 11.6 - 15.2 seconds   INR 2.25 (H) 0.00 - 1.49    Dg Chest 2 View  10/31/2014   CLINICAL DATA:  69 year old male with congestive heart  failure  EXAM: CHEST  2 VIEW  COMPARISON:  Prior chest x-ray 10/29/2014  FINDINGS: Stable cardiomegaly. Metallic stents project over the left heart. Atherosclerotic calcifications again noted in the transverse aorta. The lungs are hyperinflated with central bronchitic change and mild upper lung predominant emphysema. Small bilateral pleural effusions have largely resolved. Focus of probable pleural parenchymal scarring in the periphery of the right base appears less nodular on today's examination. No pulmonary edema. No acute osseous abnormality.  IMPRESSION: 1. Resolved bilateral pleural effusions. 2. The nodular density in the periphery of the right lung base appears more linear and scar-like on today's evaluation. Recommend 1 additional follow-up chest x-ray in 4 weeks to exclude a true underlying pulmonary nodule. 3. Stable cardiomegaly without evidence of pulmonary edema.   Electronically Signed   By: Jacqulynn Cadet M.D.   On: 10/31/2014 07:46   Dg Chest 2 View  10/29/2014   CLINICAL DATA:  Shortness of breath, cough.  EXAM: CHEST  2 VIEW  COMPARISON:  08/17/2014  FINDINGS: Cardiomediastinal silhouette is enlarged. Mediastinal contours appear intact. Thoracic aorta is torturous and contains atherosclerotic calcifications. Coronary artery stents are seen.  There is no evidence of pneumothorax. There are small bilateral pleural effusions. There is mild coarsening of the interstitial markings. There is a 15 mm opacity seen overlying the periphery of the right lower lung on the frontal view.  Osseous structures are without acute abnormality. Soft tissues are grossly normal.  IMPRESSION: Cardiomegaly.  Small bilateral pleural effusions, and mild pulmonary vascular congestion.  15 mm soft tissue density overlying the right lower lung. This may represent an area of round atelectasis, however follow-up with PA and lateral radiograph, or chest CT, is recommended to assure resolution, as subpleural soft tissue mass  cannot be excluded.   Electronically Signed   By: Fidela Salisbury M.D.   On: 10/29/2014 20:12    Assessment/Plan 1 acute on chronic systolic congestive heart failure-the patient presented with acute heart failure, pulmonary edema on chest x-ray and elevated BNP. Note he is not compliant with low-sodium diet with fluid restriction. I discussed the importance of both of these. I also asked him to weigh himself daily at home and take additional 40 mg of Lasix for weight gain of 2 pounds. He is back to baseline from a symptomatic standpoint. Would resume Lasix at preadmission dose (80 mg daily). Patient can be discharged from a cardiac standpoint and follow up with Dr. Claiborne Billings. Would check potassium and renal function in 1 week with results to Dr. Claiborne Billings. 2 ischemic cardiomyopathy-LV function is reduced. Continue beta blocker, low-dose digoxin and hydralazine/nitrates. No ACE inhibitor or ARB given renal insufficiency and renal artery stenosis. Note patient was hypertensive when he presented and I wonder about compliance with medications. Follow-up with Dr. Claiborne Billings after discharge for further medication titration. Once fully titrated and blood pressure controlled can repeat echocardiogram to reassess LV function. If ejection fraction less than 35% could consider ICD although he has declined this in the past. 3 Paroxysmal atrial fibrillation-patient is noted to be in atrial fibrillation. This was also present in August but he was in sinus in July. This certainly could be contributing to his congestive heart failure. However his TSH is decreased and his atria are significantly enlarged. I am not convinced he would hold sinus at this point. Decrease amiodarone to 200 mg daily. Continue Coumadin (Chadsvasc 6). His Synthroid has been reduced. He will follow-up with Dr. Claiborne Billings after discharge. Once he is more stable and TSH normalizes could consider repeating  cardioversion to see if he will hold sinus rhythm. 4 elevated  troponin-the patient has not had chest pain and there is no clear trend. Troponin is not consistent with an acute coronary syndrome. No further ischemia evaluation. 5 chronic stage III renal failure-follow renal function closely after discharge. 6 hypertension-blood pressure has improved following reinitiation of medications. 7 coronary artery disease-continue statin. Given need for Coumadin discontinue aspirin. 8 hyperlipidemia-continue statin. 9 Abnormal chest x-ray-follow-up for primary care. 10 hypothyroidism-TSH low. Synthroid has been reduced.  Kirk Ruths MD 10/31/2014, 8:50 AM

## 2014-11-01 NOTE — Patient Outreach (Addendum)
[facility]) Care Management  11/01/2014  JUTIN MISKO 1945/08/01 [id number]   Referral from Hospital with diagnosis of Heart Failure, member is not eligible for [facility] Care Management services.  Reason:  Not a beneficiary currently attributed to one of the [facility] ACO Registry populations.  Membership roster used to verify non- eligible status  Thanks, Corrie Mckusick. Sharlee Blew [facility] Care Management [facility] CM Assistant Phone: [phone] Fax: [phone]

## 2014-11-04 ENCOUNTER — Telehealth (HOSPITAL_COMMUNITY): Payer: Self-pay | Admitting: Surgery

## 2014-11-04 NOTE — Telephone Encounter (Signed)
Heart Failure Nurse Navigator Post Discharge Telephone Call  I called to check on [name] after his recent hospitalization.  He tells me that he is doing "fine".  He says that he weighed today however cannot recall exact number.  He says he thinks it was "140 something".  Of note he was 142.5 lbs on 10/31/14- date of discharge.  He says that he has a follow-up appt scheduled on Wednesday Sept 28th --however says he may not keep it because "it is not with a doctor".   I encouraged him to keep the appt as it was his opportunity to get checked out and make sure things are still going well after his discharge from the hospital.  He now says that he most likely will go.  He denies any issues with getting or taking prescribed medications.  I have also encouraged him to call me back with any concerns or questions related to his HF.

## 2014-11-08 LAB — DIGOXIN LEVEL

## 2014-11-08 LAB — COMPREHENSIVE METABOLIC PANEL
ALBUMIN: 3.6 g/dL (ref 3.6–5.1)
ALK PHOS: 64 U/L (ref 40–115)
ALT: 21 U/L (ref 9–46)
AST: 24 U/L (ref 10–35)
BUN: 36 mg/dL — ABNORMAL HIGH (ref 7–25)
CALCIUM: 9.1 mg/dL (ref 8.6–10.3)
CO2: 29 mmol/L (ref 20–31)
Chloride: 103 mmol/L (ref 98–110)
Creat: 1.84 mg/dL — ABNORMAL HIGH (ref 0.70–1.18)
GLUCOSE: 94 mg/dL (ref 65–99)
Potassium: 4.4 mmol/L (ref 3.5–5.3)
Sodium: 144 mmol/L (ref 135–146)
Total Bilirubin: 1 mg/dL (ref 0.2–1.2)
Total Protein: 5.7 g/dL — ABNORMAL LOW (ref 6.1–8.1)

## 2014-11-08 LAB — CBC
HEMATOCRIT: 32.8 % — AB (ref 39.0–52.0)
HEMOGLOBIN: 10.1 g/dL — AB (ref 13.0–17.0)
MCH: 25.2 pg — ABNORMAL LOW (ref 26.0–34.0)
MCHC: 30.8 g/dL (ref 30.0–36.0)
MCV: 81.8 fL (ref 78.0–100.0)
MPV: 10.1 fL (ref 8.6–12.4)
Platelets: 256 10*3/uL (ref 150–400)
RBC: 4.01 MIL/uL — AB (ref 4.22–5.81)
RDW: 16 % — ABNORMAL HIGH (ref 11.5–15.5)
WBC: 7.7 10*3/uL (ref 4.0–10.5)

## 2014-11-08 LAB — TSH: TSH: 0.008 u[IU]/mL — ABNORMAL LOW (ref 0.350–4.500)

## 2014-11-08 LAB — BRAIN NATRIURETIC PEPTIDE: BRAIN NATRIURETIC PEPTIDE: 1744.3 pg/mL — AB (ref 0.0–100.0)

## 2014-11-09 ENCOUNTER — Telehealth: Payer: Self-pay | Admitting: *Deleted

## 2014-11-09 ENCOUNTER — Ambulatory Visit: Payer: Medicare Other | Admitting: Physician Assistant

## 2014-11-09 NOTE — Telephone Encounter (Signed)
Walk-in patient encounter - patient requested advice on problem he has been having.  States over past 4 nights, issue of shortness of breath when lying down to rest. Stated no SOB throughout day, states no history of sleep d/o, CPAP use, etc.  He takes 120mg  lasix per day. Noted no problems w/ swelling. No evidence of edema on nursing assessment.  He reports sleep pattern of going to bed around 2-3am and waking at noon. Generally per his report, he "goes to bed" at 2 or 3am and actually falls asleep around 5 or 6am.  Spoke to Dr. Tresa Endo - familiar w/ patient concerns, he asked that i verify meds being taken correctly as he is aware that patient has a history of not complying well to regimen. Also recommended discussion w/ Phillips Hay for any additional advice, pt followed through our coumadin clinic.  Discussed meds w/ patient. He reports taking his lasix early afternoon after waking up, takes all 120mg  at once. Advised spreading out dose - for now, 80mg  in early afternoon (pt's "AM" dose), 40mg  later in evening (4-5 hrs before bedtime). Informed him we could do further adjustment as warranted. Asked that he call in a few days w/ progress or sooner if concerns, intent is that spreading the dose should help if he is having fluid gain later in the day.   Pt expressed understanding of rationale and instructions given.  Routing to Terrell for her review/information.

## 2014-11-11 ENCOUNTER — Other Ambulatory Visit: Payer: Self-pay | Admitting: *Deleted

## 2014-11-11 MED ORDER — FUROSEMIDE 40 MG PO TABS: ORAL_TABLET | ORAL | Status: DC

## 2014-11-11 NOTE — Telephone Encounter (Signed)
Rx(s) sent to pharmacy electronically.  

## 2014-11-21 ENCOUNTER — Other Ambulatory Visit: Payer: Self-pay | Admitting: Cardiovascular Disease

## 2014-11-21 MED ORDER — FUROSEMIDE 40 MG PO TABS: ORAL_TABLET | ORAL | Status: DC

## 2014-11-21 NOTE — Telephone Encounter (Signed)
Spoke with pt son, Fish farm manager, pt has a cough, but can not get anything up. pt has no swelling by son's report but is not able to lie flat because he can't breathe. Aware the pt has never had zaroxolyn before, family is aware but they feel the lasix is not working and he needs zaroxolyn. Spoke with pt, this has been going on for about one week now. Offered an appointment for pt to be seen tomorrow but pt refuses to come into the office. The family would like something done tonight, they were told they would have to call back to talk to the person on call, I can not give him zaroxolyn tonight. They want to make dr Tresa Endo aware of the pts condition. Will forward to dr Tresa Endo

## 2014-11-21 NOTE — Telephone Encounter (Signed)
His daughter wanted this called in today please.

## 2014-11-21 NOTE — Telephone Encounter (Signed)
Pt need Zoroxolyn called in asap please.Please call to [phone].

## 2014-11-22 ENCOUNTER — Emergency Department (HOSPITAL_COMMUNITY): Payer: Medicare Other

## 2014-11-22 ENCOUNTER — Inpatient Hospital Stay (HOSPITAL_COMMUNITY)
Admission: EM | Admit: 2014-11-22 | Discharge: 2014-11-27 | DRG: 291 | Disposition: A | Discharge status: Home / Self Care | Payer: Medicare Other | Attending: Family Medicine | Admitting: Family Medicine

## 2014-11-22 ENCOUNTER — Encounter (HOSPITAL_COMMUNITY): Payer: Self-pay | Admitting: Emergency Medicine

## 2014-11-22 ENCOUNTER — Telehealth: Payer: Self-pay | Admitting: Cardiovascular Disease

## 2014-11-22 DIAGNOSIS — I5043 Acute on chronic combined systolic (congestive) and diastolic (congestive) heart failure: Secondary | ICD-10-CM | POA: Diagnosis not present

## 2014-11-22 DIAGNOSIS — I5033 Acute on chronic diastolic (congestive) heart failure: Secondary | ICD-10-CM | POA: Diagnosis present

## 2014-11-22 DIAGNOSIS — Z8673 Personal history of transient ischemic attack (TIA), and cerebral infarction without residual deficits: Secondary | ICD-10-CM

## 2014-11-22 DIAGNOSIS — N184 Chronic kidney disease, stage 4 (severe): Secondary | ICD-10-CM | POA: Diagnosis present

## 2014-11-22 DIAGNOSIS — Z955 Presence of coronary angioplasty implant and graft: Secondary | ICD-10-CM

## 2014-11-22 DIAGNOSIS — N179 Acute kidney failure, unspecified: Secondary | ICD-10-CM | POA: Diagnosis present

## 2014-11-22 DIAGNOSIS — I130 Hypertensive heart and chronic kidney disease with heart failure and stage 1 through stage 4 chronic kidney disease, or unspecified chronic kidney disease: Secondary | ICD-10-CM | POA: Diagnosis present

## 2014-11-22 DIAGNOSIS — Z79899 Other long term (current) drug therapy: Secondary | ICD-10-CM

## 2014-11-22 DIAGNOSIS — E039 Hypothyroidism, unspecified: Secondary | ICD-10-CM | POA: Diagnosis present

## 2014-11-22 DIAGNOSIS — Z66 Do not resuscitate: Secondary | ICD-10-CM | POA: Diagnosis present

## 2014-11-22 DIAGNOSIS — Z9114 Patient's other noncompliance with medication regimen: Secondary | ICD-10-CM | POA: Diagnosis not present

## 2014-11-22 DIAGNOSIS — I4891 Unspecified atrial fibrillation: Secondary | ICD-10-CM | POA: Diagnosis present

## 2014-11-22 DIAGNOSIS — Z87891 Personal history of nicotine dependence: Secondary | ICD-10-CM | POA: Diagnosis not present

## 2014-11-22 DIAGNOSIS — E875 Hyperkalemia: Secondary | ICD-10-CM | POA: Diagnosis present

## 2014-11-22 DIAGNOSIS — I2510 Atherosclerotic heart disease of native coronary artery without angina pectoris: Secondary | ICD-10-CM | POA: Diagnosis present

## 2014-11-22 DIAGNOSIS — R509 Fever, unspecified: Secondary | ICD-10-CM

## 2014-11-22 DIAGNOSIS — I5041 Acute combined systolic (congestive) and diastolic (congestive) heart failure: Secondary | ICD-10-CM | POA: Diagnosis not present

## 2014-11-22 DIAGNOSIS — Z7901 Long term (current) use of anticoagulants: Secondary | ICD-10-CM

## 2014-11-22 DIAGNOSIS — I255 Ischemic cardiomyopathy: Secondary | ICD-10-CM | POA: Diagnosis present

## 2014-11-22 DIAGNOSIS — E876 Hypokalemia: Secondary | ICD-10-CM | POA: Diagnosis present

## 2014-11-22 DIAGNOSIS — Z7982 Long term (current) use of aspirin: Secondary | ICD-10-CM | POA: Diagnosis not present

## 2014-11-22 DIAGNOSIS — R0602 Shortness of breath: Secondary | ICD-10-CM | POA: Diagnosis not present

## 2014-11-22 DIAGNOSIS — I509 Heart failure, unspecified: Secondary | ICD-10-CM

## 2014-11-22 DIAGNOSIS — I5031 Acute diastolic (congestive) heart failure: Secondary | ICD-10-CM | POA: Diagnosis not present

## 2014-11-22 LAB — URINALYSIS, ROUTINE W REFLEX MICROSCOPIC
Bilirubin Urine: NEGATIVE
GLUCOSE, UA: NEGATIVE mg/dL
Hgb urine dipstick: NEGATIVE
Ketones, ur: NEGATIVE mg/dL
LEUKOCYTES UA: NEGATIVE
Nitrite: NEGATIVE
PH: 7 (ref 5.0–8.0)
Protein, ur: NEGATIVE mg/dL
SPECIFIC GRAVITY, URINE: 1.016 (ref 1.005–1.030)
Urobilinogen, UA: 1 mg/dL (ref 0.0–1.0)

## 2014-11-22 LAB — CBC
HCT: 33.8 % — ABNORMAL LOW (ref 39.0–52.0)
HEMOGLOBIN: 10.2 g/dL — AB (ref 13.0–17.0)
MCH: 24.7 pg — AB (ref 26.0–34.0)
MCHC: 30.2 g/dL (ref 30.0–36.0)
MCV: 81.8 fL (ref 78.0–100.0)
Platelets: 284 10*3/uL (ref 150–400)
RBC: 4.13 MIL/uL — AB (ref 4.22–5.81)
RDW: 17.7 % — ABNORMAL HIGH (ref 11.5–15.5)
WBC: 9.4 10*3/uL (ref 4.0–10.5)

## 2014-11-22 LAB — BASIC METABOLIC PANEL
ANION GAP: 11 (ref 5–15)
BUN: 46 mg/dL — ABNORMAL HIGH (ref 6–20)
CHLORIDE: 102 mmol/L (ref 101–111)
CO2: 23 mmol/L (ref 22–32)
Calcium: 9.3 mg/dL (ref 8.9–10.3)
Creatinine, Ser: 2.27 mg/dL — ABNORMAL HIGH (ref 0.61–1.24)
GFR calc non Af Amer: 28 mL/min — ABNORMAL LOW (ref >60)
GFR, EST AFRICAN AMERICAN: 32 mL/min — AB (ref >60)
Glucose, Bld: 123 mg/dL — ABNORMAL HIGH (ref 65–99)
Potassium: 5.4 mmol/L — ABNORMAL HIGH (ref 3.5–5.1)
Sodium: 136 mmol/L (ref 135–145)

## 2014-11-22 LAB — PROTIME-INR
INR: 2.27 — ABNORMAL HIGH (ref 0.00–1.49)
PROTHROMBIN TIME: 24.8 s — AB (ref 11.6–15.2)

## 2014-11-22 LAB — I-STAT TROPONIN, ED: TROPONIN I, POC: 0.07 ng/mL (ref 0.00–0.08)

## 2014-11-22 LAB — APTT: APTT: 39 s — AB (ref 24–37)

## 2014-11-22 LAB — CBG MONITORING, ED: Glucose-Capillary: 99 mg/dL (ref 65–99)

## 2014-11-22 LAB — BRAIN NATRIURETIC PEPTIDE: B NATRIURETIC PEPTIDE 5: 3115.6 pg/mL — AB (ref 0.0–100.0)

## 2014-11-22 MED ORDER — METOLAZONE 5 MG PO TABS
2.5000 mg | ORAL_TABLET | Freq: Every day | ORAL | Status: DC
Start: 2014-11-16 — End: 2014-11-26
  Administered 2014-11-23 – 2014-11-26 (×4): 2.5 mg via ORAL
  Filled 2014-11-22 (×5): qty 1

## 2014-11-22 MED ORDER — ISOSORBIDE MONONITRATE ER 60 MG PO TB24
60.0000 mg | ORAL_TABLET | Freq: Two times a day (BID) | ORAL | Status: DC
  Administered 2014-11-22 – 2014-11-27 (×10): 60 mg via ORAL
  Filled 2014-11-22 (×10): qty 1

## 2014-11-22 MED ORDER — LEVOTHYROXINE SODIUM 25 MCG PO TABS
25.0000 ug | ORAL_TABLET | Freq: Every day | ORAL | Status: DC
  Administered 2014-11-23: 25 ug via ORAL
  Filled 2014-11-22: qty 1

## 2014-11-22 MED ORDER — FUROSEMIDE 80 MG PO TABS
80.0000 mg | ORAL_TABLET | Freq: Two times a day (BID) | ORAL | Status: DC
  Administered 2014-11-23: 80 mg via ORAL
  Filled 2014-11-22: qty 1

## 2014-11-22 MED ORDER — SODIUM CHLORIDE 0.9 % IJ SOLN
3.0000 mL | Freq: Two times a day (BID) | INTRAMUSCULAR | Status: DC
  Administered 2014-11-22 – 2014-11-24 (×3): 3 mL via INTRAVENOUS

## 2014-11-22 MED ORDER — DOXAZOSIN MESYLATE 1 MG PO TABS
1.0000 mg | ORAL_TABLET | Freq: Every day | ORAL | Status: DC
  Administered 2014-11-22 – 2014-11-26 (×4): 1 mg via ORAL
  Filled 2014-11-22 (×7): qty 1

## 2014-11-22 MED ORDER — SODIUM CHLORIDE 0.9 % IV SOLN: 250.0000 mL | INTRAVENOUS | Status: DC | PRN

## 2014-11-22 MED ORDER — CARVEDILOL 25 MG PO TABS
25.0000 mg | ORAL_TABLET | Freq: Two times a day (BID) | ORAL | Status: DC
  Administered 2014-11-23: 25 mg via ORAL
  Filled 2014-11-22 (×2): qty 1

## 2014-11-22 MED ORDER — SODIUM CHLORIDE 0.9 % IJ SOLN: 3.0000 mL | INTRAMUSCULAR | Status: DC | PRN

## 2014-11-22 MED ORDER — FUROSEMIDE 10 MG/ML IJ SOLN
80.0000 mg | Freq: Once | INTRAMUSCULAR | Status: AC
  Administered 2014-11-22: 80 mg via INTRAVENOUS
  Filled 2014-11-22: qty 8

## 2014-11-22 MED ORDER — NITROGLYCERIN 0.4 MG SL SUBL: 0.4000 mg | SUBLINGUAL_TABLET | SUBLINGUAL | Status: DC | PRN

## 2014-11-22 MED ORDER — WARFARIN - PHYSICIAN DOSING INPATIENT
Freq: Every day | Status: DC
  Administered 2014-11-25: 18:00:00

## 2014-11-22 MED ORDER — ASPIRIN 81 MG PO CHEW
81.0000 mg | CHEWABLE_TABLET | Freq: Every day | ORAL | Status: DC
  Administered 2014-11-22 – 2014-11-27 (×6): 81 mg via ORAL
  Filled 2014-11-22 (×6): qty 1

## 2014-11-22 MED ORDER — HYDRALAZINE HCL 50 MG PO TABS
100.0000 mg | ORAL_TABLET | Freq: Three times a day (TID) | ORAL | Status: DC
  Administered 2014-11-22 – 2014-11-27 (×15): 100 mg via ORAL
  Filled 2014-11-22 (×15): qty 2

## 2014-11-22 MED ORDER — AMIODARONE HCL 200 MG PO TABS
200.0000 mg | ORAL_TABLET | Freq: Every day | ORAL | Status: DC
  Administered 2014-11-22 – 2014-11-27 (×6): 200 mg via ORAL
  Filled 2014-11-22 (×6): qty 1

## 2014-11-22 MED ORDER — ACETAMINOPHEN 325 MG PO TABS: 650.0000 mg | ORAL_TABLET | ORAL | Status: DC | PRN

## 2014-11-22 MED ORDER — WARFARIN SODIUM 5 MG PO TABS
5.0000 mg | ORAL_TABLET | Freq: Every day | ORAL | Status: DC
  Administered 2014-11-22 – 2014-11-27 (×6): 5 mg via ORAL
  Filled 2014-11-22 (×6): qty 1

## 2014-11-22 MED ORDER — METOLAZONE 2.5 MG PO TABS: ORAL_TABLET | ORAL | Status: DC

## 2014-11-22 MED ORDER — ONDANSETRON HCL 4 MG/2ML IJ SOLN: 4.0000 mg | Freq: Four times a day (QID) | INTRAMUSCULAR | Status: DC | PRN

## 2014-11-22 NOTE — Telephone Encounter (Signed)
I was away yesterday; can give zaroxolyn 2.5 mg this am prior to am lasix dose;  For 2 - 3 days; f/u ov

## 2014-11-22 NOTE — ED Provider Notes (Signed)
CSN: [id number]     Arrival date & time 11/22/14  1407 History   First MD Initiated Contact with Patient 11/22/14 1733     Chief Complaint  Patient presents with  . Weakness   HPI Pt has not been feeling well for the last few days.  He has been feeling short of breath.  He has been coughing.  Dry cough.  He has not been sleeping well because he cannot lay flat.  His breathing feels worse when that happens.    No fevers.  No chills.  No chest pain.    He has had generalized weakness.  He had difficulty even making coffee this morning. Past Medical History  Diagnosis Date  . CAD (coronary artery disease)     myoview 03/02/12-EF 32%, mod fixed defect in the inf wall without significant ischemia; a. s/p BMS to mLAD 2000;  b. LHC 3/12:  sig ISR mLAD, RI 80-90%, Dx1 90% (too small for PCI);  PCI:  ION DES to mLAD, ION DES to RI  . HTN (hypertension)   . Hypercholesteremia   . CVA (cerebral infarction)   . CRI (chronic renal insufficiency)     Baseline creatinine of 1.4 to 1.7  . 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 Atrial fibrillation (HCC) 11/13/2011    coumadin and amiodarone; s/p DCCV November 2013; repeat DCCV Feb 13, 2012  . Left atrial thrombus (HCC)     TEE 11/2011  . Iron deficiency anemia   . Cardiomyopathy, ischemic     echo 02/28/12-Ef 25%, mod mr, mild-mod TR, mod pul htn; refuses ICD  . CHF (congestive heart failure) [facility])    Past Surgical History  Procedure Laterality Date  . Coronary angioplasty with stent placement  2000    BMS to the LAD  . Mastoidectomy    . Coronary angioplasty with stent placement  March 2012    DES to LAD and Ramus intermdius  . Tee without cardioversion  12/06/2011    Procedure: TRANSESOPHAGEAL ECHOCARDIOGRAM (TEE);  Surgeon: Pricilla Riffle, MD;  Location: [facility] ENDOSCOPY;  Service: Cardiovascular;  Laterality: N/A;  . Cardioversion  01/06/2012    Procedure: CARDIOVERSION;  Surgeon: Wendall Stade, MD;  Location: [facility] ENDOSCOPY;   Service: Cardiovascular;  Laterality: N/A;  call anesthesia  maybe able to do cardio. early at 12:00  . Cardioversion  02/13/2012    Procedure: CARDIOVERSION;  Surgeon: Cassell Clement, MD;  Location: [facility] ENDOSCOPY;  Service: Cardiovascular;  Laterality: N/A;  . Tonsillectomy    . Cholecystectomy  1999  . Left inguinal hernia repair  02/2007  . Hernia repair    . Coronary angioplasty with stent placement  04/2000    DES to LAD and ramus intermediate vessel   Family History  Problem Relation Age of Onset  . Heart disease Father   . Heart failure Neg Hx    Social History  Substance Use Topics  . Smoking status: Former Smoker    Types: Cigarettes    Quit date: 02/11/1974  . Smokeless tobacco: None  . Alcohol Use: No    Review of Systems  Constitutional: Negative for fever.  Gastrointestinal: Negative for vomiting, diarrhea and constipation.  Genitourinary: Negative for dysuria.  Neurological: Positive for light-headedness. Negative for headaches.  Psychiatric/Behavioral: Negative for confusion.  All other systems reviewed and are negative.     Allergies  Amlodipine; Ace inhibitors; and Clonidine derivatives  Home Medications   Prior to Admission medications   Medication Sig  Start Date End Date Taking? Authorizing Provider  amiodarone (PACERONE) 200 MG tablet Take 1 tablet (200 mg total) by mouth daily. 10/31/14  Yes Catarina Hartshorn, MD  aspirin 81 MG chewable tablet Chew 1 tablet (81 mg total) by mouth daily. 03/03/12  Yes Simonne Martinet, NP  carvedilol (COREG) 25 MG tablet Take 25 mg by mouth 2 (two) times daily with a meal.   Yes Historical Provider, MD  doxazosin (CARDURA) 1 MG tablet Take 1 tablet (1 mg total) by mouth at bedtime. 01/04/14  Yes Lennette Bihari, MD  furosemide (LASIX) 40 MG tablet Take 2 tablets ( ) by mouth in the morning and 1 tablet in the afternoon. Patient taking differently: Take 20 mg by mouth 3 (three) times daily. Take 2 tablets ( ) by mouth in the  morning and 1 tablet in the afternoon. 11/21/14  Yes Lennette Bihari, MD  hydrALAZINE (APRESOLINE) 50 MG tablet Take 100 mg by mouth 3 (three) times daily.  07/31/14  Yes Historical Provider, MD  isosorbide mononitrate (IMDUR) 60 MG 24 hr tablet Take 60 mg by mouth 2 (two) times daily.   Yes Historical Provider, MD  ketorolac (ACULAR) 0.5 % ophthalmic solution Place 1 drop into the left eye 4 (four) times daily as needed. For dry eyes 08/10/14  Yes Historical Provider, MD  levothyroxine (SYNTHROID, LEVOTHROID) 25 MCG tablet Take 1 tablet (25 mcg total) by mouth daily before breakfast. 10/31/14  Yes Catarina Hartshorn, MD  metolazone (ZAROXOLYN) 2.5 MG tablet Take one tablet by mouth 30 min prior to lasix x 2-3 days Patient taking differently: Take 2.5 mg by mouth. Take one tablet by mouth 30 min prior to lasix x 2-3 days 11/22/14  Yes Lennette Bihari, MD  nitroGLYCERIN (NITROSTAT) 0.4 MG SL tablet Place 1 tablet (0.4 mg total) under the tongue every 5 (five) minutes x 3 doses as needed. For chest pain 08/12/12  Yes Lennette Bihari, MD  potassium chloride SA (K-DUR,KLOR-CON) 20 MEQ tablet Take 1 tablet (20 mEq total) by mouth daily. 04/12/14  Yes Lennette Bihari, MD  warfarin (COUMADIN) 5 MG tablet Take 5 mg by mouth daily.   Yes Historical Provider, MD  digoxin (LANOXIN) 0.125 MG tablet Take 0.5 tablets (0.0625 mg total) by mouth daily. Patient not taking: Reported on 11/22/2014 09/15/14   Lennette Bihari, MD  furosemide (LASIX) 80 MG tablet Take 1 tablet (80 mg total) by mouth daily. Patient not taking: Reported on 11/22/2014 10/31/14   Catarina Hartshorn, MD  rosuvastatin (CRESTOR) 20 MG tablet Take 1 tablet (20 mg total) by mouth daily. Patient not taking: Reported on 11/22/2014 02/02/14   Lennette Bihari, MD  warfarin (COUMADIN) 5 MG tablet TAKE 1 TABLET DAILY OR AS DIRECTED. Patient not taking: Reported on 11/22/2014 09/13/14   Lennette Bihari, MD   BP 138/91 mmHg  Pulse 84  Resp 19  Ht  (1.778 m)  Wt 148 lb 13 oz  (67.5 kg)  BMI 21.35 kg/m2  SpO2 97% Physical Exam  Constitutional: No distress.  HENT:  Head: Normocephalic and atraumatic.  Right Ear: External ear normal.  Left Ear: External ear normal.  Eyes: Conjunctivae are normal. Right eye exhibits no discharge. Left eye exhibits no discharge. No scleral icterus.  Neck: Neck supple. No tracheal deviation present.  Cardiovascular: Normal rate, regular rhythm and intact distal pulses.   Pulmonary/Chest: Effort normal and breath sounds normal. No stridor. No respiratory distress. He has no wheezes. He has no  rales.  A few crackles at the bases  Abdominal: Soft. Bowel sounds are normal. He exhibits no distension. There is no tenderness. There is no rebound and no guarding.  Musculoskeletal: He exhibits no edema or tenderness.  Neurological: He is alert. No cranial nerve deficit (no facial droop, extraocular movements intact, no slurred speech) or sensory deficit. He exhibits normal muscle tone. He displays no seizure activity. Coordination normal.  Otherwise weakness, patient has difficulty sitting up in bed or lifting his legs  Skin: Skin is warm and dry. No rash noted.  Psychiatric: He has a normal mood and affect.  Nursing note and vitals reviewed.   ED Course  Procedures (including critical care time) Labs Review Labs Reviewed  BASIC METABOLIC PANEL - Abnormal; Notable for the following:    Potassium 5.4 (*)    Glucose, Bld 123 (*)    BUN 46 (*)    Creatinine, Ser 2.27 (*)    GFR calc non Af Amer 28 (*)    GFR calc Af Amer 32 (*)    All other components within normal limits  CBC - Abnormal; Notable for the following:    RBC 4.13 (*)    Hemoglobin 10.2 (*)    HCT 33.8 (*)    MCH 24.7 (*)    RDW 17.7 (*)    All other components within normal limits  APTT - Abnormal; Notable for the following:    aPTT 39 (*)    All other components within normal limits  PROTIME-INR - Abnormal; Notable for the following:    Prothrombin Time 24.8 (*)     INR 2.27 (*)    All other components within normal limits  BRAIN NATRIURETIC PEPTIDE - Abnormal; Notable for the following:    B Natriuretic Peptide 3115.6 (*)    All other components within normal limits  URINALYSIS, ROUTINE W REFLEX MICROSCOPIC (NOT AT [facility])  I-STAT TROPOININ, ED  CBG MONITORING, ED    Imaging Review Dg Chest 2 View  11/22/2014   CLINICAL DATA:  Weakness and shortness of Breath  EXAM: CHEST - 2 VIEW  COMPARISON:  10/21/2014  FINDINGS: Cardiac shadow is enlarged in size. Mild central vascular congestion is seen. Patient rotation to the right is noted accentuating the mediastinal markings. Mild interstitial changes are seen consistent with edema. No focal infiltrate is noted.  IMPRESSION: Mild CHF with interstitial edema.   Electronically Signed   By: Alcide Clever M.D.   On: 11/22/2014 18:45   I have personally reviewed and evaluated these images and lab results as part of my medical decision-making.   EKG Interpretation   Date/Time:  Tuesday November 22 2014 14:29:05 EDT Ventricular Rate:  84 PR Interval:    QRS Duration: 156 QT Interval:  438 QTC Calculation: 517 R Axis:   -51 Text Interpretation:  Atrial fibrillation with premature ventricular or  aberrantly conducted complexes Left axis deviation Left bundle branch  block Abnormal ECG No significant change since last tracing Confirmed by  FLOYD MD, Reuel Boom ([id number]) on 11/22/2014 2:34:35 PM      MDM   Final diagnoses:  Acute on chronic congestive heart failure, unspecified congestive heart failure type (HCC)    Patient's chest x-ray shows mild interstitial edema. He does have an increase in his BNP.  I suspect his progressive weakness and breathing issues is related tocongestive heart failure. I ordered a dose of Lasix. Plan on admission for further treatment considering his generalized debilitated state.  Linwood Dibbles, MD 11/24/14 2231

## 2014-11-22 NOTE — Telephone Encounter (Signed)
Left message for pt to call.

## 2014-11-22 NOTE — Telephone Encounter (Signed)
Kim called in stating that she would like for the pt to be seen as soon as possible because he is having some extreme SOB and she says that he isn't sleeping well or able to move around successfully without having to stop in short distances. Please call  Thanks

## 2014-11-22 NOTE — ED Notes (Signed)
Attempted to call report to floor 

## 2014-11-22 NOTE — Telephone Encounter (Signed)
Spoke with patient's daughter - called in via another telephone encounter. Informed her that MD advise metolazone 2.5mg  2-3 days prior to AM dose of lasix and an OV. Patient will be scheduled 10/13 with Vernona Rieger, NP @ 330pm. Daughter is aware of appointment date/time/location. Medication sent to [facility].

## 2014-11-22 NOTE — ED Notes (Addendum)
Erik Adams - Daughter [phone] Erik Adams - [phone]

## 2014-11-22 NOTE — Telephone Encounter (Signed)
Spoke with patient's daughter. Advised her of MD advice regarding metolazone - documented in another note. Patient scheduled for 10/13 OV with L.Annie Paras, NP

## 2014-11-22 NOTE — ED Notes (Signed)
CBG- 99 

## 2014-11-22 NOTE — ED Notes (Signed)
Pt from home for eval of weakness x2 days, denies any cp or sob at this time but son reports hx of heart failure and fluid around heart and states pt "gets like this" when he gets fluid build up. Pt able to answer questions, pt obviously weak, unable to stand to get weight at this time. Alert and oriented. Denies any n/v/d or fevers.

## 2014-11-22 NOTE — ED Notes (Signed)
Unable to get temp due to drinking something cold

## 2014-11-22 NOTE — Progress Notes (Signed)
Unit CM UR Completed by [facility] ED CM  W. Vangie Henthorn RN  

## 2014-11-22 NOTE — H&P (Signed)
History and Physical  Erik Adams SRP:[id number] DOB: Nov 04, 1945 DOA: 11/22/2014  PCP: No PCP Per Patient   Chief Complaint: Dyspnea   History of Present Illness:  Patient is a 69 year old male with history of CAD s/p BMS in LAD in 2000 and DES in 2012, diastolic HF with recent of 15% ( refused ICD), afib, CKD III/IV, left atrial thrombus on [facility] with coumadin who came with cc of dyspnea, orthopnea, PND and mild LE edema. Symptoms prevented him from sleeping. No other complaints. Denies chest pain, diet or medication non compliance or palpitation. From recent cardio note in Sep, it seems lasix was increased to 80 mg PO BID but it's reported he takes 20 mg TID. ROS pos for cough productive of clear sputum.  Review of Systems:  CONSTITUTIONAL:  No night sweats.  +fatigue, malaise, lethargy.  No fever or chills. Eyes:  No visual changes.  No eye pain.  No eye discharge.   ENT:    No epistaxis.  No sinus pain.  No sore throat.  No ear pain.  No congestion. RESPIRATORY:  +cough.  No wheeze.  No hemoptysis.  +shortness of breath. CARDIOVASCULAR:  No chest pains.  No palpitations. GASTROINTESTINAL:  No abdominal pain.  No nausea or vomiting.  No diarrhea or constipation.  No hematemesis.  No hematochezia.  No melena. GENITOURINARY:  No urgency.  No frequency.  No dysuria.  No hematuria.  No obstructive symptoms.  No discharge.  No pain.  No significant abnormal bleeding. MUSCULOSKELETAL:  No musculoskeletal pain.  No joint swelling.  No arthritis. NEUROLOGICAL:  No confusion.  No weakness. No headache. No seizure. PSYCHIATRIC:  No depression. No anxiety. No suicidal ideation. SKIN:  No rashes.  No lesions.  No wounds. ENDOCRINE:  No unexplained weight loss.  No polydipsia.  No polyuria.  No polyphagia. HEMATOLOGIC:  No anemia.  No purpura.  No petechiae.  No bleeding.  ALLERGIC AND IMMUNOLOGIC:  No pruritus.  No swelling Other:  Past Medical and Surgical History:   Past Medical  History  Diagnosis Date  . CAD (coronary artery disease)     myoview 03/02/12-EF 32%, mod fixed defect in the inf wall without significant ischemia; a. s/p BMS to mLAD 2000;  b. LHC 3/12:  sig ISR mLAD, RI 80-90%, Dx1 90% (too small for PCI);  PCI:  ION DES to mLAD, ION DES to RI  . HTN (hypertension)   . Hypercholesteremia   . CVA (cerebral infarction)   . CRI (chronic renal insufficiency)     Baseline creatinine of 1.4 to 1.7  . 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 Atrial fibrillation (HCC) 11/13/2011    coumadin and amiodarone; s/p DCCV November 2013; repeat DCCV Feb 13, 2012  . Left atrial thrombus (HCC)     TEE 11/2011  . Iron deficiency anemia   . Cardiomyopathy, ischemic     echo 02/28/12-Ef 25%, mod mr, mild-mod TR, mod pul htn; refuses ICD  . CHF (congestive heart failure) [facility])    Past Surgical History  Procedure Laterality Date  . Coronary angioplasty with stent placement  2000    BMS to the LAD  . Mastoidectomy    . Coronary angioplasty with stent placement  March 2012    DES to LAD and Ramus intermdius  . Tee without cardioversion  12/06/2011    Procedure: TRANSESOPHAGEAL ECHOCARDIOGRAM (TEE);  Surgeon: Pricilla Riffle, MD;  Location: [facility] ENDOSCOPY;  Service: Cardiovascular;  Laterality: N/A;  .  Cardioversion  01/06/2012    Procedure: CARDIOVERSION;  Surgeon: Wendall Stade, MD;  Location: [facility] ENDOSCOPY;  Service: Cardiovascular;  Laterality: N/A;  call anesthesia  maybe able to do cardio. early at 12:00  . Cardioversion  02/13/2012    Procedure: CARDIOVERSION;  Surgeon: Cassell Clement, MD;  Location: [facility] ENDOSCOPY;  Service: Cardiovascular;  Laterality: N/A;  . Tonsillectomy    . Cholecystectomy  1999  . Left inguinal hernia repair  02/2007  . Hernia repair    . Coronary angioplasty with stent placement  04/2000    DES to LAD and ramus intermediate vessel    Social History:   reports that he quit smoking about 40 years ago. His smoking use included  Cigarettes. He does not have any smokeless tobacco history on file. He reports that he does not drink alcohol or use illicit drugs.   Allergies  Allergen Reactions  . Amlodipine Shortness Of Breath, Swelling and Palpitations    "Caused  Heart condition-per patient"  . Ace Inhibitors Other (See Comments)    Unknown reaction  . Clonidine Derivatives Other (See Comments)    Unknown reaction    Family History  Problem Relation Age of Onset  . Heart disease Father   . Heart failure Neg Hx       Prior to Admission medications   Medication Sig Start Date End Date Taking? Authorizing Provider  amiodarone (PACERONE) 200 MG tablet Take 1 tablet (200 mg total) by mouth daily. 10/31/14  Yes Catarina Hartshorn, MD  aspirin 81 MG chewable tablet Chew 1 tablet (81 mg total) by mouth daily. 03/03/12  Yes Simonne Martinet, NP  carvedilol (COREG) 25 MG tablet Take 25 mg by mouth 2 (two) times daily with a meal.   Yes Historical Provider, MD  doxazosin (CARDURA) 1 MG tablet Take 1 tablet (1 mg total) by mouth at bedtime. 01/04/14  Yes Lennette Bihari, MD  furosemide (LASIX) 40 MG tablet Take 2 tablets ( ) by mouth in the morning and 1 tablet in the afternoon. Patient taking differently: Take 20 mg by mouth 3 (three) times daily. Take 2 tablets ( ) by mouth in the morning and 1 tablet in the afternoon. 11/21/14  Yes Lennette Bihari, MD  hydrALAZINE (APRESOLINE) 50 MG tablet Take 100 mg by mouth 3 (three) times daily.  07/31/14  Yes Historical Provider, MD  isosorbide mononitrate (IMDUR) 60 MG 24 hr tablet Take 60 mg by mouth 2 (two) times daily.   Yes Historical Provider, MD  ketorolac (ACULAR) 0.5 % ophthalmic solution Place 1 drop into the left eye 4 (four) times daily as needed. For dry eyes 08/10/14  Yes Historical Provider, MD  levothyroxine (SYNTHROID, LEVOTHROID) 25 MCG tablet Take 1 tablet (25 mcg total) by mouth daily before breakfast. 10/31/14  Yes Catarina Hartshorn, MD  metolazone (ZAROXOLYN) 2.5 MG tablet Take  one tablet by mouth 30 min prior to lasix x 2-3 days Patient taking differently: Take 2.5 mg by mouth. Take one tablet by mouth 30 min prior to lasix x 2-3 days 11/22/14  Yes Lennette Bihari, MD  nitroGLYCERIN (NITROSTAT) 0.4 MG SL tablet Place 1 tablet (0.4 mg total) under the tongue every 5 (five) minutes x 3 doses as needed. For chest pain 08/12/12  Yes Lennette Bihari, MD  potassium chloride SA (K-DUR,KLOR-CON) 20 MEQ tablet Take 1 tablet (20 mEq total) by mouth daily. 04/12/14  Yes Lennette Bihari, MD  warfarin (COUMADIN) 5 MG tablet Take 5 mg by  mouth daily.   Yes Historical Provider, MD  digoxin (LANOXIN) 0.125 MG tablet Take 0.5 tablets (0.0625 mg total) by mouth daily. Patient not taking: Reported on 11/22/2014 09/15/14   Lennette Bihari, MD  furosemide (LASIX) 80 MG tablet Take 1 tablet (80 mg total) by mouth daily. Patient not taking: Reported on 11/22/2014 10/31/14   Catarina Hartshorn, MD  rosuvastatin (CRESTOR) 20 MG tablet Take 1 tablet (20 mg total) by mouth daily. Patient not taking: Reported on 11/22/2014 02/02/14   Lennette Bihari, MD  warfarin (COUMADIN) 5 MG tablet TAKE 1 TABLET DAILY OR AS DIRECTED. Patient not taking: Reported on 11/22/2014 09/13/14   Lennette Bihari, MD    Physical Exam: BP 134/93 mmHg  Pulse 99  Temp(Src) 97.7 F (36.5 C) (Oral)  Resp 18  Ht  (1.778 m)  Wt 67.5 kg (148 lb 13 oz)  BMI 21.35 kg/m2  SpO2 99%  GENERAL : Well developed, well nourished, alert and cooperative, and appears to be in no acute distress. HEAD: normocephalic. EYES: PERRL, EOMI. EARS:  hearing grossly intact. NOSE: No nasal discharge. THROAT: Oral cavity and pharynx normal. No inflammation, swelling, exudate, or lesions.  NECK: Neck supple, non-tender. CARDIAC: Normal S1 and S2. No S3, S4 or murmurs. Rhythm is irregular. There is no peripheral edema, cyanosis or pallor. + gallop. LUNGS:mild wet crackles in lower lung fields bilaterally.  ABDOMEN: Positive bowel sounds. Soft, nondistended,  nontender. No guarding or rebound. No masses.Marland Kitchen EXTREMITIES: No significant deformity or joint abnormality. No edema.No varicosities. NEUROLOGICAL: The mental examination revealed the patient was oriented to person, place, and time.CN II-XII intact. Strength and sensation symmetric and intact throughout. SKIN: Skin normal color, texture and turgor with no lesions or eruptions. PSYCHIATRIC:  The patient was able to demonstrate good judgement and reason, without hallucinations, abnormal affect or abnormal behaviors during the examination. Patient is not suicidal.          Labs on Admission:  Reviewed.   Radiological Exams on Admission: Dg Chest 2 View  11/22/2014   CLINICAL DATA:  Weakness and shortness of Breath  EXAM: CHEST - 2 VIEW  COMPARISON:  10/21/2014  FINDINGS: Cardiac shadow is enlarged in size. Mild central vascular congestion is seen. Patient rotation to the right is noted accentuating the mediastinal markings. Mild interstitial changes are seen consistent with edema. No focal infiltrate is noted.  IMPRESSION: Mild CHF with interstitial edema.   Electronically Signed   By: Alcide Clever M.D.   On: 11/22/2014 18:45    EKG:  Independently reviewed : LBBB ( old)  Assessment/Plan  Diastolic heart failure : acute on chronic Due to medication non compliance ( most likely) Will r/o ACS / trend trops and EKGs with initial unremarkable results Continue BB/ACEI and hydralazine/Imdur Given lasix 80 mg IV in the ER, will continue 80 mg BID, PO.  CAD s/p stents in 2010/2012: no chest pain, continue asp and BB, reports not taking statin. Admit to tele  Hypothyroidism: continue synthroid  Afib: on coumadin, INR therapeutic  CKD: Cr at baseline  Hyperkalemia: given 80 lasix IV, will check in am.   Input & Output: monitor  DVT prophylaxis: on AC full dose Code Status: Full    Eston Esters M.D [facility]

## 2014-11-22 NOTE — ED Notes (Signed)
Pt transported to radiology.

## 2014-11-23 ENCOUNTER — Telehealth: Payer: Self-pay | Admitting: *Deleted

## 2014-11-23 DIAGNOSIS — E0590 Thyrotoxicosis, unspecified without thyrotoxic crisis or storm: Secondary | ICD-10-CM

## 2014-11-23 DIAGNOSIS — I5041 Acute combined systolic (congestive) and diastolic (congestive) heart failure: Secondary | ICD-10-CM

## 2014-11-23 DIAGNOSIS — R0609 Other forms of dyspnea: Secondary | ICD-10-CM

## 2014-11-23 DIAGNOSIS — Z79899 Other long term (current) drug therapy: Secondary | ICD-10-CM

## 2014-11-23 DIAGNOSIS — R609 Edema, unspecified: Secondary | ICD-10-CM

## 2014-11-23 LAB — BASIC METABOLIC PANEL
Anion gap: 13 (ref 5–15)
BUN: 42 mg/dL — AB (ref 6–20)
CALCIUM: 8.8 mg/dL — AB (ref 8.9–10.3)
CO2: 25 mmol/L (ref 22–32)
Chloride: 96 mmol/L — ABNORMAL LOW (ref 101–111)
Creatinine, Ser: 2.2 mg/dL — ABNORMAL HIGH (ref 0.61–1.24)
GFR calc Af Amer: 33 mL/min — ABNORMAL LOW (ref >60)
GFR, EST NON AFRICAN AMERICAN: 29 mL/min — AB (ref >60)
GLUCOSE: 86 mg/dL (ref 65–99)
Potassium: 3.5 mmol/L (ref 3.5–5.1)
Sodium: 134 mmol/L — ABNORMAL LOW (ref 135–145)

## 2014-11-23 LAB — TROPONIN I
TROPONIN I: 0.16 ng/mL — AB (ref <0.031)
Troponin I: 0.14 ng/mL — ABNORMAL HIGH (ref <0.031)
Troponin I: 0.19 ng/mL — ABNORMAL HIGH (ref <0.031)
Troponin I: 0.18 ng/mL — ABNORMAL HIGH (ref <0.031)

## 2014-11-23 MED ORDER — CARVEDILOL 12.5 MG PO TABS
12.5000 mg | ORAL_TABLET | Freq: Two times a day (BID) | ORAL | Status: DC
  Administered 2014-11-23 – 2014-11-27 (×9): 12.5 mg via ORAL
  Filled 2014-11-23 (×9): qty 1

## 2014-11-23 MED ORDER — FUROSEMIDE 10 MG/ML IJ SOLN
80.0000 mg | Freq: Two times a day (BID) | INTRAMUSCULAR | Status: DC
  Administered 2014-11-23 – 2014-11-24 (×3): 80 mg via INTRAVENOUS
  Filled 2014-11-23 (×3): qty 8

## 2014-11-23 NOTE — ED Notes (Signed)
Patient sustained two minor skin tears by placing his legs through the raised bedside rail to sit on the side of the bed while using urinal.  Patient had been instructed to use call bell that was provided for assistance when using the restroom.  Skin tear was dressed with 4x4 and secured via paper tape.

## 2014-11-23 NOTE — Progress Notes (Addendum)
Erik Adams:[id number] DOB: 13-Sep-1945 DOA: 11/22/2014 PCP: No PCP Per Patient  Brief narrative:  69 y/o ? 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 fib status post cardioversion 2013/2014 + left atrial thrombus Hypothyroidism Chronic elevated troponin Prior strokes on aspirin   Admitted with likely exacerbation CHF, Hyperkalemia It appears since 07/2014 he has had a gradual edecline His medications -lasix and metolazone were being adjusted as an OP by Cardiology--NOte patient has h/o non-compliance on meds and refused to see PA for follow up but only wanted to see Dr. Tresa Endo [I am forwarding to Dr. Tresa Endo    Past medical history-As per Problem list Chart reviewed as below-   Consultants:  non  Procedures:  none  Antibiotics:  non   Subjective   Feels a little better.  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 Now his baseline on a good day is can sometimes ge tto kitchen to do for himself One son lives c him No f/chills/sputum/rash tol diet    Objective    Interim History:   Telemetry:    Objective: Filed Vitals:   11/22/14 2212 11/22/14 2300 11/23/14 0433 11/23/14 1045  BP: 129/89 136/99 133/84 111/75  Pulse: 91  96   Temp:  97.6 F (36.4 C) 99.3 F (37.4 C)   TempSrc:  Oral Oral   Resp: Height:      Weight:  65.091 kg (143 lb 8 oz) 65.182 kg (143 lb 11.2 oz)   SpO2: 93% 95% 92%     Intake/Output Summary (Last 24 hours) at 11/23/14 1220 Last  data filed at 11/23/14 1048  Gross per 24 hour  Intake    243 ml  Output    220 ml  Net     23 ml    Exam:  General: eomi ncat Cardiovascular: no jvd no bruit Respiratory: decreased lung sounds posteriorly  Abdomen: soft nt nd  Skin no LE edema Neuro intact no focal deficit  Data Reviewed: Basic Metabolic Panel:  Recent Labs Lab 11/22/14 1434 11/23/14 0355  NA 136 134*  K 5.4* 3.5  CL 102 96*  CO2 23 25  GLUCOSE 123* 86  BUN 46* 42*  CREATININE 2.27* 2.20*  CALCIUM 9.3 8.8*   Liver Function Tests: No results for input(s): AST, ALT, ALKPHOS, BILITOT, PROT, ALBUMIN in the last 168 hours. No results for input(s): LIPASE, AMYLASE in the last 168 hours. No results for input(s): AMMONIA in the last 168 hours. CBC:  Recent Labs Lab 11/22/14 1434  WBC 9.4  HGB 10.2*  HCT 33.8*  MCV 81.8  PLT 284   Cardiac Enzymes:  Recent Labs Lab 11/23/14 0355 11/23/14 1002  TROPONINI 0.14* 0.19*   BNP: Invalid input(s): POCBNP CBG:  Recent Labs Lab 11/22/14 2055  GLUCAP 99    No results found for this or any previous visit (from the past 240 hour(s)).   Studies:              All  Imaging reviewed and is as per above notation   Scheduled Meds: . amiodarone  200 mg Oral Daily  . aspirin  81 mg Oral Daily  . carvedilol  25 mg Oral BID WC  . doxazosin  1 mg Oral QHS  . furosemide  80 mg Oral BID  . hydrALAZINE  100 mg Oral TID  . isosorbide mononitrate  60 mg Oral BID  . levothyroxine  25 mcg Oral QAC breakfast  . metolazone  2.5 mg Oral Daily  . sodium chloride  3 mL Intravenous Q12H  . warfarin  5 mg Oral q1800  . Warfarin - Physician Dosing Inpatient   Does not apply q1800   Continuous Infusions:    Assessment/Plan:  1. Acute exac both sys/diastol HF-Change po lasix 80 -->IV bid.  Fluid restrict/stict I/o [refuse catheter].  Cont Metolazone 2.5.  ? coreg 25-12.5 2/2 AECHF.  Long discussion with family bedside and with patient and patient not interested  in Hospice nor PPM 2. Isch CM c CAD h/o and Cardiac cachexia-Patient hospice eligible.  We opened discussion today about this.  Cont. imdur 60 bid, hydralazine 100 tid etc.  Elevated troponin likely 2/2 to Demand ischemia.  No further w/u as per patient as not wishing the same 3. Afib, Parox chad2vasc2~4-/ALso LAD thrombus-continue Amiodarone 200 daily.  INR therapeutic 2.27-phamracy dose couamdin 4. R Renal Art stenosis + CKD iii-iV-Monitor creat in am as on lasix/metolazone. 5. Prior multiple CVA's-currently stable 6. Hypothyroid-TSH ~ 10/30/14 was 0.008.  Was taken off synthroid by Cardiology Dr. Tresa Endo. I have d/c this again.  Code Status:updated to DNR Family Communication: family +, Daughter who I had a long talk with Disposition Plan: home vs SNF-will ask for therapy eval DVT prophylaxis: on coumadin Consultants:   Pleas Koch, MD  [facility] Pager [phone] 11/23/2014, 12:20 PM    LOS: 1 day

## 2014-11-23 NOTE — Progress Notes (Signed)
I know patient from previous hospitalization.  He has limited understanding of his HF and I question his ability to provide self care. He lives with son--yet his son works full-time and is not there at times.  He apparently has had a significant decline recently.  He would benefit from [facility] referral for outpatient follow-up as well HHRN for ongoing education, compliance reinforcement and symptom recognition.

## 2014-11-23 NOTE — Telephone Encounter (Signed)
-----   Message from Lennette Bihari, MD sent at 11/22/2014  3:25 PM EDT ----- INC BNP c/w acute on chronic CHF; low dose zarolxyn had been orderded by phone message. TSH is oversuppressed; dc synthroid (only on .05 mg).  Re check Bmet, BNP and TSH in 2 weeks

## 2014-11-23 NOTE — Care Management Note (Signed)
Case Management Note  Patient Details  Name: DREVEN ALTERI MRN: [id number] Date of Birth: 05-14-1945  Subjective/Objective:    Pt admitted for CHF. Pt is from home with son Sam. CM did speak with pt in regards to [facility] Services. Pt feels like he will not need any HH services at this time. CM did ask if it was okay to touch base with son Sam.                 Action/Plan: CM did call Sam @ [phone]. CM received Voice Mail. CM will await return phone call. Pt may benefit from [facility] if agreeable to services. HF Liaison is following pt as well. No further needs at this time.    Expected Discharge Date:                  Expected Discharge Plan:  Home w Home Health Services  In-House Referral:  NA  Discharge planning Services  CM Consult  Post Acute Care Choice:    Choice offered to:     DME Arranged:    DME Agency:     HH Arranged:    HH Agency:     Status of Service:  In process, will continue to follow  Medicare Important Message Given:    Date Medicare IM Given:    Medicare IM give by:    Date Additional Medicare IM Given:    Additional Medicare Important Message give by:     If discussed at Long Length of Stay Meetings, dates discussed:    Additional Comments:  Gala Lewandowsky, RN 11/23/2014, 11:41 AM

## 2014-11-23 NOTE — Telephone Encounter (Signed)
Left lab results and recommendations on patient's home answering machine. Requested labs ordered.

## 2014-11-24 ENCOUNTER — Ambulatory Visit: Payer: Medicare Other | Admitting: Cardiology

## 2014-11-24 DIAGNOSIS — I5043 Acute on chronic combined systolic (congestive) and diastolic (congestive) heart failure: Secondary | ICD-10-CM

## 2014-11-24 LAB — BASIC METABOLIC PANEL
ANION GAP: 12 (ref 5–15)
BUN: 54 mg/dL — AB (ref 6–20)
CALCIUM: 9 mg/dL (ref 8.9–10.3)
CO2: 36 mmol/L — AB (ref 22–32)
CREATININE: 2.77 mg/dL — AB (ref 0.61–1.24)
Chloride: 90 mmol/L — ABNORMAL LOW (ref 101–111)
GFR calc Af Amer: 25 mL/min — ABNORMAL LOW (ref >60)
GFR, EST NON AFRICAN AMERICAN: 22 mL/min — AB (ref >60)
GLUCOSE: 104 mg/dL — AB (ref 65–99)
Potassium: 2.8 mmol/L — ABNORMAL LOW (ref 3.5–5.1)
Sodium: 138 mmol/L (ref 135–145)

## 2014-11-24 LAB — COMPREHENSIVE METABOLIC PANEL
ALBUMIN: 2.7 g/dL — AB (ref 3.5–5.0)
ALK PHOS: 66 U/L (ref 38–126)
ALT: 43 U/L (ref 17–63)
AST: 37 U/L (ref 15–41)
Anion gap: 18 — ABNORMAL HIGH (ref 5–15)
BILIRUBIN TOTAL: 1 mg/dL (ref 0.3–1.2)
BUN: 52 mg/dL — AB (ref 6–20)
CALCIUM: 8.8 mg/dL — AB (ref 8.9–10.3)
CO2: 30 mmol/L (ref 22–32)
CREATININE: 2.31 mg/dL — AB (ref 0.61–1.24)
Chloride: 88 mmol/L — ABNORMAL LOW (ref 101–111)
GFR calc Af Amer: 31 mL/min — ABNORMAL LOW (ref >60)
GFR, EST NON AFRICAN AMERICAN: 27 mL/min — AB (ref >60)
GLUCOSE: 89 mg/dL (ref 65–99)
POTASSIUM: 2.2 mmol/L — AB (ref 3.5–5.1)
Sodium: 136 mmol/L (ref 135–145)
TOTAL PROTEIN: 5.6 g/dL — AB (ref 6.5–8.1)

## 2014-11-24 LAB — PROTIME-INR
INR: 2.49 — ABNORMAL HIGH (ref 0.00–1.49)
PROTHROMBIN TIME: 26.6 s — AB (ref 11.6–15.2)

## 2014-11-24 LAB — MAGNESIUM: MAGNESIUM: 1.8 mg/dL (ref 1.7–2.4)

## 2014-11-24 MED ORDER — POTASSIUM CHLORIDE 10 MEQ/100ML IV SOLN: 10.0000 meq | INTRAVENOUS | Status: DC

## 2014-11-24 MED ORDER — POTASSIUM CHLORIDE CRYS ER 20 MEQ PO TBCR
40.0000 meq | EXTENDED_RELEASE_TABLET | Freq: Three times a day (TID) | ORAL | Status: AC
Start: 2014-11-24 — End: 2014-11-24
  Administered 2014-11-24 (×3): 40 meq via ORAL
  Filled 2014-11-24 (×3): qty 2

## 2014-11-24 MED ORDER — POTASSIUM CHLORIDE 20 MEQ/15ML (10%) PO SOLN
40.0000 meq | Freq: Once | ORAL | Status: AC
  Administered 2014-11-24: 40 meq via ORAL
  Filled 2014-11-24: qty 30

## 2014-11-24 MED ORDER — POTASSIUM CHLORIDE 20 MEQ/15ML (10%) PO SOLN: 40.0000 meq | Freq: Once | ORAL | Status: DC

## 2014-11-24 MED ORDER — SODIUM CHLORIDE 0.9 % IV SOLN
INTRAVENOUS | Status: DC
  Administered 2014-11-24 – 2014-11-25 (×2): via INTRAVENOUS
  Filled 2014-11-24 (×3): qty 1000

## 2014-11-24 MED ORDER — POTASSIUM CHLORIDE 10 MEQ/100ML IV SOLN
10.0000 meq | INTRAVENOUS | Status: AC
  Administered 2014-11-24 (×3): 10 meq via INTRAVENOUS
  Filled 2014-11-24 (×3): qty 100

## 2014-11-24 MED ORDER — FUROSEMIDE 80 MG PO TABS
80.0000 mg | ORAL_TABLET | Freq: Every day | ORAL | Status: DC
  Administered 2014-11-25 – 2014-11-27 (×3): 80 mg via ORAL
  Filled 2014-11-24 (×3): qty 1

## 2014-11-24 NOTE — Progress Notes (Signed)
Pharmacist Heart Failure Core Measure Documentation  Assessment: Erik Adams has an EF documented as 15% on 9/16 by 2D ECHO.  Rationale: Heart failure patients with left ventricular systolic dysfunction (LVSD) and an EF < 40% should be prescribed an angiotensin converting enzyme inhibitor (ACEI) or angiotensin receptor blocker (ARB) at discharge unless a contraindication is documented in the medical record.  This patient is not currently on an ACEI or ARB for HF.  This note is being placed in the record in order to provide documentation that a contraindication to the use of these agents is present for this encounter.  ACE Inhibitor or Angiotensin Receptor Blocker is contraindicated (specify all that apply)  []   ACEI allergy AND ARB allergy []   Angioedema []   Moderate or severe aortic stenosis []   Hyperkalemia []   Hypotension []   Renal artery stenosis [x]   Worsening renal function, preexisting renal disease or dysfunction   Bertram Millard 11/24/2014 11:19 AM

## 2014-11-24 NOTE — Progress Notes (Signed)
PT Cancellation Note  Patient Details Name: Erik Adams MRN: [id number] DOB: [birth year]   Cancelled Treatment:    Reason Eval/Treat Not Completed: Medical issues which prohibited therapy Holding PT evaluation as pt with elevated troponin and hypokalemic. Will await clearance from Cardiologist. Will follow up next available time.   Blake Divine A [name] 11/24/2014, 7:35 AM  Mylo Red, PT, DPT [phone]

## 2014-11-24 NOTE — Progress Notes (Addendum)
Erik Adams ZOX:[id number] DOB: 08-12-1945 DOA: 11/22/2014 PCP: No PCP Per Patient  Brief narrative:  69 y/o ? 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 fib status post cardioversion 2013/2014 + left atrial thrombus Hypothyroidism Chronic elevated troponin Prior strokes on aspirin   Admitted with likely exacerbation CHF, Hyperkalemia It appears since 07/2014 he has had a gradual edecline His medications -lasix and metolazone were being adjusted as an OP by Cardiology--Note patient has h/o non-compliance on meds and refused to see PA for follow up but only wanted to see Dr. Tresa Endo [I am forwarding to Dr. Tresa Endo    Past medical history-As per Problem list Chart reviewed as below-   Consultants:  non  Procedures:  none  Antibiotics:  none   Subjective   Fair Not confused but needed supervision as impulsive this am denies cp SOB is better  Refusing condom cath fam in room   Objective    Interim History:   Telemetry:    Objective: Filed Vitals:   11/23/14 2244 11/23/14 2300 11/24/14 0500 11/24/14 0859  BP: 131/78 140/86 127/78 117/72  Pulse: 84  82   Temp: 97.7 F (36.5 C)  98.7 F (37.1 C)   TempSrc: Oral  Oral   Resp: 18  16   Height:      Weight:   62.324 kg (137 lb 6.4 oz)   SpO2: 97%  93%     Intake/Output Summary (Last 24 hours) at 11/24/14 1123 Last data filed at 11/24/14 8119  Gross per 24 hour  Intake    780 ml  Output   2550 ml  Net  -1770 ml    Exam:  General: eomi ncat Cardiovascular: no jvd no bruit Respiratory: decreased lung sounds posteriorly  Abdomen: soft nt nd  Skin no LE edema Neuro intact no focal  deficit  Data Reviewed: Basic Metabolic Panel:  Recent Labs Lab 11/22/14 1434 11/23/14 0355 11/24/14 0345  NA 136 134* 136  K 5.4* 3.5 2.2*  CL 102 96* 88*  CO2 GLUCOSE 123* 86 89  BUN 46* 42* 52*  CREATININE 2.27* 2.20* 2.31*  CALCIUM 9.3 8.8* 8.8*  MG  --   --  1.8   Liver Function Tests:  Recent Labs Lab 11/24/14 0345  AST 37  ALT 43  ALKPHOS 66  BILITOT 1.0  PROT 5.6*  ALBUMIN 2.7*   No results for input(s): LIPASE, AMYLASE in the last 168 hours. No results for input(s): AMMONIA in the last 168 hours. CBC:  Recent Labs Lab 11/22/14 1434  WBC 9.4  HGB 10.2*  HCT 33.8*  MCV 81.8  PLT 284   Cardiac Enzymes:  Recent Labs Lab 11/23/14 0355 11/23/14 1002 11/23/14 1542 11/23/14 2129  TROPONINI 0.14* 0.19* 0.18* 0.16*   BNP: Invalid input(s): POCBNP CBG:  Recent Labs Lab 11/22/14 2055  GLUCAP 99    No results found for this or any previous visit (from the past 240 hour(s)).   Studies:              All Imaging reviewed and is as per above notation   Scheduled Meds: . amiodarone  200 mg Oral Daily  . aspirin  81 mg Oral Daily  . carvedilol  12.5 mg Oral BID WC  . doxazosin  1 mg Oral QHS  . furosemide  80 mg Intravenous BID  . hydrALAZINE  100 mg Oral TID  . isosorbide mononitrate  60 mg Oral BID  . metolazone  2.5 mg Oral Daily  . potassium chloride  40 mEq Oral TID  . warfarin  5 mg Oral q1800  . Warfarin - Physician Dosing Inpatient   Does not apply q1800   Continuous Infusions: . sodium chloride 0.9 % 1,000 mL with potassium chloride 40 mEq infusion       Assessment/Plan:  1. Acute exac both sys/diastol HF-.  Fluid restrict/stict I/o [place] Por[place] Fole and pulled off Condom catheter].  Cont Metolazone 2.5 mg daily.  ? coreg 25-12.5 2/2 AECHF. patient not interested in Hospice nor PPM--re-iterated family to discuss amongst themselves and him--?close OP f/u Dr. Tresa Endo who patient trsuts--weight down 6 lbs, -1.5 liters so  far 2. Isch CM c CAD h/o and Cardiac cachexia-Patient hospice eligible.  We opened discussion today about this.  Cont. imdur 60 bid, hydralazine 100 tid etc.  Elevated troponin likely 2/2 to Demand ischemia.  No further w/u as per patient as not wishing the same 3. Afib, Parox chad2vasc2~4-/ALso LAD thrombus-continue Amiodarone 200 daily.  INR therapeutic 2.49-phamracy dose coumadin 4. Hypokalemia-repalcing Po but as still in 2.2-2.8 range, give 4 runs 11/24/14 5. R Renal Art stenosis + acute kidney injury 2/2 to diureticCKD iii-iV-Monitor creat in am as on lasix/metolazone.  PO lasix 80 od as of 10/14 am.  bmet am 6. Prior multiple CVA's-currently stable 7. Hypothyroid-TSH ~ 10/30/14 was 0.008.  Was taken off synthroid by Cardiology Dr. Tresa Endo. I have d/c this again.  Code Status:updated to DNR Family Communication: family +, daughter/son-i have asked they clarify with patient how to proceed moving forward Disposition Plan: home vs SNF-will ask for therapy eval DVT prophylaxis: on coumadin Consultants:   Pleas Koch, MD  [facility] Pager [phone] 11/24/2014, 11:23 AM    LOS: 2 days

## 2014-11-24 NOTE — Progress Notes (Signed)
CRITICAL VALUE ALERT  Critical value received:  Potassium 2.2  Date of notification:  11/24/14  Time of notification:  0542  Critical value read back:Yes.    Nurse who received alert:  Ronna Polio  MD notified (1st page): Schorr, NP   Time of first page:  [phone]  MD notified (2nd page):  Time of second page:  Responding MD:  Clearence Ped, NP  Time MD responded:  [phone]

## 2014-11-24 NOTE — Progress Notes (Signed)
This RN spoke with Erik Adams to get permission to have pt sit with staff at nursing station.  Pt confused, very impulsive and no sitters available.  Currently pt doing crossword puzzle at desk

## 2014-11-24 NOTE — Care Management (Signed)
[facility] [place]:  This Case Manager case conferenced with Jacqlyn Krauss, RN CM regarding patient.  Patient listed as not having a PCP and meets criteria for the [facility] as patient has had 3 inpatient admissions in the last six months and has multiple chronic diseases (CAD, CHF, CKD, HTN).  Met with patient at bedside. Informed patient of the follow-up and medical management provided at the [facility]. Patient indicated this Case Manager could discuss the program with his daughter, Maudie Mercury, or his son, Inocente Salles.  Call placed to Maudie Mercury [phone]) to discuss; unable to reach and no voicemail available to leave message. In addition, call placed to patient's son, Inocente Salles [phone]), and informed patient's son of the [facility] and [facility]. Patient's son indicated patient has a PCP-Dr. Shelva Majestic. He declined follow-up at the [facility] and indicated patient receives good follow-up with his PCP Dr. Shelva Majestic.  Jacqlyn Krauss, RN CM updated.

## 2014-11-24 NOTE — Progress Notes (Signed)
OT Cancellation Note  Patient Details Name: Erik Adams MRN: [id number] DOB: November 19, 1945   Cancelled Treatment:    Reason Eval/Treat Not Completed: Patient not medically ready. Holding OT evaluation as pt with elevated troponin and hypokalemic. Will await clearance from Cardiologist. Will follow up as able and appropriate. Thank you for the order.   Schylar Allard , MS, OTR/L, CLT Pager: [phone]  11/24/2014, 8:15 AM

## 2014-11-24 NOTE — Progress Notes (Signed)
Pt is refusing to use chair alarm, he pulled chair alarm pad off the chair and trow it on the bed. He was educated about the need of the alarm since he is a fall risk, but he still refusing.  Colleen Can, RN

## 2014-11-25 ENCOUNTER — Encounter: Payer: Self-pay | Admitting: Family Medicine

## 2014-11-25 DIAGNOSIS — I5031 Acute diastolic (congestive) heart failure: Secondary | ICD-10-CM

## 2014-11-25 LAB — BASIC METABOLIC PANEL
Anion gap: 13 (ref 5–15)
BUN: 56 mg/dL — AB (ref 6–20)
CHLORIDE: 94 mmol/L — AB (ref 101–111)
CO2: 29 mmol/L (ref 22–32)
Calcium: 8.5 mg/dL — ABNORMAL LOW (ref 8.9–10.3)
Creatinine, Ser: 2.49 mg/dL — ABNORMAL HIGH (ref 0.61–1.24)
GFR calc Af Amer: 29 mL/min — ABNORMAL LOW (ref >60)
GFR calc non Af Amer: 25 mL/min — ABNORMAL LOW (ref >60)
Glucose, Bld: 101 mg/dL — ABNORMAL HIGH (ref 65–99)
POTASSIUM: 3.4 mmol/L — AB (ref 3.5–5.1)
SODIUM: 136 mmol/L (ref 135–145)

## 2014-11-25 LAB — PROTIME-INR
INR: 2.75 — ABNORMAL HIGH (ref 0.00–1.49)
Prothrombin Time: 28.7 seconds — ABNORMAL HIGH (ref 11.6–15.2)

## 2014-11-25 LAB — MAGNESIUM: MAGNESIUM: 2.1 mg/dL (ref 1.7–2.4)

## 2014-11-25 MED ORDER — POTASSIUM CHLORIDE 10 MEQ/100ML IV SOLN
10.0000 meq | Freq: Once | INTRAVENOUS | Status: AC
Start: 2014-11-25 — End: 2014-11-25
  Administered 2014-11-25: 10 meq via INTRAVENOUS
  Filled 2014-11-25: qty 100

## 2014-11-25 NOTE — Care Management Important Message (Signed)
Important Message  Patient Details  Name: ANTUANE KUBESH MRN: [id number] Date of Birth: [birth year]   Medicare Important Message Given:  Yes-second notification given    Kyla Balzarine 11/25/2014, 10:23 AM

## 2014-11-25 NOTE — Evaluation (Signed)
Occupational Therapy Evaluation Patient Details Name: Erik Adams MRN: [id number] DOB: 1945-12-01 Today's Date: 11/25/2014    History of Present Illness Patient is a 69 year old male with history of CAD s/p BMS in LAD in 2000 and DES in 2012, diastolic HF with recent of 15% ( refused ICD), afib, CKD III/IV, left atrial thrombus on [facility] with coumadin who came with cc of dyspnea, orthopnea, PND and mild LE edema   Clinical Impression   Pt currently needs min to mod assist for balance with simulated selfcare tasks.  Decreased safety awareness noted and memory of why he is in the hospital.  Pt's daughter-in -law present for eval and states pt's son works and they would have to figure out 24 hour supervision if discharge home from acute care.  Feel he could benefit from acute care OT to work on the listed deficits in order to increase independence to a safe level, however feel more intense CIR level would be best follow-up to maximize functional gains.  Will continue to follow in acute care    Follow Up Recommendations  CIR;Supervision/Assistance - 24 hour    Equipment Recommendations  3 in 1 bedside comode    Recommendations for Other Services Rehab consult     Precautions / Restrictions Precautions Precautions: Fall Restrictions Weight Bearing Restrictions: No      Mobility Bed Mobility                  Transfers Overall transfer level: Needs assistance Equipment used: 1 person hand held assist Transfers: Sit to/from Stand;Stand Pivot Transfers Sit to Stand: Min assist Stand pivot transfers: Mod assist            Balance Overall balance assessment: Needs assistance   Sitting balance-Leahy Scale: Fair       Standing balance-Leahy Scale: Poor                              ADL Overall ADL's : Needs assistance/impaired Eating/Feeding: Independent   Grooming: Wash/dry hands;Minimal assistance;Standing Grooming Details (indicate cue type and  reason): simulated Upper Body Bathing: Supervision/ safety;Sitting   Lower Body Bathing: Minimal assistance;Sit to/from stand   Upper Body Dressing : Set up;Sitting   Lower Body Dressing: Minimal assistance;Sit to/from stand   Toilet Transfer: Moderate assistance Toilet Transfer Details (indicate cue type and reason): hand held assist simulated Toileting- Clothing Manipulation and Hygiene: Minimal assistance;Sit to/from stand       Functional mobility during ADLs: Moderate assistance General ADL Comments: Pt with increased LOB to the right with scissoring of gait with ambulation.  Need therapist's support to increase to mod assist.      Vision Vision Assessment?: No apparent visual deficits   Perception Perception Perception Tested?: No   Praxis Praxis Praxis tested?: Not tested    Pertinent Vitals/Pain Pain Assessment: No/denies pain     Hand Dominance Right   Extremity/Trunk Assessment Upper Extremity Assessment Upper Extremity Assessment: RUE deficits/detail RUE Deficits / Details: Overall strength 3+/5 in the arm and hand, 3/5 with shoulder flexion.  Slower finger to nose testing compared to the left   Lower Extremity Assessment Lower Extremity Assessment: Defer to PT evaluation   Cervical / Trunk Assessment Cervical / Trunk Assessment: Kyphotic   Communication Communication Communication: No difficulties   Cognition Arousal/Alertness: Awake/alert Behavior During Therapy: WFL for tasks assessed/performed Overall Cognitive Status: Impaired/Different from baseline Area of Impairment: Memory;Safety/judgement  Safety/Judgement: Decreased awareness of safety                    Home Living Family/patient expects to be discharged to:: Private residence Living Arrangements: Children (Son or somebody is there most of the time) Available Help at Discharge: Family Type of Home: House Home Access: Level entry     Home Layout: Two level;Able to  live on main level with bedroom/bathroom Alternate Level Stairs-Number of Steps: Pt lives on the first floor.   Bathroom Shower/Tub: Therapist, sports characteristics: Engineer, building services: Standard     Home Equipment: Information systems manager          Prior Functioning/Environment Level of Independence: Independent             OT Diagnosis: Generalized weakness;Cognitive deficits   OT Problem List: Decreased strength;Impaired balance (sitting and/or standing);Decreased coordination;Decreased knowledge of use of DME or AE   OT Treatment/Interventions: Self-care/ADL training;Neuromuscular education;DME and/or AE instruction;Therapeutic activities;Cognitive remediation/compensation;Patient/family education;Balance training    OT Goals(Current goals can be found in the care plan section) Acute Rehab OT Goals Patient Stated Goal: Pt did not state this session. OT Goal Formulation: With patient Time For Goal Achievement: 12/09/14 Potential to Achieve Goals: Good  OT Frequency: Min 2X/week   Barriers to D/C: Decreased caregiver support  his son works days so 24 hour would need to be arranged.          End of Session Equipment Utilized During Treatment: Gait belt Nurse Communication: Mobility status  Activity Tolerance: Patient tolerated treatment well Patient left: in chair;with chair alarm set;with nursing/sitter in room;with family/visitor present   Time: 1610-9604 OT Time Calculation (min): 25 min Charges:  OT General Charges $OT Visit: 1 Procedure OT Evaluation $Initial OT Evaluation Tier I: 1 Procedure  Caidance Sybert OTR/L 11/25/2014, 11:07 AM

## 2014-11-25 NOTE — Clinical Social Work Note (Signed)
Clinical Social Work Assessment  Patient Details  Name: Erik Adams MRN: [id number] Date of Birth: 08/09/1945  Date of referral:  11/25/14               Reason for consult:  Facility Placement, Discharge Planning                Permission sought to share information with:  Family Supports, Chartered certified accountant granted to share information::  Yes, Verbal Permission Granted  Name::     Diplomatic Services operational officer::  SNFs  Relationship::     Contact Information:     Housing/Transportation Living arrangements for the past 2 months:  [place] of Information:  Patient Patient Interpreter Needed:  None Criminal Activity/Legal Involvement Pertinent to Current Situation/Hospitalization:  No - Comment as needed Significant Relationships:  Adult Children Lives with:  Adult Children Do you feel safe going back to the place where you live?  Yes Need for family participation in patient care:  Yes (Comment)  Care giving concerns:  Patient does not report any concerns. CSW unable to reach family.  Social Worker assessment / plan:  CSW met with patient at bedside. Sitter also at bedside. Patient confirms that he lives at home with his son. The patient is somewhat agitated but is able to state where is at and understands that continued rehab has been recommended for him post DC. The patient is not opposed to SNF placement as alternative to CIR but wants CSW to speak with the son regarding placement options. CSW explained SNF search/placement process and answered the patient's questions. CSW made multiple attempts to reach the son and daughter by phone, but messages had to be left.   Employment status:  Retired Forensic scientist:  Medicare PT Recommendations:  Inpatient [place] / Referral to community resources:  [place]  Patient/Family's Response to care:  Patient appears somewhat agitated.  Patient/Family's Understanding of and  Emotional Response to Diagnosis, Current Treatment, and Prognosis:  Patient appears to have limited insight into reason for admission and his post DC needs.  Emotional Assessment Appearance:  Appears older than stated age Attitude/Demeanor/Rapport:  Avoidant, Other (Patient seems irritated by CSW's presence) Affect (typically observed):  Agitated, Flat, Irritable Orientation:  Oriented to Self, Oriented to Place, Oriented to Situation, Oriented to  Time Alcohol / Substance use:  Tobacco Use (Hx of) Psych involvement (Current and /or in the community):  No (Comment)  Discharge Needs  Concerns to be addressed:  Discharge Planning Concerns Readmission within the last 30 days:  Yes Current discharge risk:  Chronically ill, Physical Impairment, Cognitively Impaired Barriers to Discharge:  Continued Medical Work up   [employer] MSW, [place], [place], [phone]

## 2014-11-25 NOTE — Evaluation (Signed)
Physical Therapy Evaluation Patient Details Name: Erik Adams MRN: [id number] DOB: 03-11-1945 Today's Date: 11/25/2014   History of Present Illness  pt presents with HF Exacerbation and hx of HF, CAD, A-fib, CKD, L Atrial Thrombus.  pt was recommended to have ICD and pt refused.    Clinical Impression  Pt generally unsteady and requires A with mobility for safety.  Pt at this time would benefit from CIR at D/C to maximize independence and decrease burden of care.  Will continue to follow.      Follow Up Recommendations CIR    Equipment Recommendations  None recommended by PT    Recommendations for Other Services       Precautions / Restrictions Precautions Precautions: Fall Restrictions Weight Bearing Restrictions: No      Mobility  Bed Mobility Overal bed mobility: Needs Assistance Bed Mobility: Supine to Sit;Sit to Supine     Supine to sit: Supervision Sit to supine: Supervision   General bed mobility comments: pt needs increased time, but was able to complete without A.    Transfers Overall transfer level: Needs assistance Equipment used: Rolling walker (2 wheeled) Transfers: Sit to/from Stand Sit to Stand: Min assist Stand pivot transfers: Mod assist       General transfer comment: cues for safe technique.  Somewhat impulsive  Ambulation/Gait Ambulation/Gait assistance: Min assist Ambulation Distance (Feet): 80 Feet Assistive device: Rolling walker (2 wheeled) Gait Pattern/deviations: Step-through pattern;Decreased stride length;Trunk flexed     General Gait Details: pt reluctent to use RW and needs A for safety and balance during ambulation.    Stairs            Wheelchair Mobility    Modified Rankin (Stroke Patients Only)       Balance Overall balance assessment: Needs assistance Sitting-balance support: No upper extremity supported;Feet supported Sitting balance-Leahy Scale: Fair     Standing balance support: No upper extremity  supported;During functional activity Standing balance-Leahy Scale: Fair                               Pertinent Vitals/Pain Pain Assessment: No/denies pain    Home Living Family/patient expects to be discharged to:: Private residence Living Arrangements: Children (Son or somebody is there most of the time) Available Help at Discharge: Family Type of Home: House Home Access: Level entry     Home Layout: Two level;Able to live on main level with bedroom/bathroom Home Equipment: Shower seat      Prior Function Level of Independence: Independent               Hand Dominance   Dominant Hand: Right    Extremity/Trunk Assessment   Upper Extremity Assessment: Defer to OT evaluation RUE Deficits / Details: Overall strength 3+/5 in the arm and hand, 3/5 with shoulder flexion.  Slower finger to nose testing compared to the left         Lower Extremity Assessment: Generalized weakness      Cervical / Trunk Assessment: Kyphotic  Communication   Communication: No difficulties  Cognition Arousal/Alertness: Awake/alert Behavior During Therapy: Impulsive Overall Cognitive Status: Impaired/Different from baseline Area of Impairment: Memory;Safety/judgement;Attention   Current Attention Level: Selective Memory: Decreased short-term memory   Safety/Judgement: Decreased awareness of safety;Decreased awareness of deficits     General Comments: Unclear if this is pt's baseline cognition as no family rpesent.      General Comments      Exercises  Assessment/Plan    PT Assessment Patient needs continued PT services  PT Diagnosis Difficulty walking;Generalized weakness   PT Problem List Decreased strength;Decreased activity tolerance;Decreased balance;Decreased mobility;Decreased cognition;Decreased safety awareness;Decreased knowledge of use of DME  PT Treatment Interventions DME instruction;Gait training;Functional mobility training;Therapeutic  exercise;Therapeutic activities;Balance training;Cognitive remediation;Patient/family education   PT Goals (Current goals can be found in the Care Plan section) Acute Rehab PT Goals Patient Stated Goal: Pt did not state this session. PT Goal Formulation: With patient Time For Goal Achievement: 12/09/14 Potential to Achieve Goals: Good    Frequency Min 3X/week   Barriers to discharge        Co-evaluation               End of Session Equipment Utilized During Treatment: Gait belt Activity Tolerance: Patient tolerated treatment well Patient left: in bed;with call bell/phone within reach;with nursing/sitter in room Nurse Communication: Mobility status         Time: 0737-1062 PT Time Calculation (min) (ACUTE ONLY): 23 min   Charges:   PT Evaluation $Initial PT Evaluation Tier I: 1 Procedure PT Treatments $Gait Training: 8-22 mins   PT G CodesSunny Schlein, [place] [phone] 11/25/2014, 2:14 PM

## 2014-11-25 NOTE — Progress Notes (Signed)
Inpatient Rehabilitation  Patient was screened by Gustava Berland for appropriateness for an Inpatient Acute Rehab consult.  At this time, we are recommending Inpatient Rehab consult.  Please order when you feel appropriate.   Yzabella Crunk PT Inpatient Rehab Admissions Coordinator Cell [phone] Office [phone]   

## 2014-11-25 NOTE — Progress Notes (Signed)
Erik Adams ZOX:[id number] DOB: 09-05-1945 DOA: 11/22/2014 PCP: No PCP Per Patient  Brief narrative:  69 y/o ? 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 fib status post cardioversion 2013/2014 + left atrial thrombus Hypothyroidism Chronic elevated troponin Prior strokes on aspirin   Admitted with likely exacerbation CHF, Hyperkalemia It appears since 07/2014 he has had a gradual edecline His medications -lasix and metolazone were being adjusted as an OP by Cardiology--Note patient has h/o non-compliance on meds and refused to see PA for follow up but only wanted to see Dr. Tresa Endo [I am forwarding to Dr. Tresa Endo     Past medical history-As per Problem list Chart reviewed as below-   Consultants:  non  Procedures:  none  Antibiotics:  none   Subjective   Doing ok Laconic tol diet Ambulated c therapy No cp no sob No fever no chills   Objective    Interim History:   Telemetry:    Objective: Filed Vitals:   11/25/14 0642 11/25/14 1035 11/25/14 1059 11/25/14 1100  BP: 131/83     Pulse: 91 91 102   Temp: 97.8 F (36.6 C)     TempSrc: Oral     Resp: 18     Height:      Weight: 62.2 kg (137 lb 2 oz)     SpO2: 98% 96%  98%    Intake/Output Summary (Last 24 hours) at 11/25/14 1433 Last data filed at 11/25/14 1300  Gross per 24 hour  Intake    660 ml  Output   1875 ml  Net  -1215 ml    Exam:  General: eomi ncat Cardiovascular: no jvd no bruit Respiratory: decreased lung sounds posteriorly  Abdomen: soft nt nd  Skin no LE edema Neuro intact no focal deficit  Data Reviewed: Basic Metabolic Panel:  Recent Labs Lab  11/22/14 1434 11/23/14 0355 11/24/14 0345 11/24/14 1608 11/25/14 0330  NA 136 134* 136 138 136  K 5.4* 3.5 2.2* 2.8* 3.4*  CL 102 96* 88* 90* 94*  CO2 36* 29  GLUCOSE 123* 86 89 104* 101*  BUN 46* 42* 52* 54* 56*  CREATININE 2.27* 2.20* 2.31* 2.77* 2.49*  CALCIUM 9.3 8.8* 8.8* 9.0 8.5*  MG  --   --  1.8  --  2.1   Liver Function Tests:  Recent Labs Lab 11/24/14 0345  AST 37  ALT 43  ALKPHOS 66  BILITOT 1.0  PROT 5.6*  ALBUMIN 2.7*   No results for input(s): LIPASE, AMYLASE in the last 168 hours. No results for input(s): AMMONIA in the last 168 hours. CBC:  Recent Labs Lab 11/22/14 1434  WBC 9.4  HGB 10.2*  HCT 33.8*  MCV 81.8  PLT 284   Cardiac Enzymes:  Recent Labs Lab 11/23/14 0355 11/23/14 1002 11/23/14 1542 11/23/14 2129  TROPONINI 0.14* 0.19* 0.18* 0.16*   BNP: Invalid input(s): POCBNP CBG:  Recent Labs Lab 11/22/14 2055  GLUCAP 99    No results found for this or any previous visit (from the past 240 hour(s)).   Studies:              All Imaging reviewed and is as per  above notation   Scheduled Meds: . amiodarone  200 mg Oral Daily  . aspirin  81 mg Oral Daily  . carvedilol  12.5 mg Oral BID WC  . doxazosin  1 mg Oral QHS  . furosemide  80 mg Oral Daily  . hydrALAZINE  100 mg Oral TID  . isosorbide mononitrate  60 mg Oral BID  . metolazone  2.5 mg Oral Daily  . warfarin  5 mg Oral q1800  . Warfarin - Physician Dosing Inpatient   Does not apply q1800   Continuous Infusions:     Assessment/Plan:  1. Acute exac both sys/diastol HF- Fluid restrict/stict I/o [refusing Fole and pulled off Condom catheter].  Cont Metolazone 2.5 mg daily.  ? coreg 25-12.5 2/2 AECHF. patient not interested in  PPM--re-iterated needs ?close OP f/u Dr. Tresa Endo who patient trsuts--weight down 6 lbs, -2.4 liters so far 2. Isch CM c CAD h/o and Cardiac cachexia-?Patient hospice eligible.  Will continue discussion Dr. Tresa Endo as OP Cont. imdur 60 bid,  hydralazine 100 tid etc.  Elevated troponin likely 2/2 to Demand ischemia.  No further w/u as per patient as not wishing the same 3. Afib, Parox [id number]~8-.  Also LAD thrombus-continue Amiodarone 200 daily.  INR therapeutic 2.75-pharmacy dose coumadin 4. Hypokalemia-replacing Po but as still in 2.2-2.8 range, give 4 runs 11/24/14--now 3.4-Recheck K in am 5. R Renal Art stenosis + acute kidney injury 2/2 to diuretic CKD iii-iV- on lasix/metolazone.  PO lasix 80 od as of 10/14 am.  Will change to 80 bid on d/c.   6. Prior multiple CVA's-currently stable 7. Hypothyroid-TSH ~ 10/30/14 was 0.008.  Was taken off synthroid by Cardiology Dr. Tresa Endo. I have d/c this again.  Code Status:updated to DNR Family Communication: called son-no answer.  If remains stable consider D/c to CIR in 1-2 days Disposition Plan: CIr vs Home DVT prophylaxis: on coumadin Consultants: none  Pleas Koch, MD  [facility] Pager [phone] 11/25/2014, 2:33 PM    LOS: 3 days

## 2014-11-25 NOTE — Consult Note (Signed)
Physical Medicine and Rehabilitation Consult Reason for Consult: Exacerbation of congestive heart failure Referring Physician: [facility]   HPI: Erik Adams is a 69 y.o. right handed male with history of coronary artery disease status post stenting, diastolic congestive heart failure ejection fraction of 15%(refused ICD), atrial fibrillation, chronic renal insufficiency with baseline creatinine 2.27-2.77, left atrial thrombus on Coumadin therapy. Patient lives with her children and someone provides assistance as needed. Two-level home with bedroom downstairs and patient reported to be independent prior to admission. Presented 11/22/2014 with progressive shortness of breath as well as productive cough. Troponin 0.14-0.19 felt to be demand ischemia. Chest x-ray with mild CHF with interstitial edema. Responded well to IV diuresis. Physical therapy evaluation completed 11/25/2014. Patient progressing nicely. Recommendations made for physical medicine rehabilitation consult.   Review of Systems  Constitutional: Negative for fever and chills.  HENT: Negative for hearing loss.   Eyes: Negative for blurred vision and double vision.  Respiratory: Positive for cough and shortness of breath.   Cardiovascular: Positive for palpitations and leg swelling. Negative for chest pain.  Gastrointestinal: Positive for constipation. Negative for nausea, vomiting and abdominal pain.  Genitourinary: Negative for dysuria and hematuria.  Musculoskeletal: Positive for myalgias and joint pain.  Skin: Negative for rash.  Neurological: Negative for seizures, loss of consciousness, weakness and headaches.   Past Medical History  Diagnosis Date  . CAD (coronary artery disease)     myoview 03/02/12-EF 32%, mod fixed defect in the inf wall without significant ischemia; a. s/p BMS to mLAD 2000;  b. LHC 3/12:  sig ISR mLAD, RI 80-90%, Dx1 90% (too small for PCI);  PCI:  ION DES to mLAD, ION DES to RI  . HTN  (hypertension)   . Hypercholesteremia   . CVA (cerebral infarction)   . CRI (chronic renal insufficiency)     Baseline creatinine of 1.4 to 1.7  . 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 Atrial fibrillation (HCC) 11/13/2011    coumadin and amiodarone; s/p DCCV November 2013; repeat DCCV Feb 13, 2012  . Left atrial thrombus (HCC)     TEE 11/2011  . Iron deficiency anemia   . Cardiomyopathy, ischemic     echo 02/28/12-Ef 25%, mod mr, mild-mod TR, mod pul htn; refuses ICD  . CHF (congestive heart failure) [facility])    Past Surgical History  Procedure Laterality Date  . Coronary angioplasty with stent placement  2000    BMS to the LAD  . Mastoidectomy    . Coronary angioplasty with stent placement  March 2012    DES to LAD and Ramus intermdius  . Tee without cardioversion  12/06/2011    Procedure: TRANSESOPHAGEAL ECHOCARDIOGRAM (TEE);  Surgeon: Pricilla Riffle, MD;  Location[facility] ENDOSCOPY;  Service: Cardiovascular;  Laterality: N/A;  . Cardioversion  01/06/2012    Procedure: CARDIOVERSION;  Surgeon: Wendall Stade, MD;  Location: [facility] ENDOSCOPY;  Service: Cardiovascular;  Laterality: N/A;  call anesthesia  maybe able to do cardio. early at 12:00  . Cardioversion  02/13/2012    Procedure: CARDIOVERSION;  Surgeon: Cassell Clement, MD;  Location: [facility] ENDOSCOPY;  Service: Cardiovascular;  Laterality: N/A;  . Tonsillectomy    . Cholecystectomy  1999  . Left inguinal hernia repair  02/2007  . Hernia repair    . Coronary angioplasty with stent placement  04/2000    DES to LAD and ramus intermediate vessel   Family History  Problem Relation Age of Onset  .  Heart disease Father   . Heart failure Neg Hx    Social History:  reports that he quit smoking about 40 years ago. His smoking use included Cigarettes. He does not have any smokeless tobacco history on file. He reports that he does not drink alcohol or use illicit drugs. Allergies:  Allergies  Allergen Reactions  . Amlodipine  Shortness Of Breath, Swelling and Palpitations    "Caused  Heart condition-per patient"  . Ace Inhibitors Other (See Comments)    Unknown reaction  . Clonidine Derivatives Other (See Comments)    Unknown reaction   Medications Prior to Admission  Medication Sig Dispense Refill  . amiodarone (PACERONE) 200 MG tablet Take 1 tablet (200 mg total) by mouth daily. 30 tablet 1  . aspirin 81 MG chewable tablet Chew 1 tablet (81 mg total) by mouth daily.    . carvedilol (COREG) 25 MG tablet Take 25 mg by mouth 2 (two) times daily with a meal.    . doxazosin (CARDURA) 1 MG tablet Take 1 tablet (1 mg total) by mouth at bedtime. 30 tablet 6  . furosemide (LASIX) 40 MG tablet Take 2 tablets (80mg ) by mouth in the morning and 1 tablet in the afternoon. (Patient taking differently: Take 20 mg by mouth 3 (three) times daily. Take 2 tablets (80mg ) by mouth in the morning and 1 tablet in the afternoon.) 90 tablet 3  . hydrALAZINE (APRESOLINE) 50 MG tablet Take 100 mg by mouth 3 (three) times daily.     . isosorbide mononitrate (IMDUR) 60 MG 24 hr tablet Take 60 mg by mouth 2 (two) times daily.    Marland Kitchen ketorolac (ACULAR) 0.5 % ophthalmic solution Place 1 drop into the left eye 4 (four) times daily as needed. For dry eyes    . levothyroxine (SYNTHROID, LEVOTHROID) 25 MCG tablet Take 1 tablet (25 mcg total) by mouth daily before breakfast. 30 tablet 0  . metolazone (ZAROXOLYN) 2.5 MG tablet Take one tablet by mouth 30 min prior to lasix x 2-3 days (Patient taking differently: Take 2.5 mg by mouth. Take one tablet by mouth 30 min prior to lasix x 2-3 days) 3 tablet 0  . nitroGLYCERIN (NITROSTAT) 0.4 MG SL tablet Place 1 tablet (0.4 mg total) under the tongue every 5 (five) minutes x 3 doses as needed. For chest pain 25 tablet 6  . potassium chloride SA (K-DUR,KLOR-CON) 20 MEQ tablet Take 1 tablet (20 mEq total) by mouth daily. 30 tablet 6  . warfarin (COUMADIN) 5 MG tablet Take 5 mg by mouth daily.    . digoxin  (LANOXIN) 0.125 MG tablet Take 0.5 tablets (0.0625 mg total) by mouth daily. (Patient not taking: Reported on 11/22/2014) 30 tablet 3  . furosemide (LASIX) 80 MG tablet Take 1 tablet (80 mg total) by mouth daily. (Patient not taking: Reported on 11/22/2014) 30 tablet 1  . rosuvastatin (CRESTOR) 20 MG tablet Take 1 tablet (20 mg total) by mouth daily. (Patient not taking: Reported on 11/22/2014) 30 tablet 6    Home: Home Living Family/patient expects to be discharged to:: Private residence Living Arrangements: Children (Son or somebody is there most of the time) Available Help at Discharge: Family Type of Home: House Home Access: Level entry Home Layout: Two level, Able to live on main level with bedroom/bathroom Alternate Level Stairs-Number of Steps: Pt lives on the first floor. Bathroom Shower/Tub: Engineer, manufacturing systems: Standard Home Equipment: Banker History: Prior Function Level of Independence: Independent Functional  Status:  Mobility: Bed Mobility Overal bed mobility: Needs Assistance Bed Mobility: Supine to Sit, Sit to Supine Supine to sit: Supervision Sit to supine: Supervision General bed mobility comments: pt needs increased time, but was able to complete without A.   Transfers Overall transfer level: Needs assistance Equipment used: Rolling walker (2 wheeled) Transfers: Sit to/from Stand Sit to Stand: Min assist Stand pivot transfers: Mod assist General transfer comment: cues for safe technique.  Somewhat impulsive Ambulation/Gait Ambulation/Gait assistance: Min assist Ambulation Distance (Feet): 80 Feet Assistive device: Rolling walker (2 wheeled) Gait Pattern/deviations: Step-through pattern, Decreased stride length, Trunk flexed General Gait Details: pt reluctent to use RW and needs A for safety and balance during ambulation.      ADL: ADL Overall ADL's : Needs assistance/impaired Eating/Feeding: Independent Grooming: Wash/dry  hands, Minimal assistance, Standing Grooming Details (indicate cue type and reason): simulated Upper Body Bathing: Supervision/ safety, Sitting Lower Body Bathing: Minimal assistance, Sit to/from stand Upper Body Dressing : Set up, Sitting Lower Body Dressing: Minimal assistance, Sit to/from stand Toilet Transfer: Moderate assistance Toilet Transfer Details (indicate cue type and reason): hand held assist simulated Toileting- Clothing Manipulation and Hygiene: Minimal assistance, Sit to/from stand Functional mobility during ADLs: Moderate assistance General ADL Comments: Pt with increased LOB to the right with scissoring of gait with ambulation.  Need therapist's support to increase to mod assist.   Cognition: Cognition Overall Cognitive Status: Impaired/Different from baseline Orientation Level: Oriented X4 Cognition Arousal/Alertness: Awake/alert Behavior During Therapy: Impulsive Overall Cognitive Status: Impaired/Different from baseline Area of Impairment: Memory, Safety/judgement, Attention Current Attention Level: Selective Memory: Decreased short-term memory Safety/Judgement: Decreased awareness of safety, Decreased awareness of deficits General Comments: Unclear if this is pt's baseline cognition as no family rpesent.    Blood pressure 131/83, pulse 102, temperature 97.8 F (36.6 C), temperature source Oral, resp. rate 18, height  (1.778 m), weight 62.2 kg (137 lb 2 oz), SpO2 98 %. Physical Exam  Constitutional: He is oriented to person, place, and time.  HENT:  Head: Normocephalic.  Eyes: EOM are normal. Left eye exhibits no discharge.  Neck: Normal range of motion. Neck supple. No thyromegaly present.  Cardiovascular: Exam reveals no friction rub.   Cardiac rate control  Respiratory: Effort normal and breath sounds normal.  GI: Soft. Bowel sounds are normal. He exhibits no distension. There is no tenderness.  Neurological: He is oriented to person, place, and  time.  Patient Is eyes closed during exam but was appropriate. Oriented 3 and follows commands  Skin: Skin is warm and dry.    Results for orders placed or performed during the hospital encounter of 11/22/14 (from the past 24 hour(s))  Basic metabolic panel     Status: Abnormal   Collection Time: 11/24/14  4:08 PM  Result Value Ref Range   Sodium 138 135 - 145 mmol/L   Potassium 2.8 (L) 3.5 - 5.1 mmol/L   Chloride 90 (L) 101 - 111 mmol/L   CO2 36 (H) 22 - 32 mmol/L   Glucose, Bld 104 (H) 65 - 99 mg/dL   BUN 54 (H) 6 - 20 mg/dL   Creatinine, Ser 1.61 (H) 0.61 - 1.24 mg/dL   Calcium 9.0 8.9 - 09.6 mg/dL   GFR calc non Af Amer 22 (L) >60 mL/min   GFR calc Af Amer 25 (L) >60 mL/min   Anion gap 12 5 - 15  Protime-INR     Status: Abnormal   Collection Time: 11/25/14  3:30 AM  Result Value Ref Range   Prothrombin Time 28.7 (H) 11.6 - 15.2 seconds   INR 2.75 (H) 0.00 - 1.49  Basic metabolic panel     Status: Abnormal   Collection Time: 11/25/14  3:30 AM  Result Value Ref Range   Sodium 136 135 - 145 mmol/L   Potassium 3.4 (L) 3.5 - 5.1 mmol/L   Chloride 94 (L) 101 - 111 mmol/L   CO2 29 22 - 32 mmol/L   Glucose, Bld 101 (H) 65 - 99 mg/dL   BUN 56 (H) 6 - 20 mg/dL   Creatinine, Ser 1.61 (H) 0.61 - 1.24 mg/dL   Calcium 8.5 (L) 8.9 - 10.3 mg/dL   GFR calc non Af Amer 25 (L) >60 mL/min   GFR calc Af Amer 29 (L) >60 mL/min   Anion gap 13 5 - 15  Magnesium     Status: None   Collection Time: 11/25/14  3:30 AM  Result Value Ref Range   Magnesium 2.1 1.7 - 2.4 mg/dL   No results found.  Assessment/Plan: Diagnosis: CHF exacerbation 1. Does the need for close, 24 hr/day medical supervision in concert with the patient's rehab needs make it unreasonable for this patient to be served in a less intensive setting? No 2. Co-Morbidities requiring supervision/potential complications:   3. Due to bladder management, bowel management, safety, disease management and medication administration,  does the patient require 24 hr/day rehab nursing? No 4. Does the patient require coordinated care of a physician, rehab nurse, PT (1-2 hrs/day, 5 days/week) and OT (1-2 hrs/day, 5 days/week) to address physical and functional deficits in the context of the above medical diagnosis(es)? No Addressing deficits in the following areas: balance, endurance, locomotion, strength, transferring, bowel/bladder control, bathing and dressing  5. Can the patient actively participate in an intensive therapy program of at least 3 hrs of therapy per day at least 5 days per week? Potentially 6. The potential for patient to make measurable gains while on inpatient rehab is fair 7. Anticipated functional outcomes upon discharge from inpatient rehab are n/a  with PT, n/a with OT, n/a with SLP. 8. Estimated rehab length of stay to reach the above functional goals is: n/a 9. Does the patient have adequate social supports and living environment to accommodate these discharge functional goals? N/A 10. Anticipated D/C setting: Home 11. Anticipated post D/C treatments: HH therapy 12. Overall Rehab/Functional Prognosis: good  RECOMMENDATIONS: This patient's condition is appropriate for continued rehabilitative care in the following setting: [facility] Therapy Patient has agreed to participate in recommended program. Potentially Note that insurance prior authorization may be required for reimbursement for recommended care.  Comment: This fairly sedentary patient who was admitted on 11/22/14 is already at a minimal assistance level. He is nearly ready for dc from the hospital from a medical standpoint and apearrs to have good family supports. Don't think we would be able to justify an inpatient rehab admission.    Ranelle Oyster, MD, [facility] Physical Medicine & Rehabilitation 11/25/2014    11/25/2014

## 2014-11-26 LAB — BASIC METABOLIC PANEL
ANION GAP: 12 (ref 5–15)
BUN: 62 mg/dL — ABNORMAL HIGH (ref 6–20)
CALCIUM: 9.2 mg/dL (ref 8.9–10.3)
CO2: 34 mmol/L — ABNORMAL HIGH (ref 22–32)
CREATININE: 2.51 mg/dL — AB (ref 0.61–1.24)
Chloride: 93 mmol/L — ABNORMAL LOW (ref 101–111)
GFR calc non Af Amer: 24 mL/min — ABNORMAL LOW (ref >60)
GFR, EST AFRICAN AMERICAN: 28 mL/min — AB (ref >60)
Glucose, Bld: 95 mg/dL (ref 65–99)
Potassium: 2.3 mmol/L — CL (ref 3.5–5.1)
SODIUM: 139 mmol/L (ref 135–145)

## 2014-11-26 LAB — PROTIME-INR
INR: 2.98 — ABNORMAL HIGH (ref 0.00–1.49)
PROTHROMBIN TIME: 30.5 s — AB (ref 11.6–15.2)

## 2014-11-26 MED ORDER — POTASSIUM CHLORIDE 10 MEQ/100ML IV SOLN
10.0000 meq | INTRAVENOUS | Status: AC
  Administered 2014-11-26 (×5): 10 meq via INTRAVENOUS
  Filled 2014-11-26 (×5): qty 100

## 2014-11-26 MED ORDER — POTASSIUM CHLORIDE CRYS ER 20 MEQ PO TBCR
40.0000 meq | EXTENDED_RELEASE_TABLET | Freq: Three times a day (TID) | ORAL | Status: DC
  Administered 2014-11-26 – 2014-11-27 (×5): 40 meq via ORAL
  Filled 2014-11-26 (×5): qty 2

## 2014-11-26 NOTE — Progress Notes (Signed)
Erik Adams FBP:[id number] DOB: 1946/01/05 DOA: 11/22/2014 PCP: No PCP Per Patient  Brief narrative:  69 y/o ? 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 fib status post cardioversion 2013/2014 + left atrial thrombus Hypothyroidism Chronic elevated troponin Prior strokes on aspirin   Admitted with likely exacerbation CHF, Hyperkalemia It appears since 07/2014 he has had a gradual edecline His medications -lasix and metolazone were being adjusted as an OP by Cardiology--Note patient has h/o non-compliance on meds and refused to see PA for follow up but only wanted to see Dr. Tresa Endo [I am forwarding to Dr. Tresa Endo     Past medical history-As per Problem list Chart reviewed as below-   Consultants:  non  Procedures:  none  Antibiotics:  none   Subjective  Well No chest pain Laconic and sleeping Tech reports no issues Eating diet well    Objective    Interim History:   Telemetry:    Objective: Filed Vitals:   11/25/14 1100 11/25/14 1706 11/25/14 2052 11/26/14 0530  BP:  127/83 133/87 131/85  Pulse:  92 86 76  Temp:  97.4 F (36.3 C) 98.3 F (36.8 C) 97.8 F (36.6 C)  TempSrc:  Oral Oral Oral  Resp:  18 16 18   Height:      Weight:      SpO2: 98% 98% 98% 92%    Intake/Output Summary (Last 24 hours) at 11/26/14 1136 Last data filed at 11/26/14 0938  Gross per 24 hour  Intake   1619 ml  Output   2300 ml  Net   -681 ml    Exam:  General: eomi ncat Cardiovascular: no jvd no bruit Respiratory: decreased lung sounds posteriorly  Abdomen: soft nt nd  Skin no LE edema Neuro intact no focal deficit  Data Reviewed: Basic  Metabolic Panel:  Recent Labs Lab 11/23/14 0355 11/24/14 0345 11/24/14 1608 11/25/14 0330 11/26/14 0301  NA 134* 136 138 136 139  K 3.5 2.2* 2.8* 3.4* 2.3*  CL 96* 88* 90* 94* 93*  CO2 25 30 36* 29 34*  GLUCOSE 86 89 104* 101* 95  BUN 42* 52* 54* 56* 62*  CREATININE 2.20* 2.31* 2.77* 2.49* 2.51*  CALCIUM 8.8* 8.8* 9.0 8.5* 9.2  MG  --  1.8  --  2.1  --    Liver Function Tests:  Recent Labs Lab 11/24/14 0345  AST 37  ALT 43  ALKPHOS 66  BILITOT 1.0  PROT 5.6*  ALBUMIN 2.7*   No results for input(s): LIPASE, AMYLASE in the last 168 hours. No results for input(s): AMMONIA in the last 168 hours. CBC:  Recent Labs Lab 11/22/14 1434  WBC 9.4  HGB 10.2*  HCT 33.8*  MCV 81.8  PLT 284   Cardiac Enzymes:  Recent Labs Lab 11/23/14 0355 11/23/14 1002 11/23/14 1542 11/23/14 2129  TROPONINI 0.14* 0.19* 0.18* 0.16*   BNP: Invalid input(s): POCBNP CBG:  Recent Labs Lab 11/22/14 2055  GLUCAP 99    No results found for this or any previous visit (from the past 240 hour(s)).   Studies:              All Imaging reviewed and is as per above  notation   Scheduled Meds: . amiodarone  200 mg Oral Daily  . aspirin  81 mg Oral Daily  . carvedilol  12.5 mg Oral BID WC  . doxazosin  1 mg Oral QHS  . furosemide  80 mg Oral Daily  . hydrALAZINE  100 mg Oral TID  . isosorbide mononitrate  60 mg Oral BID  . potassium chloride  40 mEq Oral TID  . warfarin  5 mg Oral q1800  . Warfarin - Physician Dosing Inpatient   Does not apply q1800   Continuous Infusions:     Assessment/Plan:  Acute exac both sys/diastol HF- Fluid restrict/stict I/o [refusing Fole and pulled off Condom catheter].  Cont Metolazone 2.5 mg daily.  ? coreg 25-12.5 2/2 AECHF. patient not interested in  PPM--re-iterated needs ?close OP f/u Dr. Tresa Endo --weight ? 6 lbs, -2.83 liters so far 1. Isch CM c CAD h/o and Cardiac cachexia-Will continue discussion Dr. Tresa Endo as OP Cont. imdur 60 bid, hydralazine  100 tid etc.  Elevated troponin likely 2/2 to Demand ischemia.  No further w/u as per patient as not wishing the same 2. Afib, Parox [id number]-.  Also LAD thrombus-continue Amiodarone 200 daily.  INR therapeutic 2.98-pharmacy dose coumadin 3. Hypokalemia-replacing Po but as still in 2.2-2.8 range.  I'm not convinced he is taking his diuretics at home as he was only on 20 mEq. Repeating IV runs 11/26/14.  Zaroxolyn held 10/15.  By mouth potassium 40 3 times a day as potassium 2.2. Recheck K in am 4. Cardiorenal syndrome plus R Renal Art stenosis + acute kidney injury 2/2 to diuretic CKD iii-iV- on lasix/metolazone.  PO lasix 80 od as of 10/14 am. Further discussion as OP-->Dr. Tresa Endo regarding goals as well as cardiorenal syndrome   5. Prior multiple CVA's-currently stable 6. Hypothyroid-TSH ~ 10/30/14 was 0.008.  Was taken off synthroid by Cardiology Dr. Tresa Endo. I have d/c this again.  Code Status:updated to DNR Family Communication: No family at bedside-I did call patient's son and daughter of numbers provided and did not receive a response Disposition Plan: Not a candidate for CIR-likely will need discharge home DVT prophylaxis: on coumadin Consultants: none  Pleas Koch, MD  [facility] Pager [phone] 11/26/2014, 11:36 AM    LOS: 4 days

## 2014-11-26 NOTE — Progress Notes (Signed)
CRITICAL VALUE ALERT  Critical value received:  Potassium 2.3  Date of notification:  11/26/2014  Time of notification:  0428  Critical value read back:Yes.    Nurse who received alert:  Elita Boone  MD notified (1st page):  Claiborne Billings   Time of first page:  [phone]   MD notified (2nd page):  Time of second page:  Responding MD:  [phone]   Time MD responded:  Claiborne Billings,  Placed new orders

## 2014-11-27 LAB — BASIC METABOLIC PANEL
ANION GAP: 11 (ref 5–15)
BUN: 68 mg/dL — AB (ref 6–20)
CO2: 30 mmol/L (ref 22–32)
Calcium: 9.2 mg/dL (ref 8.9–10.3)
Chloride: 95 mmol/L — ABNORMAL LOW (ref 101–111)
Creatinine, Ser: 2.45 mg/dL — ABNORMAL HIGH (ref 0.61–1.24)
GFR calc Af Amer: 29 mL/min — ABNORMAL LOW (ref >60)
GFR, EST NON AFRICAN AMERICAN: 25 mL/min — AB (ref >60)
Glucose, Bld: 123 mg/dL — ABNORMAL HIGH (ref 65–99)
Potassium: 3.3 mmol/L — ABNORMAL LOW (ref 3.5–5.1)
Sodium: 136 mmol/L (ref 135–145)

## 2014-11-27 MED ORDER — POTASSIUM CHLORIDE CRYS ER 20 MEQ PO TBCR: 40.0000 meq | EXTENDED_RELEASE_TABLET | Freq: Two times a day (BID) | ORAL | Status: AC

## 2014-11-27 NOTE — Discharge Summary (Addendum)
Physician Discharge Summary  Erik Adams NGE:[id number] DOB: January 31, 1946 DOA: 11/22/2014  PCP: No PCP Per Patient  Admit date: 11/22/2014 Discharge date: 11/27/2014  Time spent: 35 minutes  Recommendations for Outpatient Follow-up:  1. Needs a chem 12 as well as CBC and magnesium checked in a week 2. Needs close outpatient follow-up with Dr. Tresa Endo 3. 1500 cc fluid restriction 4. Extra Lasix 80 mg if he gains more than 2 kg over 24-hour period of time 5. Low-salt diet  Discharge Diagnoses:  Active Problems:   CHF (congestive heart failure) (HCC)   Heart failure (HCC)   Discharge Condition: Fair  Diet recommendation: Heart healthy low-salt fluid restriction 1500 cc  Filed Weights   11/24/14 0500 11/25/14 0642 11/27/14 0423  Weight: 62.324 kg (137 lb 6.4 oz) 62.2 kg (137 lb 2 oz) 61.598 kg (135 lb 12.8 oz)    History of present illness:  69 y/o ? 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 fib status post cardioversion 2013/2014 + left atrial thrombus Hypothyroidism Chronic elevated troponin Prior strokes on aspirin   Admitted with likely exacerbation CHF, Hyperkalemia It appears since 07/2014 he has had a gradual edecline  Hospital Course:    1. Acute exac both sys/diastol HF- Fluid restrict/stict I/o [refusing Fole and pulled off Condom catheter].  ? coreg 25->12.5 on admission but resumed normal dose subsequently. patient not interested in PPM--re-iterated needs ?close OP f/u Dr. Tresa Endo --weight ? 6 lbs, -4.028 on substantially less medications than he was on on admission MAR--discharge medications include-metolazone discontinued, Lasix dose changed to  80 daily 2. Isch CM c CAD h/o and Cardiac cachexia-Will continue discussion Dr. Tresa Endo as OP Cont. imdur 60 bid, hydralazine 100 tid , Coreg resumed at home dose 25 twice a day (was on 12.5 twice a day in hospital), continue aspirin 81 daily Elevated troponin likely 2/2 to Demand ischemia. No further w/u as per patient as not wishing the same 3. Afib, Parox [id number]-. Also LAD thrombus-continue Amiodarone 200 daily. INR therapeutic 2. continue Coumadin 5 mg on discharge--obtain LFTs within a week as an outpatient. Have resumed digoxin 0.125 4. Hypokalemia-replacing Po but as still in 2.2-2.8 range. I'm not convinced he is taking his diuretics at home as he was only on 20 mEq. given IV runs 11/26/14. Zaroxolyn held 10/15 and ultimately discontinued. Transitioned potassium 40 3 times a day to twice daily on discharge. Recheck K as well as mag as an outpatient. Potassium on discharge was 3.3 5. Cardiorenal syndrome plus R Renal Art stenosis + acute kidney injury 2/2 to diuretic CKD iii-iV- on lasix/metolazone. PO lasix 80 od as of 10/14 am. Further discussion as OP-->Dr. Tresa Endo regarding goals as well as cardiorenal syndrome  6. Prior multiple CVA's-currently stable 7. Hypothyroid-TSH ~ 10/30/14 was 0.008. Was taken off synthroid by Cardiology Dr. Tresa Endo. I have d/c this again.  Discharge Exam: Filed Vitals:   11/27/14 0945  BP: 128/95  Pulse:   Temp:   Resp:    Alert pleasant ready to go General: EOMI NCAT Cardiovascular: S1-S2 no murmur rub or gallop sinus rhythm Respiratory: Clinically clear no added sound  Discharge Instructions   Discharge Instructions    Diet - low sodium  heart healthy    Complete by:  As directed      Discharge instructions    Complete by:  As directed   Please follow-up with outpatient cardiologist Dr. Tresa Endo Recommend some changes to meds as per med list. Recommend fluid restriction 1500 cc per day Recommend daily weight checks and if more than 2 daily  grams gained over a 24-hour period of time recommend an extra dose of Lasix 80 mg at that time. He does not need any further therapy at home and has declined home health nursing services to check on him     Increase activity slowly    Complete by:  As directed           Current Discharge Medication List    CONTINUE these medications which have CHANGED   Details  potassium chloride SA (K-DUR,KLOR-CON) 20 MEQ tablet Take 2 tablets (40 mEq total) by mouth 2 (two) times daily. Qty: 60 tablet, Refills: 0      CONTINUE these medications which have NOT CHANGED   Details  amiodarone (PACERONE) 200 MG tablet Take 1 tablet (200 mg total) by mouth daily. Qty: 30 tablet, Refills: 1    aspirin 81 MG chewable tablet Chew 1 tablet (81 mg total) by mouth daily.    carvedilol (COREG) 25 MG tablet Take 25 mg by mouth 2 (two) times daily with a meal.    doxazosin (CARDURA) 1 MG tablet Take 1 tablet (1 mg total) by mouth at bedtime. Qty: 30 tablet, Refills: 6    hydrALAZINE (APRESOLINE) 50 MG tablet Take 100 mg by mouth 3 (three) times daily.     isosorbide mononitrate (IMDUR) 60 MG 24 hr tablet Take 60 mg by mouth 2 (two) times daily.    ketorolac (ACULAR) 0.5 % ophthalmic solution Place 1 drop into the left eye 4 (four) times daily as needed. For dry eyes    nitroGLYCERIN (NITROSTAT) 0.4 MG SL tablet Place 1 tablet (0.4 mg total) under the tongue every 5 (five) minutes x 3 doses as needed. For chest pain Qty: 25 tablet, Refills: 6    warfarin (COUMADIN) 5 MG tablet Take 5 mg by mouth daily.    digoxin (LANOXIN) 0.125 MG tablet Take 0.5 tablets (0.0625 mg total) by mouth daily. Qty: 30 tablet, Refills: 3    furosemide (LASIX) 80 MG tablet Take 1 tablet (80 mg total) by mouth daily. Qty: 30 tablet, Refills: 1    rosuvastatin (CRESTOR) 20 MG tablet Take 1 tablet (20 mg total) by mouth daily. Qty: 30 tablet, Refills: 6      STOP taking these medications     levothyroxine (SYNTHROID,  LEVOTHROID) 25 MCG tablet      metolazone (ZAROXOLYN) 2.5 MG tablet        Allergies  Allergen Reactions  . Amlodipine Shortness Of Breath, Swelling and Palpitations    "Caused  Heart condition-per patient"  . Ace Inhibitors Other (See Comments)    Unknown reaction  . Clonidine Derivatives Other (See Comments)    Unknown reaction      The results of significant diagnostics from this hospitalization (including imaging, microbiology, ancillary and laboratory) are listed below for reference.    Significant Diagnostic Studies: Dg Chest 2 View  11/22/2014  CLINICAL DATA:  Weakness and shortness of Breath EXAM: CHEST - 2 VIEW COMPARISON:  10/21/2014 FINDINGS: Cardiac shadow is enlarged in size. Mild central vascular congestion is seen. Patient rotation to the right is noted accentuating the mediastinal markings. Mild interstitial  changes are seen consistent with edema. No focal infiltrate is noted. IMPRESSION: Mild CHF with interstitial edema. Electronically Signed   By: Alcide Clever M.D.   On: 11/22/2014 18:45   Dg Chest 2 View  10/31/2014  CLINICAL DATA:  69 year old male with congestive heart failure EXAM: CHEST  2 VIEW COMPARISON:  Prior chest x-ray 10/29/2014 FINDINGS: Stable cardiomegaly. Metallic stents project over the left heart. Atherosclerotic calcifications again noted in the transverse aorta. The lungs are hyperinflated with central bronchitic change and mild upper lung predominant emphysema. Small bilateral pleural effusions have largely resolved. Focus of probable pleural parenchymal scarring in the periphery of the right base appears less nodular on today's examination. No pulmonary edema. No acute osseous abnormality. IMPRESSION: 1. Resolved bilateral pleural effusions. 2. The nodular density in the periphery of the right lung base appears more linear and scar-like on today's evaluation. Recommend 1 additional follow-up chest x-ray in 4 weeks to exclude a true underlying  pulmonary nodule. 3. Stable cardiomegaly without evidence of pulmonary edema. Electronically Signed   By: Malachy Moan M.D.   On: 10/31/2014 07:46   Dg Chest 2 View  10/29/2014  CLINICAL DATA:  Shortness of breath, cough. EXAM: CHEST  2 VIEW COMPARISON:  08/17/2014 FINDINGS: Cardiomediastinal silhouette is enlarged. Mediastinal contours appear intact. Thoracic aorta is torturous and contains atherosclerotic calcifications. Coronary artery stents are seen. There is no evidence of pneumothorax. There are small bilateral pleural effusions. There is mild coarsening of the interstitial markings. There is a 15 mm opacity seen overlying the periphery of the right lower lung on the frontal view. Osseous structures are without acute abnormality. Soft tissues are grossly normal. IMPRESSION: Cardiomegaly. Small bilateral pleural effusions, and mild pulmonary vascular congestion. 15 mm soft tissue density overlying the right lower lung. This may represent an area of round atelectasis, however follow-up with PA and lateral radiograph, or chest CT, is recommended to assure resolution, as subpleural soft tissue mass cannot be excluded. Electronically Signed   By: Ted Mcalpine M.D.   On: 10/29/2014 20:12    Microbiology: No results found for this or any previous visit (from the past 240 hour(s)).   Labs: Basic Metabolic Panel:  Recent Labs Lab 11/24/14 0345 11/24/14 1608 11/25/14 0330 11/26/14 0301 11/27/14 0822  NA 136 138 136 139 136  K 2.2* 2.8* 3.4* 2.3* 3.3*  CL 88* 90* 94* 93* 95*  CO2 30 36* 29 34* 30  GLUCOSE 89 104* 101* 95 123*  BUN 52* 54* 56* 62* 68*  CREATININE 2.31* 2.77* 2.49* 2.51* 2.45*  CALCIUM 8.8* 9.0 8.5* 9.2 9.2  MG 1.8  --  2.1  --   --    Liver Function Tests:  Recent Labs Lab 11/24/14 0345  AST 37  ALT 43  ALKPHOS 66  BILITOT 1.0  PROT 5.6*  ALBUMIN 2.7*   No results for input(s): LIPASE, AMYLASE in the last 168 hours. No results for input(s): AMMONIA in  the last 168 hours. CBC:  Recent Labs Lab 11/22/14 1434  WBC 9.4  HGB 10.2*  HCT 33.8*  MCV 81.8  PLT 284   Cardiac Enzymes:  Recent Labs Lab 11/23/14 0355 11/23/14 1002 11/23/14 1542 11/23/14 2129  TROPONINI 0.14* 0.19* 0.18* 0.16*   BNP: BNP (last 3 results)  Recent Labs  08/16/14 0350 10/29/14 2054 11/22/14 1815  BNP 905.7* 2688.1* 3115.6*    ProBNP (last 3 results) No results for input(s): PROBNP in the last 8760 hours.  CBG:  Recent Labs  Lab 11/22/14 2055  GLUCAP 99       SignedRhetta Mura  [facility] 11/27/2014, 11:17 AM

## 2014-11-28 ENCOUNTER — Ambulatory Visit: Payer: Medicare Other | Admitting: Pharmacist Clinician (PhC)/ Clinical Pharmacy Specialist

## 2014-12-01 ENCOUNTER — Encounter (INDEPENDENT_AMBULATORY_CARE_PROVIDER_SITE_OTHER): Payer: Medicare Other | Admitting: Ophthalmology

## 2014-12-16 ENCOUNTER — Telehealth: Payer: Self-pay | Admitting: Cardiovascular Disease

## 2014-12-19 ENCOUNTER — Telehealth: Payer: Self-pay | Admitting: Cardiovascular Disease

## 2014-12-19 ENCOUNTER — Ambulatory Visit: Payer: Medicare Other | Admitting: Cardiovascular Disease

## 2014-12-19 NOTE — Telephone Encounter (Addendum)
12-23-14 Received a death certificate that was brought in from [employer].  I gave death certificate to Dr. Tresa Endo to sign. cbr  12/23/14 Received signed death certificate back from Dr. Tresa Endo, I made copy of it and call funeral home to come pick-up.  cbr

## 2015-01-12 NOTE — Telephone Encounter (Signed)
Needs to speak with Dr. Tresa Endo regarding a death certificate.

## 2015-01-12 NOTE — Telephone Encounter (Signed)
Dr Tresa Endo spoke to EMS. Awaiting for death certificate for signature - next week

## 2015-01-12 DEATH — deceased

## 2016-08-03 IMAGING — DX DG CHEST 1V PORT
1 series · 1 of 1 positions shown · non-contrast
Comparison: 02/29/2012

CLINICAL DATA: Shortness of breath

EXAM:
PORTABLE CHEST - 1 VIEW

[chest ap]
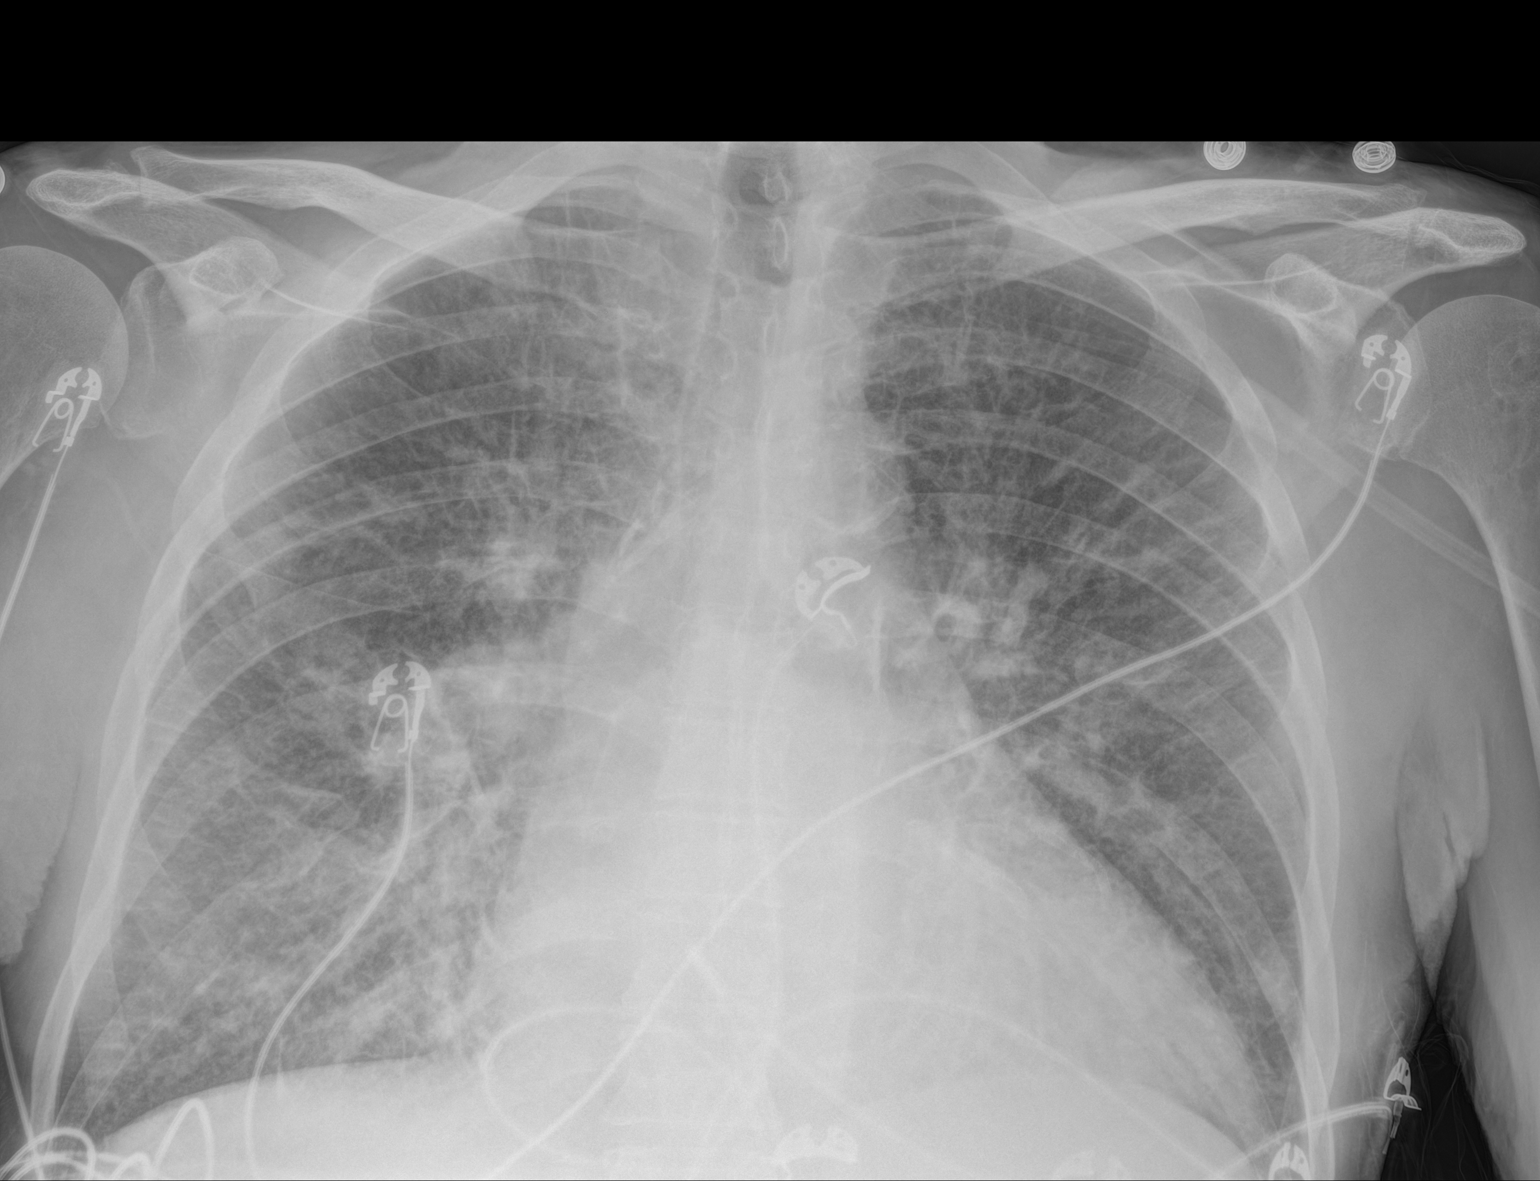

[1 of 1 positions shown; findings below may reference images not displayed]

FINDINGS: There is chronic cardiomegaly. Coronary stents are noted. Stable and
negative aortic and hilar contours for technique. Diffuse aortic
atherosclerosis is present.

Diffuse interstitial opacity with Kerley lines. No evidence for
pneumonia. No significant pleural effusion.
IMPRESSION: CHF.

## 2016-08-04 IMAGING — DX DG CHEST 1V PORT
1 series · 1 of 1 positions shown · non-contrast
Comparison: Chest x-ray from yesterday

CLINICAL DATA: CHF

EXAM:
PORTABLE CHEST - 1 VIEW

[chest ap]
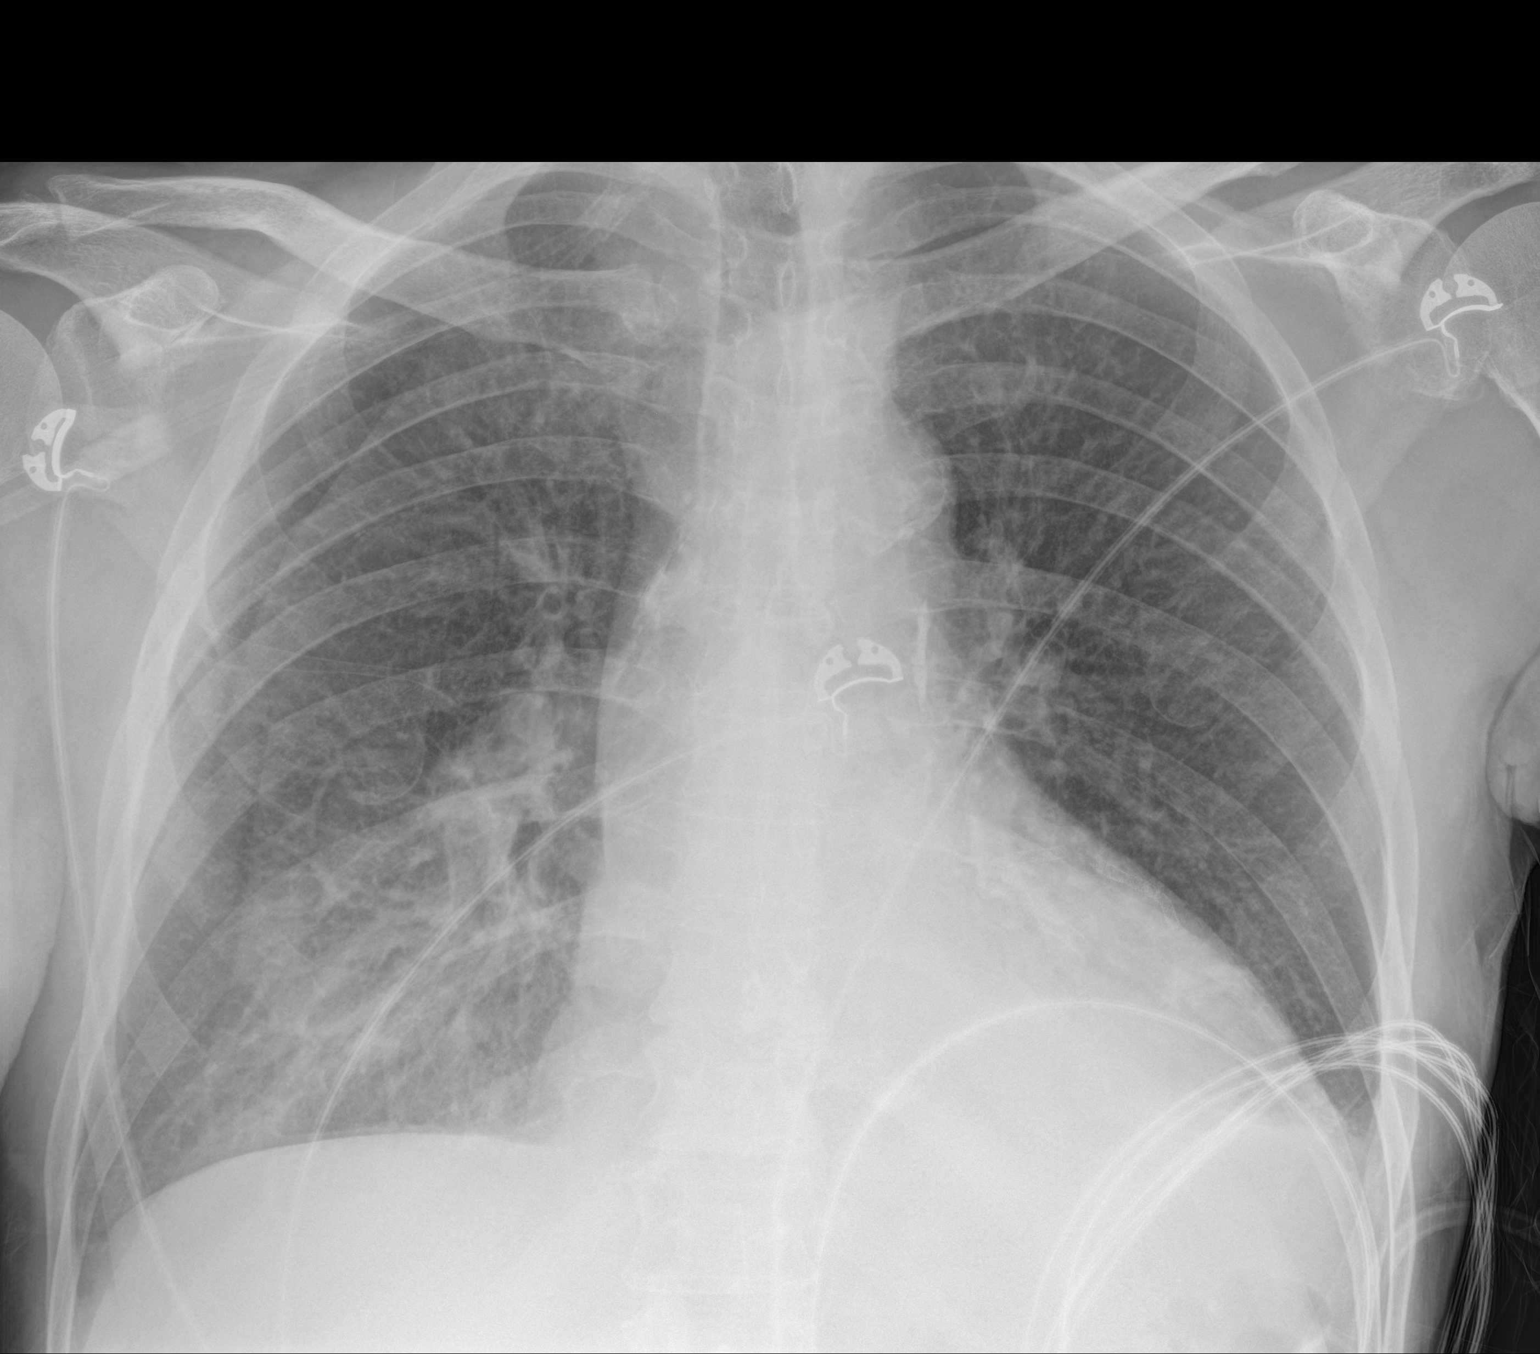

[1 of 1 positions shown; findings below may reference images not displayed]

FINDINGS: Improved pulmonary edema, although still present at the bases. There
is worsening opacity at the medial left base which likely reflects
atelectasis given the timing, obscuring the diaphragm. Stable
cardiomegaly. Coronary stents again noted. Negative aortic and hilar
contours. Skin folds on the right. No pneumothorax.
IMPRESSION: 1. Improved pulmonary edema.
2. Worsening left lower lobe aeration with rapid development
suggesting atelectasis.

## 2016-10-17 IMAGING — DX DG CHEST 2V
2 series · 2 of 2 positions shown · non-contrast
Comparison: 08/17/2014

CLINICAL DATA: Shortness of breath, cough.

EXAM:
CHEST  2 VIEW

[w chest pa]
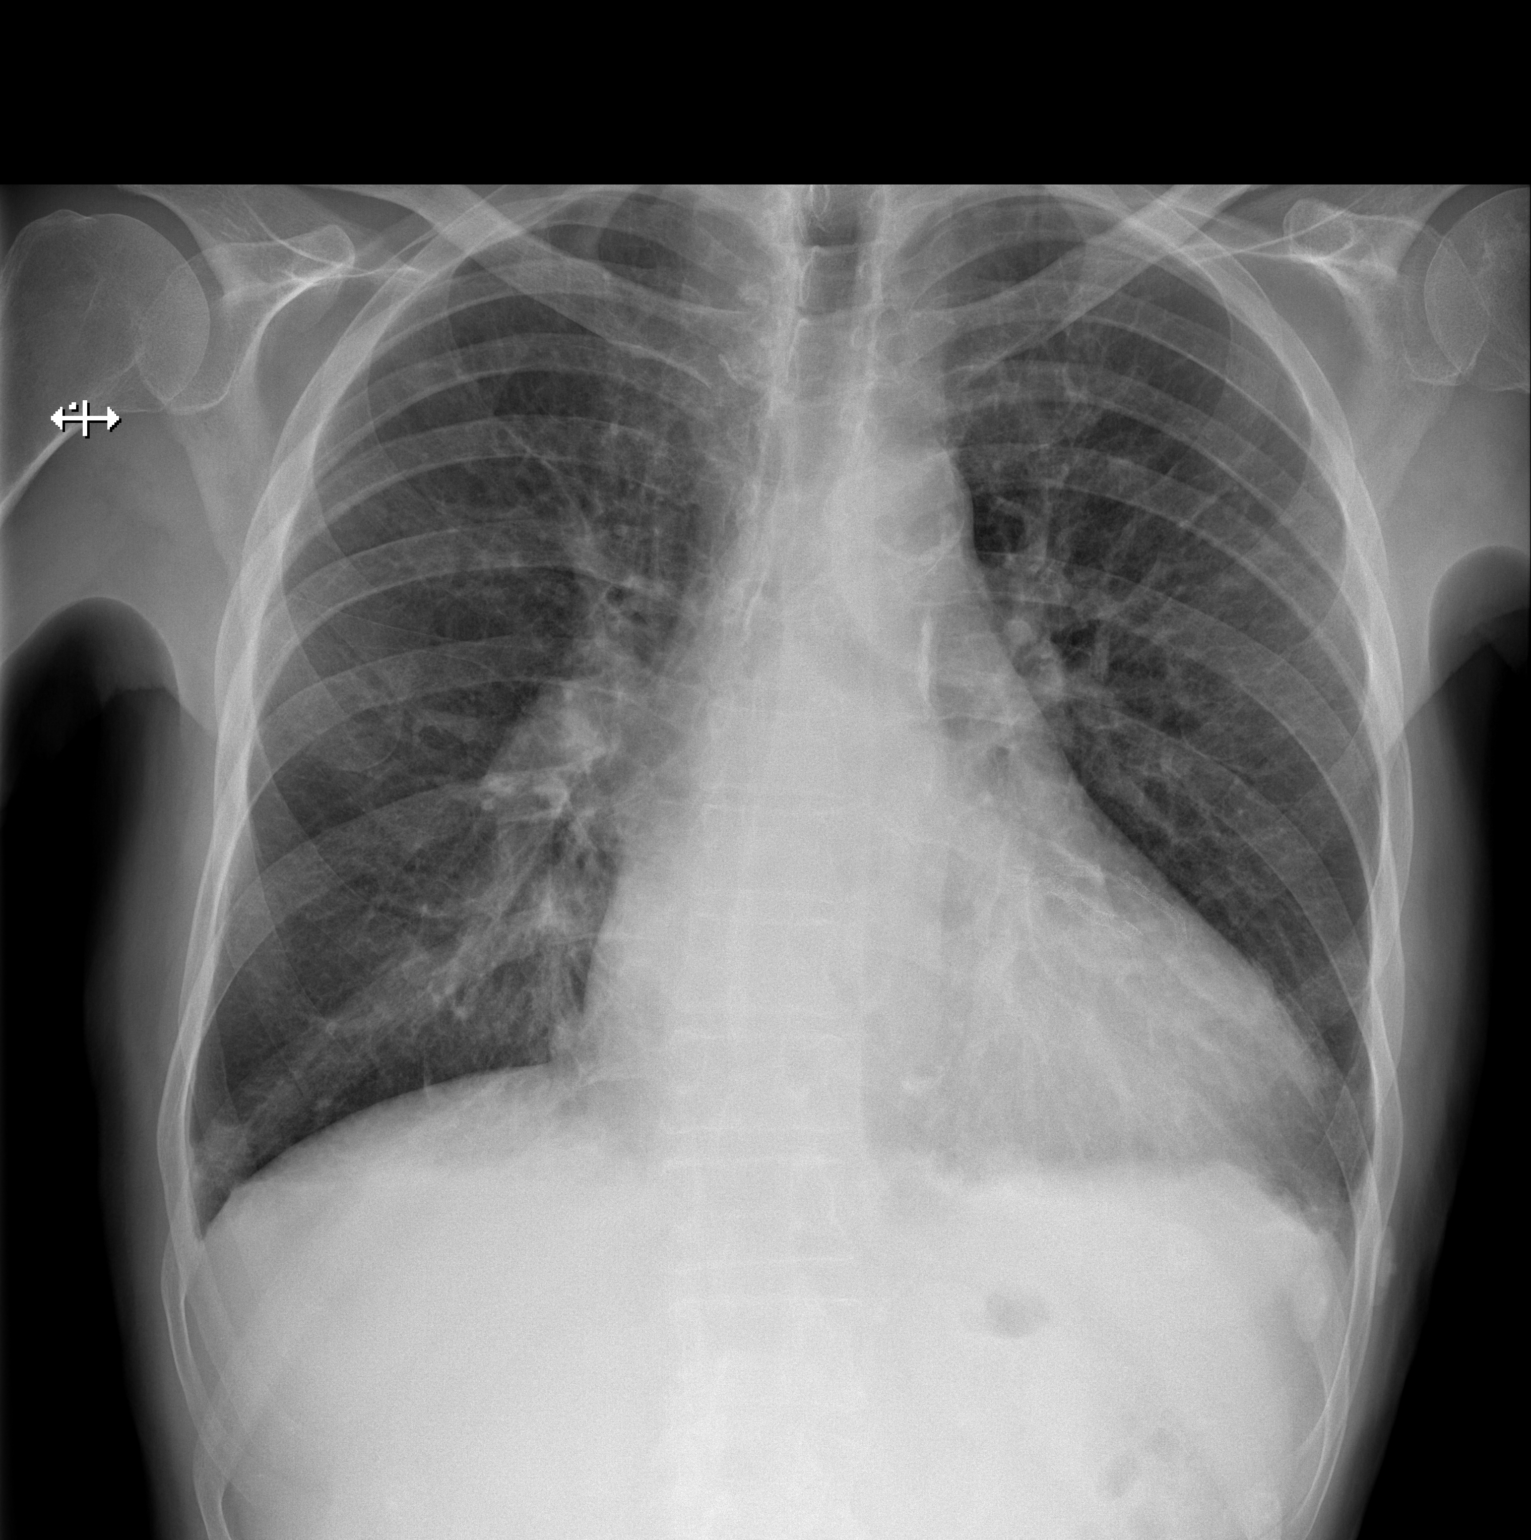

[w chest lat]
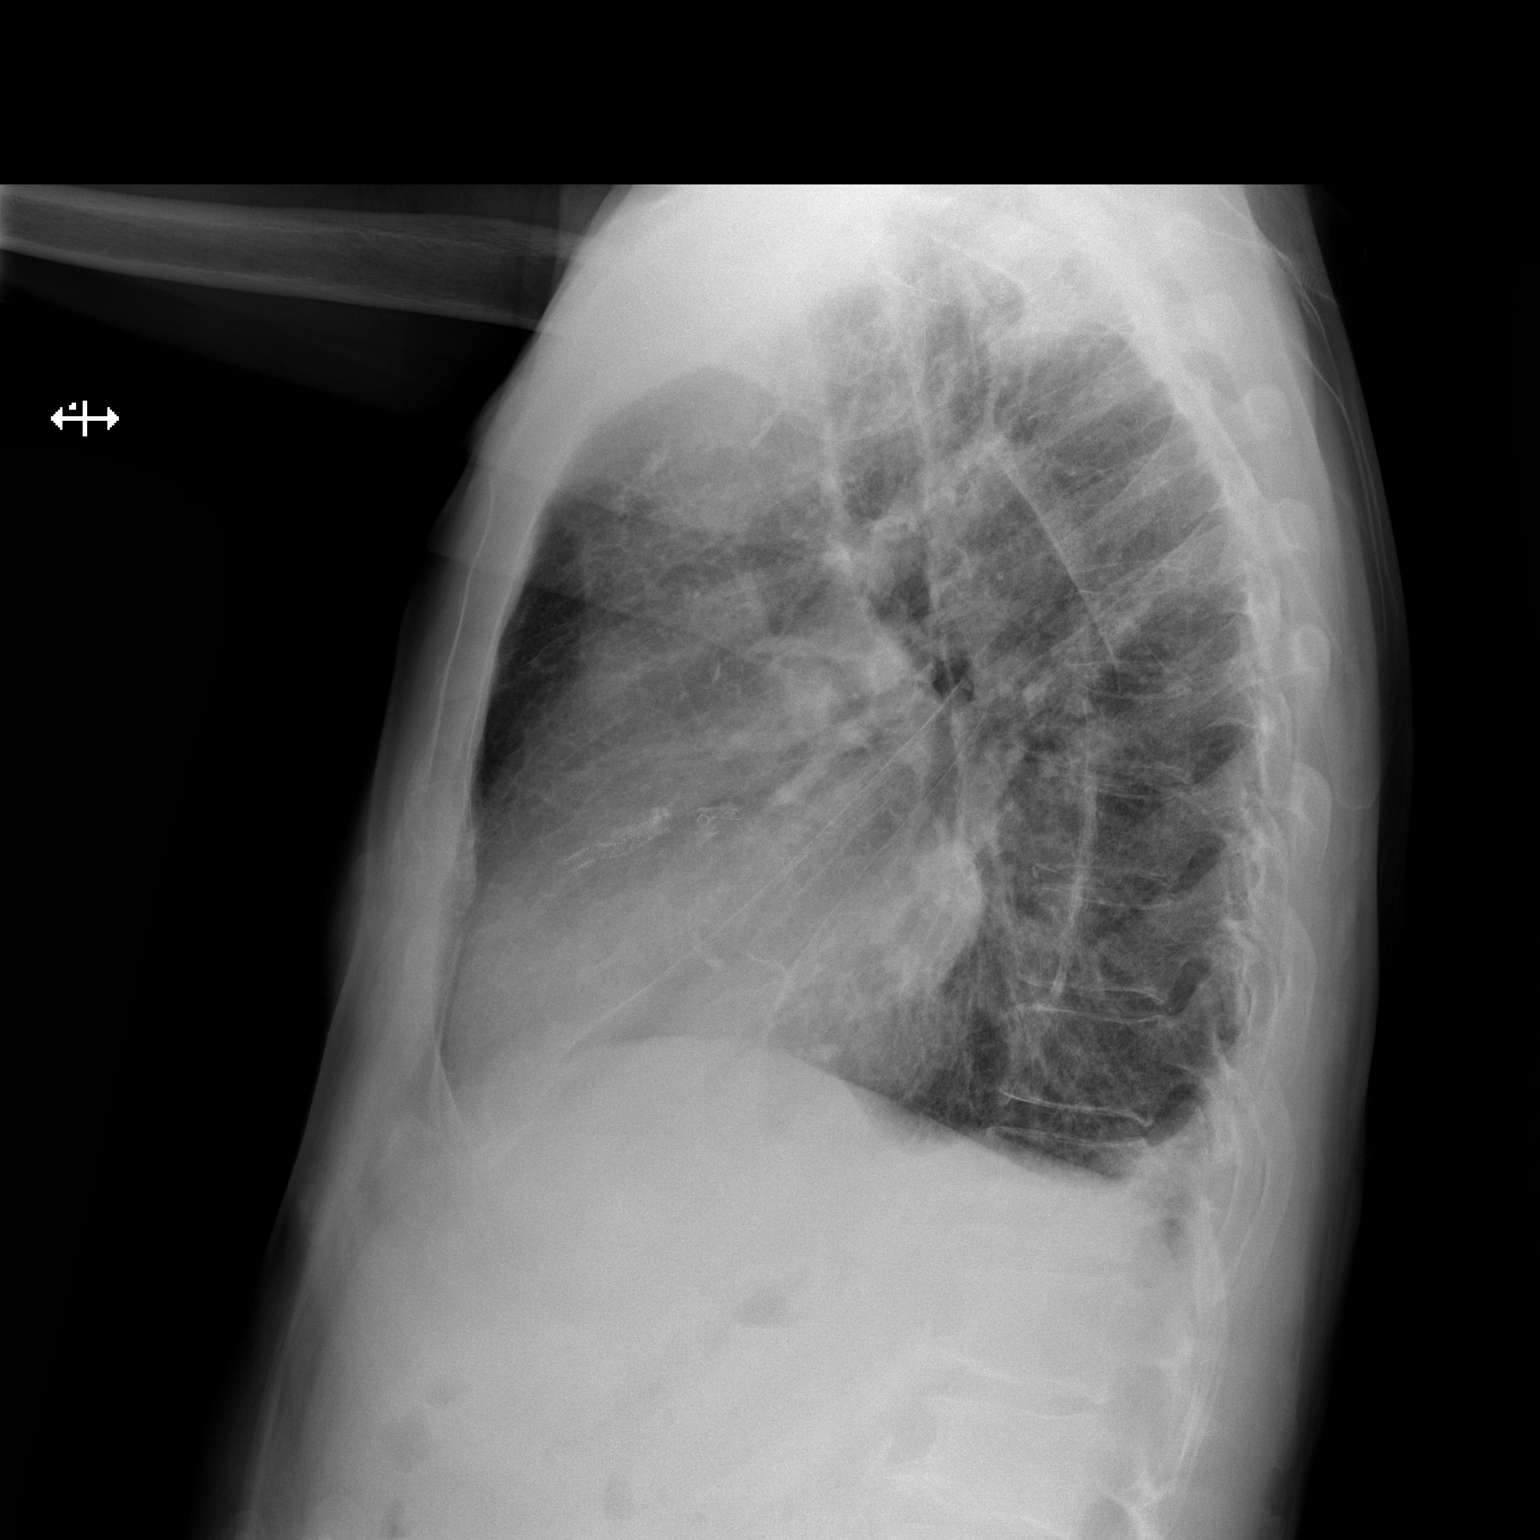

[2 of 2 positions shown; findings below may reference images not displayed]

FINDINGS: Cardiomediastinal silhouette is enlarged. Mediastinal contours
appear intact. Thoracic aorta is torturous and contains
atherosclerotic calcifications. Coronary artery stents are seen.

There is no evidence of pneumothorax. There are small bilateral
pleural effusions. There is mild coarsening of the interstitial
markings. There is a 15 mm opacity seen overlying the periphery of
the right lower lung on the frontal view.

Osseous structures are without acute abnormality. Soft tissues are
grossly normal.
IMPRESSION: Cardiomegaly.

Small bilateral pleural effusions, and mild pulmonary vascular
congestion.

15 mm soft tissue density overlying the right lower lung. This may
represent an area of round atelectasis, however follow-up with PA
and lateral radiograph, or chest CT, is recommended to assure
resolution, as subpleural soft tissue mass cannot be excluded.

## 2016-10-19 IMAGING — CR DG CHEST 2V
2 series · 2 of 2 positions shown · non-contrast
Comparison: Prior chest x-ray 10/29/2014

CLINICAL DATA: 68-year-old male with congestive heart failure

EXAM:
CHEST  2 VIEW

[chest pa]
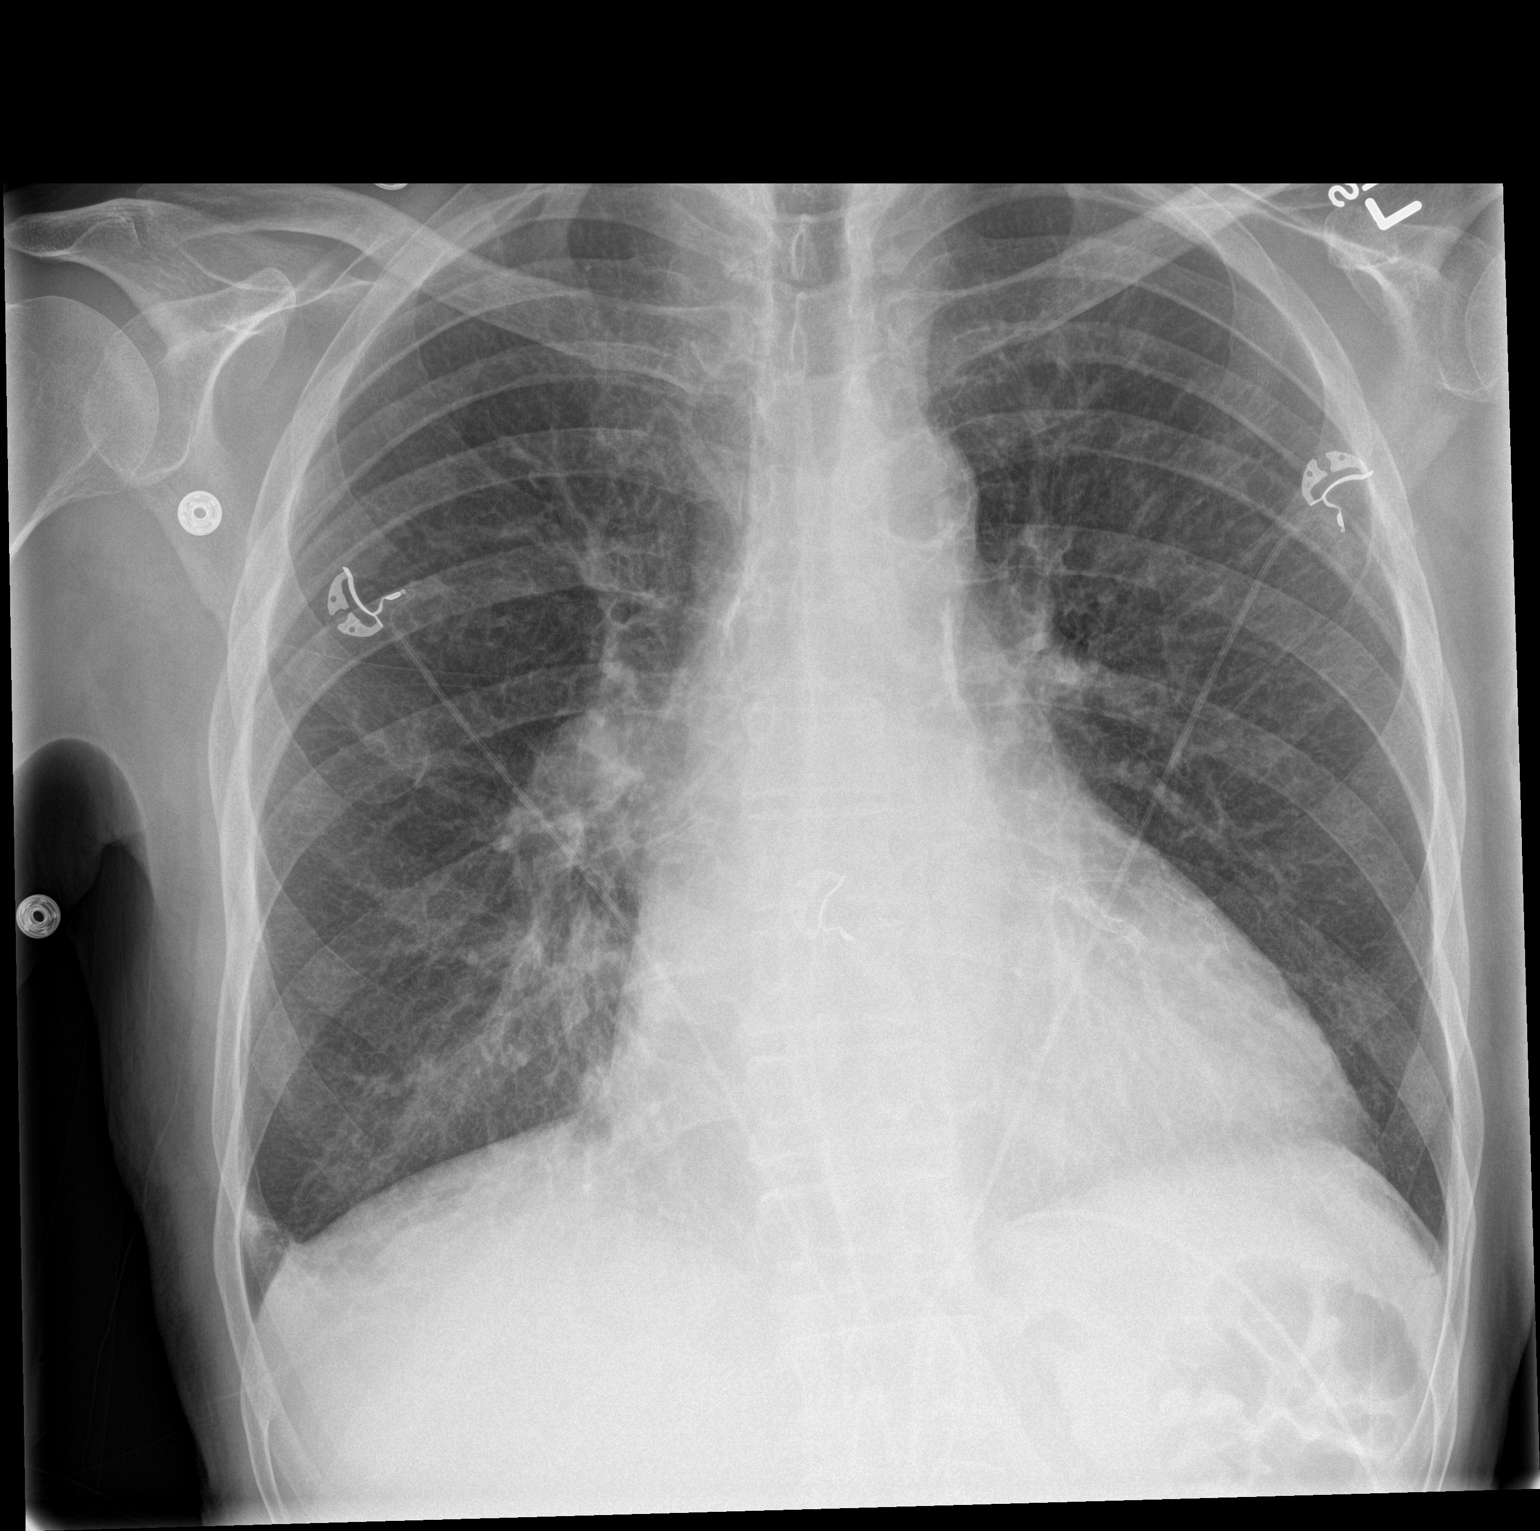

[chest lat]
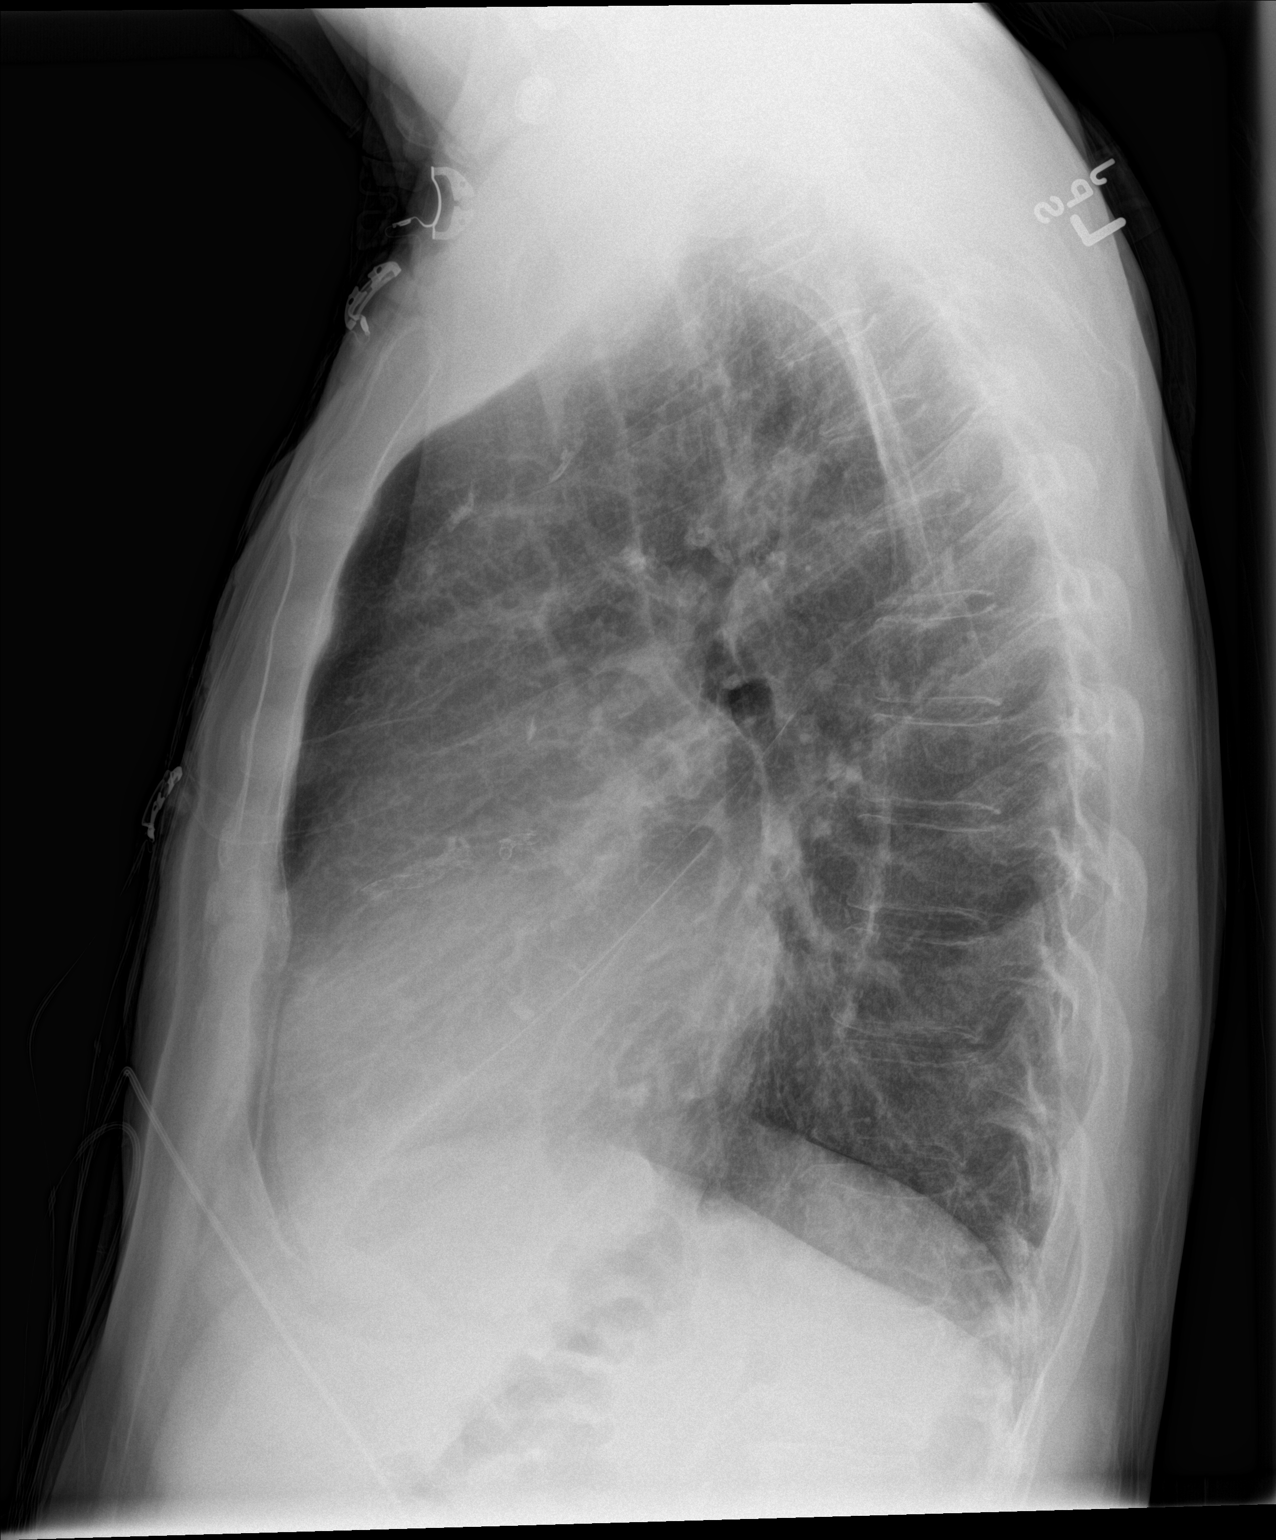

[2 of 2 positions shown; findings below may reference images not displayed]

FINDINGS: Stable cardiomegaly. Metallic stents project over the left heart.
Atherosclerotic calcifications again noted in the transverse aorta.
The lungs are hyperinflated with central bronchitic change and mild
upper lung predominant emphysema. Small bilateral pleural effusions
have largely resolved. Focus of probable pleural parenchymal
scarring in the periphery of the right base appears less nodular on
today's examination. No pulmonary edema. No acute osseous
abnormality.
IMPRESSION: 1. Resolved bilateral pleural effusions.
2. The nodular density in the periphery of the right lung base
appears more linear and scar-like on today's evaluation. Recommend 1
additional follow-up chest x-ray in 4 weeks to exclude a true
underlying pulmonary nodule.
3. Stable cardiomegaly without evidence of pulmonary edema.
